# Patient Record
Sex: Male | Born: 1955 | Race: Black or African American | Hispanic: No | Marital: Married | Smoking: Current every day smoker
Health system: Southern US, Community
[De-identification: ages and names within clinical notes are randomized; demographics above are authoritative.]

## PROBLEM LIST (undated history)

## (undated) DIAGNOSIS — G8929 Other chronic pain: Secondary | ICD-10-CM

## (undated) DIAGNOSIS — M549 Dorsalgia, unspecified: Secondary | ICD-10-CM

## (undated) DIAGNOSIS — M4722 Other spondylosis with radiculopathy, cervical region: Secondary | ICD-10-CM

## (undated) DIAGNOSIS — I10 Essential (primary) hypertension: Secondary | ICD-10-CM

## (undated) DIAGNOSIS — K759 Inflammatory liver disease, unspecified: Secondary | ICD-10-CM

## (undated) DIAGNOSIS — M4712 Other spondylosis with myelopathy, cervical region: Secondary | ICD-10-CM

## (undated) DIAGNOSIS — R531 Weakness: Secondary | ICD-10-CM

## (undated) DIAGNOSIS — M2540 Effusion, unspecified joint: Secondary | ICD-10-CM

## (undated) HISTORY — PX: COLONOSCOPY: SHX174

---

## 2004-09-01 ENCOUNTER — Emergency Department (HOSPITAL_COMMUNITY): Admission: EM | Admit: 2004-09-01 | Discharge: 2004-09-01 | Payer: Self-pay | Admitting: Emergency Medicine

## 2007-01-07 ENCOUNTER — Ambulatory Visit: Payer: Self-pay | Admitting: Gastroenterology

## 2007-01-18 ENCOUNTER — Ambulatory Visit: Payer: Self-pay | Admitting: Gastroenterology

## 2009-09-25 HISTORY — PX: KNEE ARTHROSCOPY: SUR90

## 2009-09-25 HISTORY — PX: LUMBAR SPINE SURGERY: SHX701

## 2012-01-04 ENCOUNTER — Ambulatory Visit: Payer: BC Managed Care – PPO

## 2012-01-04 ENCOUNTER — Ambulatory Visit (INDEPENDENT_AMBULATORY_CARE_PROVIDER_SITE_OTHER): Payer: BC Managed Care – PPO | Admitting: Internal Medicine

## 2012-01-04 ENCOUNTER — Encounter: Payer: Self-pay | Admitting: Internal Medicine

## 2012-01-04 VITALS — BP 159/94 | HR 81 | Temp 98.3°F | Resp 18 | Ht 69.0 in | Wt 146.0 lb

## 2012-01-04 DIAGNOSIS — I10 Essential (primary) hypertension: Secondary | ICD-10-CM

## 2012-01-04 DIAGNOSIS — R030 Elevated blood-pressure reading, without diagnosis of hypertension: Secondary | ICD-10-CM

## 2012-01-04 DIAGNOSIS — F17200 Nicotine dependence, unspecified, uncomplicated: Secondary | ICD-10-CM | POA: Insufficient documentation

## 2012-01-04 DIAGNOSIS — Z0000 Encounter for general adult medical examination without abnormal findings: Secondary | ICD-10-CM

## 2012-01-04 DIAGNOSIS — J449 Chronic obstructive pulmonary disease, unspecified: Secondary | ICD-10-CM

## 2012-01-04 LAB — COMPREHENSIVE METABOLIC PANEL
AST: 34 U/L (ref 0–37)
Albumin: 4.3 g/dL (ref 3.5–5.2)
Alkaline Phosphatase: 57 U/L (ref 39–117)
BUN: 12 mg/dL (ref 6–23)
Potassium: 4.4 mEq/L (ref 3.5–5.3)
Sodium: 140 mEq/L (ref 135–145)

## 2012-01-04 LAB — POCT CBC
Lymph, poc: 1.8 (ref 0.6–3.4)
MCH, POC: 28.6 pg (ref 27–31.2)
MCHC: 32.8 g/dL (ref 31.8–35.4)
MID (cbc): 0.5 (ref 0–0.9)
MPV: 9.3 fL (ref 0–99.8)
POC MID %: 9.6 %M (ref 0–12)
Platelet Count, POC: 231 10*3/uL (ref 142–424)
RDW, POC: 14.1 %
WBC: 5.2 10*3/uL (ref 4.6–10.2)

## 2012-01-04 LAB — LIPID PANEL
Cholesterol: 160 mg/dL (ref 0–200)
Total CHOL/HDL Ratio: 1.9 Ratio
VLDL: 8 mg/dL (ref 0–40)

## 2012-01-04 LAB — POCT URINALYSIS DIPSTICK
Blood, UA: NEGATIVE
Glucose, UA: NEGATIVE
Leukocytes, UA: NEGATIVE
Nitrite, UA: NEGATIVE
Urobilinogen, UA: 0.2

## 2012-01-04 LAB — PULMONARY FUNCTION TEST

## 2012-01-04 LAB — POCT UA - MICROSCOPIC ONLY
Casts, Ur, LPF, POC: NEGATIVE
Yeast, UA: NEGATIVE

## 2012-01-04 MED ORDER — AMLODIPINE BESYLATE 10 MG PO TABS: 10.0000 mg | ORAL_TABLET | Freq: Every day | ORAL | Status: DC

## 2012-01-04 NOTE — Patient Instructions (Signed)
Chronic Obstructive Pulmonary Disease Chronic obstructive pulmonary disease (COPD) is a condition in which airflow from the lungs is restricted. The lungs can never return to normal, but there are measures you can take which will improve them and make you feel better. CAUSES   Smoking.   Exposure to secondhand smoke.   Breathing in irritants (pollution, cigarette smoke, strong smells, aerosol sprays, paint fumes).   History of lung infections.  TREATMENT  Treatment focuses on making you comfortable (supportive care). Your caregiver may prescribe medications (inhaled or pills) to help improve your breathing. HOME CARE INSTRUCTIONS   If you smoke, stop smoking.   Avoid exposure to smoke, chemicals, and fumes that aggravate your breathing.   Take antibiotic medicines as directed by your caregiver.   Avoid medicines that dry up your system and slow down the elimination of secretions (antihistamines and cough syrups). This decreases respiratory capacity and may lead to infections.   Drink enough water and fluids to keep your urine clear or pale yellow. This loosens secretions.   Use humidifiers at home and at your bedside if they do not make breathing difficult.   Receive all protective vaccines your caregiver suggests, especially pneumococcal and influenza.   Use home oxygen as suggested.   Stay active. Exercise and physical activity will help maintain your ability to do things you want to do.   Eat a healthy diet.  SEEK MEDICAL CARE IF:   You develop pus-like mucus (sputum).   Breathing is more labored or exercise becomes difficult to do.   You are running out of the medicine you take for your breathing.  SEEK IMMEDIATE MEDICAL CARE IF:   You have a rapid heart rate.   You have agitation, confusion, tremors, or are in a stupor (family members may need to observe this).   It becomes difficult to breathe.   You develop chest pain.   You have a fever.  MAKE SURE YOU:    Understand these instructions.   Will watch your condition.   Will get help right away if you are not doing well or get worse.  Document Released: 06/21/2005 Document Revised: 08/31/2011 Document Reviewed: 11/11/2010 [facility] Patient Information 2012 [place], [place].Chronic Obstructive Pulmonary Disease Chronic obstructive pulmonary disease (COPD) is a condition in which airflow from the lungs is restricted. The lungs can never return to normal, but there are measures you can take which will improve them and make you feel better. CAUSES   Smoking.   Exposure to secondhand smoke.   Breathing in irritants (pollution, cigarette smoke, strong smells, aerosol sprays, paint fumes).   History of lung infections.  TREATMENT  Treatment focuses on making you comfortable (supportive care). Your caregiver may prescribe medications (inhaled or pills) to help improve your breathing. HOME CARE INSTRUCTIONS   If you smoke, stop smoking.   Avoid exposure to smoke, chemicals, and fumes that aggravate your breathing.   Take antibiotic medicines as directed by your caregiver.   Avoid medicines that dry up your system and slow down the elimination of secretions (antihistamines and cough syrups). This decreases respiratory capacity and may lead to infections.   Drink enough water and fluids to keep your urine clear or pale yellow. This loosens secretions.   Use humidifiers at home and at your bedside if they do not make breathing difficult.   Receive all protective vaccines your caregiver suggests, especially pneumococcal and influenza.   Use home oxygen as suggested.   Stay active. Exercise and physical activity will  help maintain your ability to do things you want to do.   Eat a healthy diet.  SEEK MEDICAL CARE IF:   You develop pus-like mucus (sputum).   Breathing is more labored or exercise becomes difficult to do.   You are running out of the medicine you take for your breathing.   SEEK IMMEDIATE MEDICAL CARE IF:   You have a rapid heart rate.   You have agitation, confusion, tremors, or are in a stupor (family members may need to observe this).   It becomes difficult to breathe.   You develop chest pain.   You have a fever.  MAKE SURE YOU:   Understand these instructions.   Will watch your condition.   Will get help right away if you are not doing well or get worse.  Document Released: 06/21/2005 Document Revised: 08/31/2011 Document Reviewed: 11/11/2010 [facility] Patient Information 2012 [place], [place].DASH Diet The DASH diet stands for "Dietary Approaches to Stop Hypertension." It is a healthy eating plan that has been shown to reduce high blood pressure (hypertension) in as little as 14 days, while also possibly providing other significant health benefits. These other health benefits include reducing the risk of breast cancer after menopause and reducing the risk of type 2 diabetes, heart disease, colon cancer, and stroke. Health benefits also include weight loss and slowing kidney failure in patients with chronic kidney disease.  DIET GUIDELINES  Limit salt (sodium). Your diet should contain less than 1500 mg of sodium daily.   Limit refined or processed carbohydrates. Your diet should include mostly whole grains. Desserts and added sugars should be used sparingly.   Include small amounts of heart-healthy fats. These types of fats include nuts, oils, and tub margarine. Limit saturated and trans fats. These fats have been shown to be harmful in the body.  CHOOSING FOODS  The following food groups are based on a 2000 calorie diet. See your Registered Dietitian for individual calorie needs. Grains and Grain Products (6 to 8 servings daily)  Eat More Often: Whole-wheat bread, brown rice, whole-grain or wheat pasta, quinoa, popcorn without added fat or salt (air popped).   Eat Less Often: White bread, white pasta, white rice, cornbread.  Vegetables (4 to 5  servings daily)  Eat More Often: Fresh, frozen, and canned vegetables. Vegetables may be raw, steamed, roasted, or grilled with a minimal amount of fat.   Eat Less Often/Avoid: Creamed or fried vegetables. Vegetables in a cheese sauce.  Fruit (4 to 5 servings daily)  Eat More Often: All fresh, canned (in natural juice), or frozen fruits. Dried fruits without added sugar. One hundred percent fruit juice ( cup [237 mL] daily).   Eat Less Often: Dried fruits with added sugar. Canned fruit in light or heavy syrup.  [employer], Fish, and Poultry (2 servings or less daily. One serving is 3 to 4 oz [85-114 g]).  Eat More Often: Ninety percent or leaner ground beef, tenderloin, sirloin. Round cuts of beef, chicken breast, [place] breast. All fish. Grill, bake, or broil your meat. Nothing should be fried.   Eat Less Often/Avoid: Fatty cuts of meat, [place], or chicken leg, thigh, or wing. Fried cuts of meat or fish.  Dairy (2 to 3 servings)  Eat More Often: Low-fat or fat-free milk, low-fat plain or light yogurt, reduced-fat or part-skim cheese.   Eat Less Often/Avoid: Milk (whole, 2%, skim, or chocolate).Whole milk yogurt. Full-fat cheeses.  Nuts, Seeds, and Legumes (4 to 5 servings per week)  Eat  More Often: All without added salt.   Eat Less Often/Avoid: Salted nuts and seeds, canned beans with added salt.  Fats and Sweets (limited)  Eat More Often: Vegetable oils, tub margarines without trans fats, sugar-free gelatin. Mayonnaise and salad dressings.   Eat Less Often/Avoid: Coconut oils, palm oils, butter, stick margarine, cream, half and half, cookies, candy, pie.  FOR MORE INFORMATION The Dash Diet Eating Plan: www.dashdiet.org Document Released: 08/31/2011 Document Reviewed: 08/21/2011 [facility] Patient Information 2012 [place], [place].Hypertension As your heart beats, it forces blood through your arteries. This force is your blood pressure. If the pressure is too high, it is called  hypertension (HTN) or high blood pressure. HTN is dangerous because you may have it and not know it. High blood pressure may mean that your heart has to work harder to pump blood. Your arteries may be narrow or stiff. The extra work puts you at risk for heart disease, stroke, and other problems.  Blood pressure consists of two numbers, a higher number over a lower, 110/72, for example. It is stated as "110 over 72." The ideal is below 120 for the top number (systolic) and under 80 for the bottom (diastolic). Write down your blood pressure today. You should pay close attention to your blood pressure if you have certain conditions such as:  Heart failure.   Prior heart attack.   Diabetes   Chronic kidney disease.   Prior stroke.   Multiple risk factors for heart disease.  To see if you have HTN, your blood pressure should be measured while you are seated with your arm held at the level of the heart. It should be measured at least twice. A one-time elevated blood pressure reading (especially in the Emergency Department) does not mean that you need treatment. There may be conditions in which the blood pressure is different between your right and left arms. It is important to see your caregiver soon for a recheck. Most people have essential hypertension which means that there is not a specific cause. This type of high blood pressure may be lowered by changing lifestyle factors such as:  Stress.   Smoking.   Lack of exercise.   Excessive weight.   Drug/tobacco/alcohol use.   Eating less salt.  Most people do not have symptoms from high blood pressure until it has caused damage to the body. Effective treatment can often prevent, delay or reduce that damage. TREATMENT  When a cause has been identified, treatment for high blood pressure is directed at the cause. There are a large number of medications to treat HTN. These fall into several categories, and your caregiver will help you select the  medicines that are best for you. Medications may have side effects. You should review side effects with your caregiver. If your blood pressure stays high after you have made lifestyle changes or started on medicines,   Your medication(s) may need to be changed.   Other problems may need to be addressed.   Be certain you understand your prescriptions, and know how and when to take your medicine.   Be sure to follow up with your caregiver within the time frame advised (usually within two weeks) to have your blood pressure rechecked and to review your medications.   If you are taking more than one medicine to lower your blood pressure, make sure you know how and at what times they should be taken. Taking two medicines at the same time can result in blood pressure that is too low.  SEEK IMMEDIATE MEDICAL CARE IF:  You develop a severe headache, blurred or changing vision, or confusion.   You have unusual weakness or numbness, or a faint feeling.   You have severe chest or abdominal pain, vomiting, or breathing problems.  MAKE SURE YOU:   Understand these instructions.   Will watch your condition.   Will get help right away if you are not doing well or get worse.  Document Released: 09/11/2005 Document Revised: 08/31/2011 Document Reviewed: 05/01/2008 [facility] Patient Information 2012 [place], [place].

## 2012-01-04 NOTE — Progress Notes (Signed)
Subjective:    Patient ID: David Keith, male    DOB: [birth year], 56 y.o.   MRN: [id number]  HPI  56 y/o male with elevated BP and hx of smoking presents feeling great for CPE. Ready to quit smoking again. Jonts ache in many places but does not slow him down. See scanned hx  Review of Systems    see scanned ROS Objective:   Physical Exam  Constitutional: He is oriented to person, place, and time. He appears well-developed and well-nourished.  HENT:  Right Ear: External ear normal.  Left Ear: External ear normal.  Nose: Nose normal.  Eyes: EOM are normal. Pupils are equal, round, and reactive to light.  Neck: Normal range of motion. Neck supple. No thyromegaly present.  Cardiovascular: Normal rate, regular rhythm and normal heart sounds.   Pulmonary/Chest: Effort normal. Not tachypneic. No respiratory distress. He has decreased breath sounds. He has rhonchi.  Abdominal: Soft. Bowel sounds are normal. He exhibits no distension and no mass.  Genitourinary: Rectum normal, prostate normal and penis normal.  Musculoskeletal: He exhibits tenderness.  Lymphadenopathy:    He has no cervical adenopathy.  Neurological: He is alert and oriented to person, place, and time. He has normal reflexes. Coordination normal.  Skin: Skin is warm and dry.  Psychiatric: He has a normal mood and affect.   [facility] reading (PRIMARY) by  Dr.Travius Crochet hyperinflation Results for orders placed in visit on 01/04/12  POCT CBC      Component Value Range   WBC 5.2  4.6 - 10.2 (K/uL)   Lymph, poc 1.8  0.6 - 3.4    POC LYMPH PERCENT 34.0  10 - 50 (%L)   MID (cbc) 0.5  0 - 0.9    POC MID % 9.6  0 - 12 (%M)   POC Granulocyte 2.9  2 - 6.9    Granulocyte percent 56.4  37 - 80 (%G)   RBC 4.69  4.69 - 6.13 (M/uL)   Hemoglobin 13.4 (*) 14.1 - 18.1 (g/dL)   HCT, POC 32.4 (*) 40.1 - 53.7 (%)   MCV 86.9  80 - 97 (fL)   MCH, POC 28.6  27 - 31.2 (pg)   MCHC 32.8  31.8 - 35.4 (g/dL)   RDW, POC 02.7     Platelet  Count, POC 231  142 - 424 (K/uL)   MPV 9.3  0 - 99.8 (fL)  POCT UA - MICROSCOPIC ONLY      Component Value Range   WBC, Ur, HPF, POC 0-1     RBC, urine, microscopic 0-1     Bacteria, U Microscopic neg     Mucus, UA neg     Epithelial cells, urine per micros 0-1     Crystals, Ur, HPF, POC neg     Casts, Ur, LPF, POC neg     Yeast, UA neg    POCT URINALYSIS DIPSTICK      Component Value Range   Color, UA yellow     Clarity, UA clear     Glucose, UA neg     Bilirubin, UA neg     Ketones, UA neg     Spec Grav, UA 1.015     Blood, UA neg     pH, UA 5.5     Protein, UA neg     Urobilinogen, UA 0.2     Nitrite, UA neg     Leukocytes, UA Negative  Assessment & Plan:  HTN needs rx, will start Amlodipine 10mg  qd Quit smoking will use nicorrete COPD early

## 2012-01-21 ENCOUNTER — Ambulatory Visit (INDEPENDENT_AMBULATORY_CARE_PROVIDER_SITE_OTHER): Payer: BC Managed Care – PPO | Admitting: Family Medicine

## 2012-01-21 VITALS — BP 154/86 | HR 85 | Temp 98.6°F | Resp 16 | Ht 68.58 in | Wt 141.8 lb

## 2012-01-21 DIAGNOSIS — N529 Male erectile dysfunction, unspecified: Secondary | ICD-10-CM

## 2012-01-21 DIAGNOSIS — I10 Essential (primary) hypertension: Secondary | ICD-10-CM | POA: Insufficient documentation

## 2012-01-21 MED ORDER — TADALAFIL 20 MG PO TABS: 10.0000 mg | ORAL_TABLET | ORAL | Status: DC | PRN

## 2012-01-21 MED ORDER — LISINOPRIL 10 MG PO TABS: 10.0000 mg | ORAL_TABLET | Freq: Every day | ORAL | Status: DC

## 2012-01-21 NOTE — Progress Notes (Signed)
  Patient Name: David Keith Date of Birth: [birth year] Medical Record Number: [id number] Gender: male Date of Encounter: 01/21/2012  History of Present Illness:  David Keith is a 56 y.o. very pleasant male patient who presents with the following:  Here to recheck his BP today- see last OV 01/04/12.  He was started on norvasc 10mg  at that visit for newly diagnosedHTN.  He is feeling well so far.  He has not noted any swelling.  He does not check his BP at home, but is taking his medication daily He would like to try an ED medication as well.  He has no CP and does not use nitriglycerin.  Patient Active Problem List  Diagnoses  . Smoking  . Hypertension   No past medical history on file. No past surgical history on file. History  Substance Use Topics  . Smoking status: Current Everyday Smoker -- 1.0 packs/day for 25 years    Types: Cigarettes  . Smokeless tobacco: Not on file   Comment: 3 packs a week  . Alcohol Use: 0.0 oz/week     weekends only   No family history on file. No Known Allergies  Medication list has been reviewed and updated.  Review of Systems: As per HPI- otherwise negative.   Physical Examination: Filed Vitals:   01/21/12 0936  BP: 154/86  Pulse: 85  Temp: 98.6 F (37 C)  TempSrc: Oral  Resp: 16  Height: 5' 8.58" (1.742 m)  Weight: 141 lb 12.8 oz (64.32 kg)    Body mass index is 21.20 kg/(m^2).  GEN: WDWN, NAD, Non-toxic, A & O x 3 HEENT: Atraumatic, Normocephalic. Neck supple. No masses, No LAD. PEERL, oropharynx and TM wnl Ears and Nose: No external deformity. CV: RRR, No M/G/R. No JVD. No thrill. No extra heart sounds. PULM: CTA B, no wheezes, crackles, rhonchi. No retractions. No resp. distress. No accessory muscle use. ABD: S, NT, ND EXTR: No c/c/e NEURO Normal gait.  PSYCH: Normally interactive. Conversant. Not depressed or anxious appearing.  Calm demeanor.    Assessment and Plan: 1. Hypertension  lisinopril  (PRINIVIL,ZESTRIL) 10 MG tablet  2. ED (erectile dysfunction)  tadalafil (CIALIS) 20 MG tablet   BP better but still elevated.  Add lisinopril as above.  Also gave sample coupon for cialis 20mg - try 1/2 or 1 tablet as needed.  If he does well may continue or change to whatever medication is preferred by his insurance plan.  Please call and let us know how he is doing, plan to recheck BP in a couple of months

## 2012-02-17 ENCOUNTER — Telehealth: Payer: Self-pay

## 2012-02-17 NOTE — Telephone Encounter (Signed)
PATIENT STATES HE WAS TOLD TO CALL BACK FROM DR. COPLAND REGARDING HIS BLOOD PRESSURE READING. WHEN HE TOOK IT Friday, IT WAS 135/61 OR 135/81? HE WAS NOT SURE. BEST PHONE [phone]  PHARMACY CHOICE IS [facility] ([place])   MBC

## 2012-02-19 NOTE — Telephone Encounter (Signed)
Called and LMOM- it sounds like his BP control is fine.  Take care and please come see [place] in about one month for labs and BP recheck.

## 2012-05-11 ENCOUNTER — Telehealth: Payer: Self-pay

## 2012-05-11 NOTE — Telephone Encounter (Signed)
Patient states he was given 20 mg sample of Cialis and it is 'not doing the trick'. Patient would like a call back and perhaps a higher dose? [facility] [address] Veterinary surgeon.

## 2012-05-13 MED ORDER — SILDENAFIL CITRATE 100 MG PO TABS: 50.0000 mg | ORAL_TABLET | Freq: Every day | ORAL | Status: DC | PRN

## 2012-05-13 NOTE — Telephone Encounter (Signed)
That is the highest dose of Cialis. Has he had luck with any other medication? Viagra? Is the medication helping at all?

## 2012-05-13 NOTE — Telephone Encounter (Signed)
Rx for viagra sent to pharmacy. If this does not work for him, may need office visit to re-evaluate

## 2012-05-13 NOTE — Telephone Encounter (Signed)
I have called patient back to advise this is the only one of these medications he has tried, I have told him we may need to try a different one of these and send in please advise.

## 2012-05-14 NOTE — Telephone Encounter (Signed)
LMOM for pt to CB.  

## 2012-05-16 NOTE — Telephone Encounter (Signed)
Pt CB and notified him that Rx was sent in and if it is ineffective he should RTC for re-eval.

## 2012-05-16 NOTE — Telephone Encounter (Signed)
lmom that viagra called in and told him that if this doesn't work, he may need to rtc.

## 2012-12-24 ENCOUNTER — Encounter: Payer: Self-pay | Admitting: Internal Medicine

## 2013-03-22 ENCOUNTER — Other Ambulatory Visit: Payer: Self-pay | Admitting: Family Medicine

## 2013-03-22 ENCOUNTER — Other Ambulatory Visit: Payer: Self-pay | Admitting: Internal Medicine

## 2013-05-13 ENCOUNTER — Other Ambulatory Visit: Payer: Self-pay | Admitting: Physician Assistant

## 2013-08-22 ENCOUNTER — Emergency Department (HOSPITAL_COMMUNITY): Admission: EM | Admit: 2013-08-22 | Discharge: 2013-08-22 | Discharge status: Against Medical Advice | Payer: Self-pay

## 2013-08-22 ENCOUNTER — Other Ambulatory Visit: Payer: Self-pay

## 2013-09-23 ENCOUNTER — Ambulatory Visit (INDEPENDENT_AMBULATORY_CARE_PROVIDER_SITE_OTHER): Payer: BC Managed Care – PPO | Admitting: Emergency Medicine

## 2013-09-23 VITALS — BP 188/120 | HR 87 | Temp 98.1°F | Resp 16 | Ht 68.0 in | Wt 147.0 lb

## 2013-09-23 DIAGNOSIS — F17200 Nicotine dependence, unspecified, uncomplicated: Secondary | ICD-10-CM

## 2013-09-23 DIAGNOSIS — R490 Dysphonia: Secondary | ICD-10-CM

## 2013-09-23 DIAGNOSIS — I10 Essential (primary) hypertension: Secondary | ICD-10-CM

## 2013-09-23 DIAGNOSIS — Z9119 Patient's noncompliance with other medical treatment and regimen: Secondary | ICD-10-CM

## 2013-09-23 DIAGNOSIS — J029 Acute pharyngitis, unspecified: Secondary | ICD-10-CM

## 2013-09-23 MED ORDER — LISINOPRIL 20 MG PO TABS: 20.0000 mg | ORAL_TABLET | Freq: Every day | ORAL | Status: DC

## 2013-09-23 MED ORDER — AMLODIPINE BESYLATE 10 MG PO TABS: 10.0000 mg | ORAL_TABLET | Freq: Every day | ORAL | Status: DC

## 2013-09-23 NOTE — Patient Instructions (Signed)
STOP SMOKING!!!!!! Come back Saturday next week for blood pressure check and labs  Hoarseness Hoarseness is produced from a variety of causes. It is important to find the cause so it can be treated. In the absence of a cold or upper respiratory illness, any hoarseness lasting more than 2 weeks should be looked at by a specialist. This is especially important if you have a history of smoking or alcohol use. It is also important to keep in mind that as you grow older, your voice will naturally get weaker, making it easier for you to become hoarse from straining your vocal cords.  CAUSES  Any illness that affects your vocal cords can result in a hoarse voice. Examples of conditions that can affect the vocal cords are listed as follows:   Allergies.  Colds.  Sinusitis.  Gastroesophageal reflux disease.  Croup.  Injury.  Nodules.  Exposure to smoke or toxic fumes or gases.  Congenital and genetic defects.  Paralysis of the vocal cords.  Infections.  Advanced age. DIAGNOSIS  In order to diagnose the cause of your hoarseness, your caregiver will examine your throat using an instrument that uses a tube with a small lighted camera (laryngoscope). It allows your caregiver to look into the mouth and down the throat. TREATMENT  For most cases, treatment will focus on the specific cause of the hoarseness. Depending on the cause, hoarseness can be a temporary condition (acute) or it can be long lasting (chronic). Most cases of hoarseness clear up without complications. Your caregiver will explain to you if this is not likely to happen. SEEK IMMEDIATE MEDICAL CARE IF:   You have increasing hoarseness or loss of voice.  You have shortness of breath.  You are coughing up blood.  There is pain in your neck or throat. Document Released: 08/25/2005 Document Revised: 12/04/2011 Document Reviewed: 11/17/2010 [facility] Patient Information 2014 [place], [place]. Hypertension As your heart beats,  it forces blood through your arteries. This force is your blood pressure. If the pressure is too high, it is called hypertension (HTN) or high blood pressure. HTN is dangerous because you may have it and not know it. High blood pressure may mean that your heart has to work harder to pump blood. Your arteries may be narrow or stiff. The extra work puts you at risk for heart disease, stroke, and other problems.  Blood pressure consists of two numbers, a higher number over a lower, 110/72, for example. It is stated as "110 over 72." The ideal is below 120 for the top number (systolic) and under 80 for the bottom (diastolic). Write down your blood pressure today. You should pay close attention to your blood pressure if you have certain conditions such as:  Heart failure.  Prior heart attack.  Diabetes  Chronic kidney disease.  Prior stroke.  Multiple risk factors for heart disease. To see if you have HTN, your blood pressure should be measured while you are seated with your arm held at the level of the heart. It should be measured at least twice. A one-time elevated blood pressure reading (especially in the Emergency Department) does not mean that you need treatment. There may be conditions in which the blood pressure is different between your right and left arms. It is important to see your caregiver soon for a recheck. Most people have essential hypertension which means that there is not a specific cause. This type of high blood pressure may be lowered by changing lifestyle factors such as:  Stress.  Smoking.  Lack of exercise.  Excessive weight.  Drug/tobacco/alcohol use.  Eating less salt. Most people do not have symptoms from high blood pressure until it has caused damage to the body. Effective treatment can often prevent, delay or reduce that damage. TREATMENT  When a cause has been identified, treatment for high blood pressure is directed at the cause. There are a large number of  medications to treat HTN. These fall into several categories, and your caregiver will help you select the medicines that are best for you. Medications may have side effects. You should review side effects with your caregiver. If your blood pressure stays high after you have made lifestyle changes or started on medicines,   Your medication(s) may need to be changed.  Other problems may need to be addressed.  Be certain you understand your prescriptions, and know how and when to take your medicine.  Be sure to follow up with your caregiver within the time frame advised (usually within two weeks) to have your blood pressure rechecked and to review your medications.  If you are taking more than one medicine to lower your blood pressure, make sure you know how and at what times they should be taken. Taking two medicines at the same time can result in blood pressure that is too low. SEEK IMMEDIATE MEDICAL CARE IF:  You develop a severe headache, blurred or changing vision, or confusion.  You have unusual weakness or numbness, or a faint feeling.  You have severe chest or abdominal pain, vomiting, or breathing problems. MAKE SURE YOU:   Understand these instructions.  Will watch your condition.  Will get help right away if you are not doing well or get worse. Document Released: 09/11/2005 Document Revised: 12/04/2011 Document Reviewed: 05/01/2008 [facility] Patient Information 2014 [place], [place].

## 2013-09-23 NOTE — Progress Notes (Addendum)
[facility] [address], [place] [address] [phone]  Date:  09/23/2013   Name:  David Keith   DOB:  1956-08-03   MRN:  [id number]  PCP:  Tally Due, MD    Chief Complaint: Sore Throat and Medication Refill   History of Present Illness:  David Keith is a 57 y.o. very pleasant male patient who presents with the following:  Last took blood pressure medication in April.  Ran out.   Says he went to pharmacy and tried recently to get refill and told to come in.  Asymptomatic.  Still smoking but has "cut back".   Now here with sore throat. No coryza.  No wheezing or shortness of breath.  He claims it makes him cough from the dryness in his throat No fever or chills and a non productive cough.  Hoarse.  Started several week ago.  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  No improvement with over the counter medications or other home remedies. Denies other complaint or health concern today.   Patient Active Problem List   Diagnosis Date Noted  . Hypertension 01/21/2012  . Smoking 01/04/2012    History reviewed. No pertinent past medical history.  History reviewed. No pertinent past surgical history.  History  Substance Use Topics  . Smoking status: Current Every Day Smoker -- 1.00 packs/day for 25 years    Types: Cigarettes  . Smokeless tobacco: Not on file     Comment: 3 packs a week  . Alcohol Use: 0.0 oz/week     Comment: weekends only    Family History  Problem Relation Age of Onset  . Cancer Mother   . Heart disease Father   . Diabetes Brother     No Known Allergies  Medication list has been reviewed and updated.  Current Outpatient Prescriptions on File Prior to Visit  Medication Sig Dispense Refill  . amLODipine (NORVASC) 10 MG tablet TAKE 1 TABLET BY MOUTH DAILY  15 tablet  0  . lisinopril (PRINIVIL,ZESTRIL) 10 MG tablet TAKE 1 TABLET DAILY  15 tablet  0  . Multiple Vitamins-Minerals (MULTIVITAMIN PO)  Take 1 tablet by mouth daily.      . sildenafil (VIAGRA) 100 MG tablet Take 0.5-1 tablets (50-100 mg total) by mouth daily as needed for erectile dysfunction.  5 tablet  1  . tadalafil (CIALIS) 20 MG tablet Take 0.5-1 tablets (10-20 mg total) by mouth every other day as needed for erectile dysfunction.  3 tablet  11   No current facility-administered medications on file prior to visit.    Review of Systems:  As per HPI, otherwise negative.    Physical Examination: Filed Vitals:   09/23/13 1737  BP: 188/120  Pulse: 87  Temp: 98.1 F (36.7 C)  Resp: 16   Filed Vitals:   09/23/13 1737  Height: 5\' 8"  (1.727 m)  Weight: 147 lb (66.679 kg)   Body mass index is 22.36 kg/(m^2). Ideal Body Weight: Weight in (lb) to have BMI = 25: 164.1  GEN: WDWN, NAD, Non-toxic, A & O x 3 HEENT: Atraumatic, Normocephalic. Neck supple. No masses, No LAD. Ears and Nose: No external deformity. CV: RRR, No M/G/R. No JVD. No thrill. No extra heart sounds. PULM: CTA B, no wheezes, crackles, rhonchi. No retractions. No resp. distress. No accessory muscle use. ABD: S, NT, ND, +BS. No rebound. No HSM. EXTR: No c/c/e NEURO Normal gait.  PSYCH: Normally interactive.  Conversant. Not depressed or anxious appearing.  Calm demeanor.    Assessment and Plan: Hypertension Noncompliance Hoarseness Tobacco abuse Strep screen  Send to ENT  Signed,  Phillips Odor, MD   Results for orders placed in visit on 09/23/13  POCT RAPID STREP A (OFFICE)      Result Value Range   Rapid Strep A Screen Negative  Negative

## 2013-09-23 NOTE — Addendum Note (Signed)
Addended by: Carmelina Dane on: 09/23/2013 06:57 PM   Modules accepted: Orders

## 2013-10-04 ENCOUNTER — Ambulatory Visit (INDEPENDENT_AMBULATORY_CARE_PROVIDER_SITE_OTHER): Payer: BC Managed Care – PPO | Admitting: Emergency Medicine

## 2013-10-04 VITALS — BP 140/88 | HR 89 | Temp 98.5°F | Resp 12 | Ht 65.25 in | Wt 158.0 lb

## 2013-10-04 DIAGNOSIS — I10 Essential (primary) hypertension: Secondary | ICD-10-CM

## 2013-10-04 DIAGNOSIS — J449 Chronic obstructive pulmonary disease, unspecified: Secondary | ICD-10-CM

## 2013-10-04 DIAGNOSIS — R490 Dysphonia: Secondary | ICD-10-CM

## 2013-10-04 LAB — COMPREHENSIVE METABOLIC PANEL
ALK PHOS: 56 U/L (ref 39–117)
ALT: 24 U/L (ref 0–53)
AST: 31 U/L (ref 0–37)
Albumin: 4.2 g/dL (ref 3.5–5.2)
BILIRUBIN TOTAL: 0.5 mg/dL (ref 0.3–1.2)
BUN: 12 mg/dL (ref 6–23)
CO2: 30 mEq/L (ref 19–32)
CREATININE: 0.86 mg/dL (ref 0.50–1.35)
Calcium: 9.8 mg/dL (ref 8.4–10.5)
Chloride: 106 mEq/L (ref 96–112)
Glucose, Bld: 111 mg/dL — ABNORMAL HIGH (ref 70–99)
Potassium: 5 mEq/L (ref 3.5–5.3)
SODIUM: 140 meq/L (ref 135–145)
TOTAL PROTEIN: 6.8 g/dL (ref 6.0–8.3)

## 2013-10-04 LAB — LIPID PANEL
CHOL/HDL RATIO: 2.2 ratio
Cholesterol: 147 mg/dL (ref 0–200)
HDL: 67 mg/dL (ref >39)
LDL CALC: 71 mg/dL (ref 0–99)
TRIGLYCERIDES: 47 mg/dL (ref <150)
VLDL: 9 mg/dL (ref 0–40)

## 2013-10-04 LAB — POCT CBC
Granulocyte percent: 64.6 %G (ref 37–80)
HEMATOCRIT: 43.1 % — AB (ref 43.5–53.7)
Hemoglobin: 13.8 g/dL — AB (ref 14.1–18.1)
LYMPH, POC: 1.2 (ref 0.6–3.4)
MCH: 29.4 pg (ref 27–31.2)
MCHC: 32 g/dL (ref 31.8–35.4)
MCV: 91.8 fL (ref 80–97)
MID (cbc): 0.3 (ref 0–0.9)
MPV: 9.4 fL (ref 0–99.8)
POC Granulocyte: 2.8 (ref 2–6.9)
POC LYMPH %: 27.6 % (ref 10–50)
POC MID %: 7.8 %M (ref 0–12)
Platelet Count, POC: 220 10*3/uL (ref 142–424)
RBC: 4.7 M/uL (ref 4.69–6.13)
RDW, POC: 15.5 %
WBC: 4.4 10*3/uL — AB (ref 4.6–10.2)

## 2013-10-04 LAB — POCT UA - MICROSCOPIC ONLY
Bacteria, U Microscopic: NEGATIVE
CRYSTALS, UR, HPF, POC: NEGATIVE
Casts, Ur, LPF, POC: NEGATIVE
RBC, urine, microscopic: NEGATIVE
YEAST UA: NEGATIVE

## 2013-10-04 LAB — PSA: PSA: 0.14 ng/mL (ref <=4.00)

## 2013-10-04 NOTE — Patient Instructions (Signed)

## 2013-10-04 NOTE — Progress Notes (Signed)
[facility] [address], [place] [id number] [phone]  Date:  10/04/2013   Name:  David CamaraVincent G Keith   DOB:  08-Mar-1956   MRN:  [id number]  PCP:  Tally DueGUEST, CHRIS WARREN, MD    Chief Complaint: Follow-up   History of Present Illness:  David Keith is a 58 y.o. very pleasant male patient who presents with the following:  Comes in for BP check and labs.  Did not take medication this morning.  No end organ complaints.  Denies other complaint or health concern today.   Patient Active Problem List   Diagnosis Date Noted  . Hypertension 01/21/2012  . Smoking 01/04/2012    No past medical history on file.  No past surgical history on file.  History  Substance Use Topics  . Smoking status: Current Every Day Smoker -- 1.00 packs/day for 25 years    Types: Cigarettes  . Smokeless tobacco: Never Used     Comment: 3 packs a week  . Alcohol Use: 0.0 oz/week     Comment: weekends only    Family History  Problem Relation Age of Onset  . Cancer Mother   . Heart disease Father   . Diabetes Brother     No Known Allergies  Medication list has been reviewed and updated.  Current Outpatient Prescriptions on File Prior to Visit  Medication Sig Dispense Refill  . amLODipine (NORVASC) 10 MG tablet Take 1 tablet (10 mg total) by mouth daily.  30 tablet  0  . lisinopril (PRINIVIL,ZESTRIL) 20 MG tablet Take 1 tablet (20 mg total) by mouth daily.  30 tablet  0  . Multiple Vitamins-Minerals (MULTIVITAMIN PO) Take 1 tablet by mouth daily.      . sildenafil (VIAGRA) 100 MG tablet Take 0.5-1 tablets (50-100 mg total) by mouth daily as needed for erectile dysfunction.  5 tablet  1  . tadalafil (CIALIS) 20 MG tablet Take 0.5-1 tablets (10-20 mg total) by mouth every other day as needed for erectile dysfunction.  3 tablet  11   No current facility-administered medications on file prior to visit.    Review of Systems:  As per HPI, otherwise negative.     Physical Examination: Filed Vitals:   10/04/13 0814  BP: 140/88  Pulse: 89  Temp: 98.5 F (36.9 C)  Resp: 12   Filed Vitals:   10/04/13 0814  Height: 5' 5.25" (1.657 m)  Weight: 158 lb (71.668 kg)   Body mass index is 26.1 kg/(m^2). Ideal Body Weight: Weight in (lb) to have BMI = 25: 151.1  GEN: WDWN, NAD, Non-toxic, A & O x 3 HEENT: Atraumatic, Normocephalic. Neck supple. No masses, No LAD. Ears and Nose: No external deformity. CV: RRR, No M/G/R. No JVD. No thrill. No extra heart sounds. PULM: CTA B, no wheezes, crackles, rhonchi. No retractions. No resp. distress. No accessory muscle use. ABD: S, NT, ND, +BS. No rebound. No HSM. EXTR: No c/c/e NEURO Normal gait.  PSYCH: Normally interactive. Conversant. Not depressed or anxious appearing.  Calm demeanor.    Assessment and Plan: Hoarseness that he relates to dust exposure at work Hy[employer]   Signed,  Phillips OdorJeffery Oland Arquette, MD

## 2013-11-16 ENCOUNTER — Other Ambulatory Visit: Payer: Self-pay | Admitting: Emergency Medicine

## 2013-11-16 ENCOUNTER — Other Ambulatory Visit: Payer: Self-pay | Admitting: Physician Assistant

## 2013-11-17 NOTE — Telephone Encounter (Signed)
Dr Dareen PianoAnderson, do you want to RF this for pt?

## 2013-11-26 ENCOUNTER — Telehealth: Payer: Self-pay

## 2013-11-26 NOTE — Telephone Encounter (Signed)
Pt states he picked up his bp meds and his Viagra at the pharmacy and cannot locate them anywhere???  He states any messages must be left on his cell phone as he cannot receive calls on his job.   Best phone for pt is [phone]

## 2013-11-27 ENCOUNTER — Other Ambulatory Visit: Payer: Self-pay | Admitting: Emergency Medicine

## 2013-11-27 MED ORDER — SILDENAFIL CITRATE 100 MG PO TABS: ORAL_TABLET | ORAL | Status: DC

## 2013-11-27 MED ORDER — AMLODIPINE BESYLATE 10 MG PO TABS: ORAL_TABLET | ORAL | Status: DC

## 2013-11-27 NOTE — Telephone Encounter (Signed)
sent 

## 2013-11-27 NOTE — Telephone Encounter (Signed)
Spoke to patient states he would like a refill on his amlodipine and lisinopril, along with the viagra. Last BP med was taken on Thursday. Please advise.

## 2013-11-27 NOTE — Telephone Encounter (Signed)
Patient states he was speaking with someone today regarding his prescription. He states someone was supposed to be getting back with him. Please return his call.

## 2013-11-28 NOTE — Telephone Encounter (Signed)
Left message on machine that meds were sent to pharm.   [phone]

## 2014-05-25 ENCOUNTER — Other Ambulatory Visit: Payer: Self-pay | Admitting: Emergency Medicine

## 2014-06-24 ENCOUNTER — Other Ambulatory Visit: Payer: Self-pay | Admitting: Emergency Medicine

## 2014-07-29 ENCOUNTER — Ambulatory Visit (INDEPENDENT_AMBULATORY_CARE_PROVIDER_SITE_OTHER): Payer: BC Managed Care – PPO | Admitting: Sports Medicine

## 2014-07-29 VITALS — BP 132/84 | HR 95 | Temp 98.7°F | Resp 16 | Ht 69.0 in | Wt 154.0 lb

## 2014-07-29 DIAGNOSIS — S46092A Other injury of muscle(s) and tendon(s) of the rotator cuff of left shoulder, initial encounter: Secondary | ICD-10-CM

## 2014-07-29 MED ORDER — MELOXICAM 15 MG PO TABS: 15.0000 mg | ORAL_TABLET | Freq: Every day | ORAL | Status: DC

## 2014-07-29 NOTE — Progress Notes (Signed)
  David CamaraVincent G Keith - 58 y.o. male MRN [id number]  Date of birth: [birth year]  SUBJECTIVE:  Including CC & ROS.  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  Respiratory:  No shortness of breath, cough, or wheezing Cardiovascular:  No palpitations, chest pain or syncope Gastrointestinal:  No nausea, no abdominal pain Review of systems otherwise negative except for what is stated in HPI  HISTORY: Past Medical, Surgical, Social, and Family History Reviewed & Updated per EMR. Pertinent Historical Findings include: HTN Tobacco abuse  PHYSICAL EXAM:  VS: BP:132/84 mmHg  HR:95bpm  TEMP:98.7 F (37.1 C)(Oral)  RESP:97 %  HT:5\' 9"  (175.3 cm)   WT:154 lb (69.854 kg)  BMI:22.8 PHYSICAL EXAM: SHOULDER EXAM:  General: well nourished Skin of UE: warm; dry, no rashes, lesions, ecchymosis or erythema. Vascular: radial pulses 2+ bilaterally Neurologically: Normal sensation with no sensory or motor defects in C4-C8, bilateral Palpation: no tenderness over the [facility] joint, acromion, Significant bicipital grove tenderness, yes supraspinatus tenderness ROM active/passive: symmetric full 180 degree of abduction and forward flexion, symmetric internal (80-90) and external rotation (90) with shoulder at 90 abduction. Appley's scratch test equal bilaterally Strength testing: 5/5 symmetric strength in internal and external rotation, forward flexion, adduction and abduction  Special Test: positive Neer's, positive Hawkins, positive empty can, neg O'Brien, neg speeds, neg apprehension    ASSESSMENT & PLAN:  His is a 58 year old African American male that presented today with left shoulder pain. Based on patient's physical exam today with evidence of normal strength in the rotator cuff muscles do not suspect a rotator cuff muscle tear at this time although a degenerative partial tear is still possible. He has a positive Neer, positive Hawkins, positive empty can test indicating likely rotator cuff tendinopathy and impingement. 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 Eyes patient that since symptoms have only been present for a few days I recommended oral medication to calm down some inflammation and shoulder as well as physical therapy type exercises.   Plan: -Prescribed patient Mobic 15 mg daily for 2 weeks followed by when necessary -Provided patient with home exercise handout that include Jobbs exercises for the shoulder. - Advised patient that if symptoms do not improve or the next 2-3 weeks to present back to the office for reevaluation and consideration for subacromial space injection, and x-ray

## 2014-07-29 NOTE — Patient Instructions (Signed)
Rotator Cuff Injury Rotator cuff injury is any type of injury to the set of muscles and tendons that make up the stabilizing unit of your shoulder. This unit holds the ball of your upper arm bone (humerus) in the socket of your shoulder blade (scapula).  CAUSES Injuries to your rotator cuff most commonly come from sports or activities that cause your arm to be moved repeatedly over your head. Examples of this include throwing, weight lifting, swimming, or racquet sports. Long lasting (chronic) irritation of your rotator cuff can cause soreness and swelling (inflammation), bursitis, and eventual damage to your tendons, such as a tear (rupture). SIGNS AND SYMPTOMS Acute rotator cuff tear:  Sudden tearing sensation followed by severe pain shooting from your upper shoulder down your arm toward your elbow.  Decreased range of motion of your shoulder because of pain and muscle spasm.  Severe pain.  Inability to raise your arm out to the side because of pain and loss of muscle power (large tears). Chronic rotator cuff tear:  Pain that usually is worse at night and may interfere with sleep.  Gradual weakness and decreased shoulder motion as the pain worsens.  Decreased range of motion. Rotator cuff tendinitis:  Deep ache in your shoulder and the outside upper arm over your shoulder.  Pain that comes on gradually and becomes worse when lifting your arm to the side or turning it inward. DIAGNOSIS Rotator cuff injury is diagnosed through a medical history, physical exam, and imaging exam. The medical history helps determine the type of rotator cuff injury. Your health care provider will look at your injured shoulder, feel the injured area, and ask you to move your shoulder in different positions. X-ray exams typically are done to rule out other causes of shoulder pain, such as fractures. MRI is the exam of choice for the most severe shoulder injuries because the images show muscles and tendons.    TREATMENT  Chronic tear:  Medicine for pain, such as acetaminophen or ibuprofen.  Physical therapy and range-of-motion exercises may be helpful in maintaining shoulder function and strength.  Steroid injections into your shoulder joint.  Surgical repair of the rotator cuff if the injury does not heal with noninvasive treatment. Acute tear:  Anti-inflammatory medicines such as ibuprofen and naproxen to help reduce pain and swelling.  A sling to help support your arm and rest your rotator cuff muscles. Long-term use of a sling is not advised. It may cause significant stiffening of the shoulder joint.  Surgery may be considered within a few weeks, especially in younger, active people, to return the shoulder to full function.  Indications for surgical treatment include the following:  Age younger than 60 years.  Rotator cuff tears that are complete.  Physical therapy, rest, and anti-inflammatory medicines have been used for 6-8 weeks, with no improvement.  Employment or sporting activity that requires constant shoulder use. Tendinitis:  Anti-inflammatory medicines such as ibuprofen and naproxen to help reduce pain and swelling.  A sling to help support your arm and rest your rotator cuff muscles. Long-term use of a sling is not advised. It may cause significant stiffening of the shoulder joint.  Severe tendinitis may require:  Steroid injections into your shoulder joint.  Physical therapy.  Surgery. HOME CARE INSTRUCTIONS   Apply ice to your injury:  Put ice in a plastic bag.  Place a towel between your skin and the bag.  Leave the ice on for 20 minutes, 2-3 times a day.  If you   have a shoulder immobilizer (sling and straps), wear it until told otherwise by your health care provider.  You may want to sleep on several pillows or in a recliner at night to lessen swelling and pain.  Only take over-the-counter or prescription medicines for pain, discomfort, or fever as  directed by your health care provider.  Do simple hand squeezing exercises with a soft rubber ball to decrease hand swelling. SEEK MEDICAL CARE IF:   Your shoulder pain increases, or new pain or numbness develops in your arm, hand, or fingers.  Your hand or fingers are colder than your other hand. SEEK IMMEDIATE MEDICAL CARE IF:   Your arm, hand, or fingers are numb or tingling.  Your arm, hand, or fingers are increasingly swollen and painful, or they turn white or blue. MAKE SURE YOU:  Understand these instructions.  Will watch your condition.  Will get help right away if you are not doing well or get worse. Document Released: 09/08/2000 Document Revised: 09/16/2013 Document Reviewed: 04/23/2013 ExitCare Patient Information 2015 [employer]. This information is not intended to replace advice given to you by your health care provider. Make sure you discuss any questions you have with your health care provider.  

## 2014-08-16 ENCOUNTER — Other Ambulatory Visit: Payer: Self-pay | Admitting: Physician Assistant

## 2014-09-17 ENCOUNTER — Ambulatory Visit (INDEPENDENT_AMBULATORY_CARE_PROVIDER_SITE_OTHER): Payer: BC Managed Care – PPO | Admitting: Emergency Medicine

## 2014-09-17 ENCOUNTER — Ambulatory Visit (INDEPENDENT_AMBULATORY_CARE_PROVIDER_SITE_OTHER): Payer: BC Managed Care – PPO

## 2014-09-17 VITALS — BP 138/80 | HR 91 | Temp 98.3°F | Resp 18 | Ht 69.0 in | Wt 150.0 lb

## 2014-09-17 DIAGNOSIS — I10 Essential (primary) hypertension: Secondary | ICD-10-CM

## 2014-09-17 DIAGNOSIS — R200 Anesthesia of skin: Secondary | ICD-10-CM

## 2014-09-17 DIAGNOSIS — R208 Other disturbances of skin sensation: Secondary | ICD-10-CM

## 2014-09-17 DIAGNOSIS — M545 Low back pain: Secondary | ICD-10-CM

## 2014-09-17 MED ORDER — AMLODIPINE BESYLATE 10 MG PO TABS: ORAL_TABLET | ORAL | Status: DC

## 2014-09-17 MED ORDER — CYCLOBENZAPRINE HCL 10 MG PO TABS: 10.0000 mg | ORAL_TABLET | Freq: Three times a day (TID) | ORAL | Status: DC | PRN

## 2014-09-17 NOTE — Patient Instructions (Signed)
We will contact you regarding the appointment for the MRI.   You may use the flexeril at night.  It has sedating properties, so careful of use during the day. Use ibuprofen up to 2400mg  per day. Please return if your pain worsens.

## 2014-09-17 NOTE — Progress Notes (Signed)
MRN: [id number] DOB: 1956-07-30  Subjective:   Chief Complaint  Patient presents with  . Medication Refill    Amlopidine    David Keith is a 58 y.o. male presenting for medication refill and complaints of lower extremity numbness.    Lower extremity numbness has occurred for months.  It begins bilaterally just under his gluteus.  He then feels numbness down his legs through his toes.  Over time, this has progressively worsened where the sensation will occur within a shorter distance or time frame of sitting or standing.  He states that there is no change in skin temperature or pallor.  He is able to get some relief with forward bending or sitting.  He then will feel a cooling sensation radiate down his legs.  He denies any difficulty with bowel movements, incontinence, fever, or urinary symptoms.  He denies any trauma.  He has had lower lumbar pain in the past, but notes that the pain is very different and radiates from his back.    HTN: He reports being compliant with medication, though he has been without the amlodipine for 2 days.  He denies any chest pain, palpiations, or leg swellings.  He has good water hydration and eats more baked foods and vegetables.  BPs at home generally run around 130/80.  He does not exercise.  He works as a Museum/gallery exhibitions officerforklift driver. He plans to quit smoking in 3 months with the birth of his daughter's child.  The longest smoking cessation was for 10 years more than 27 years ago.  Today, he smokes about a pack every other day.      David Keith has a current medication list which includes the following prescription(s): amlodipine, amlodipine, meloxicam, multiple vitamins-minerals, sildenafil, lisinopril, and tadalafil.  He has No Known Allergies.  David Keith  has no past medical history on file. Also  has no past surgical history on file.  ROS As in subjective.  Objective:   Vitals: BP 138/80 mmHg  Pulse 91  Temp(Src) 98.3 F (36.8 C) (Oral)  Resp 18  Ht 5'  9" (1.753 m)  Wt 150 lb (68.04 kg)  BMI 22.14 kg/m2  SpO2 98%  Physical Exam  Cardiovascular: Normal rate, regular rhythm and normal pulses.  Exam reveals no gallop, no distant heart sounds and no friction rub.   No murmur heard. Pulses:      Dorsalis pedis pulses are 2+ on the right side, and 2+ on the left side.  Pulmonary/Chest: No accessory muscle usage. No apnea. No respiratory distress. He has no decreased breath sounds. He has no wheezes. He has no rhonchi.  Abdominal: Soft. Normal appearance. He exhibits no distension. There is no tenderness.  Neurological: He displays weakness. He displays normal speech. He exhibits normal muscle tone.  Reflex Scores:      Patellar reflexes are 0 on the right side and 2+ on the left side. Weakness with plantar flexion and dorsiflexion.  Skin:  Nomal monofilament exam.  1+ pitting edema.    Psychiatric: Mood, memory, affect and judgment normal.    Lumbar/Sacral/CoccyxXray [facility] reading (PRIMARY) by  Dr. Cleta Albertsaub. Spondylolisthesis at L4-L5 graded 1 to 2.      Assessment and Plan :  58 year old male is here today for medication refill and back pain.  This has been an ongoing problem that he addressed today.  This spondylolisthesis is likely causing the numbness.  We are referring him to have MRI, which will better indicate referral specifics.  Lower extremity numbness - Plan: DG Lumbar Spine Complete, DG Sacrum/Coccyx, cyclobenzaprine (FLEXERIL) 10 MG tablet, MR Lumbar Spine Wo Contrast, CANCELED: MR Lumbar Spine Wo Contrast  Bilateral low back pain, with sciatica presence unspecified - Plan: cyclobenzaprine (FLEXERIL) 10 MG tablet  Essential hypertension - Plan: amLODipine (NORVASC) 10 MG tablet Stable, he will return in January for physical exam, where annual labs will be obtained.    Plan discussed with Dr. Achilles Dunkaub  Sulo Janczak, PA-C [facility] 12/24/20151:41 PM

## 2014-10-03 ENCOUNTER — Ambulatory Visit
Admission: RE | Admit: 2014-10-03 | Discharge: 2014-10-03 | Disposition: A | Discharge status: Home / Self Care | Payer: BLUE CROSS/BLUE SHIELD | Source: Ambulatory Visit | Attending: Physician Assistant | Admitting: Physician Assistant

## 2014-10-05 ENCOUNTER — Other Ambulatory Visit: Payer: Self-pay | Admitting: Physician Assistant

## 2014-10-05 ENCOUNTER — Telehealth: Payer: Self-pay | Admitting: Physician Assistant

## 2014-10-05 DIAGNOSIS — R898 Other abnormal findings in specimens from other organs, systems and tissues: Secondary | ICD-10-CM

## 2014-10-05 DIAGNOSIS — N289 Disorder of kidney and ureter, unspecified: Secondary | ICD-10-CM

## 2014-10-05 DIAGNOSIS — M48061 Spinal stenosis, lumbar region without neurogenic claudication: Secondary | ICD-10-CM

## 2014-10-08 ENCOUNTER — Telehealth: Payer: Self-pay

## 2014-10-08 NOTE — Telephone Encounter (Signed)
Pt states he is returning our call about his mri results

## 2014-10-09 NOTE — Telephone Encounter (Signed)
Please review. Thanks!  

## 2014-10-10 NOTE — Telephone Encounter (Signed)
I have reviewed his results.  He has disc degeneration at multiple levels in his back.  The worst is at L4/L5 which is probably causing most of his problems.  He also has narrowing of the area where his nerves go into his legs which is also giving him a lot of his symptoms.  He needs to be evaluated by a neurosurgeon.  I will make the referral and also send this to [place] English and Dr Cleta Albertsaub so they knows the patient was contacted.

## 2014-10-10 NOTE — Telephone Encounter (Signed)
Pt notified of results

## 2014-10-10 NOTE — Telephone Encounter (Signed)
Pt called again wanting results.  Can we please review today?

## 2014-10-11 ENCOUNTER — Other Ambulatory Visit: Payer: Self-pay | Admitting: Physician Assistant

## 2014-10-11 NOTE — Telephone Encounter (Signed)
Can we please check on the referral for the patient.  His MRI is complete and the neurosurgeon may want to see that.  If he can not get into see Dr Lovell SheehanJenkins can we call the patient to see if he is willing to see someone else.

## 2014-10-11 NOTE — Telephone Encounter (Signed)
Thank you David Keith. I spoke with him also today.Do you know where these phone messages have been going. ??. Thanks for putting in the referral.

## 2014-10-12 NOTE — Telephone Encounter (Signed)
Spoke with Lupita LeashDonna, who is contacting Neurosurgery for update of referral.  I had spoke to wife, 10/05/2014, to have patient contact me directly during the week, as he has long fickle hours.  Should have better information by the end of the day regarding referral.

## 2014-11-09 ENCOUNTER — Other Ambulatory Visit: Payer: Self-pay | Admitting: Physician Assistant

## 2014-11-09 DIAGNOSIS — N289 Disorder of kidney and ureter, unspecified: Secondary | ICD-10-CM

## 2014-12-02 ENCOUNTER — Other Ambulatory Visit (HOSPITAL_COMMUNITY): Payer: Self-pay | Admitting: Neurosurgery

## 2015-01-04 ENCOUNTER — Encounter (HOSPITAL_COMMUNITY): Payer: Self-pay

## 2015-01-04 ENCOUNTER — Encounter (HOSPITAL_COMMUNITY)
Admission: RE | Admit: 2015-01-04 | Discharge: 2015-01-04 | Disposition: A | Discharge status: Home / Self Care | Payer: BLUE CROSS/BLUE SHIELD | Source: Ambulatory Visit | Attending: Neurosurgery | Admitting: Neurosurgery

## 2015-01-04 DIAGNOSIS — Z01812 Encounter for preprocedural laboratory examination: Secondary | ICD-10-CM | POA: Insufficient documentation

## 2015-01-04 DIAGNOSIS — Z01810 Encounter for preprocedural cardiovascular examination: Secondary | ICD-10-CM | POA: Diagnosis not present

## 2015-01-04 HISTORY — DX: Essential (primary) hypertension: I10

## 2015-01-04 LAB — BASIC METABOLIC PANEL
ANION GAP: 10 (ref 5–15)
BUN: 12 mg/dL (ref 6–23)
CALCIUM: 8.9 mg/dL (ref 8.4–10.5)
CO2: 24 mmol/L (ref 19–32)
CREATININE: 1.04 mg/dL (ref 0.50–1.35)
Chloride: 108 mmol/L (ref 96–112)
GFR calc Af Amer: 88 mL/min — ABNORMAL LOW (ref >90)
GFR, EST NON AFRICAN AMERICAN: 76 mL/min — AB (ref >90)
Glucose, Bld: 95 mg/dL (ref 70–99)
POTASSIUM: 3.9 mmol/L (ref 3.5–5.1)
Sodium: 142 mmol/L (ref 135–145)

## 2015-01-04 LAB — CBC
HCT: 41.6 % (ref 39.0–52.0)
Hemoglobin: 13.9 g/dL (ref 13.0–17.0)
MCH: 29 pg (ref 26.0–34.0)
MCHC: 33.4 g/dL (ref 30.0–36.0)
MCV: 86.8 fL (ref 78.0–100.0)
Platelets: 265 10*3/uL (ref 150–400)
RBC: 4.79 MIL/uL (ref 4.22–5.81)
RDW: 14.7 % (ref 11.5–15.5)
WBC: 4.6 10*3/uL (ref 4.0–10.5)

## 2015-01-04 LAB — SURGICAL PCR SCREEN
MRSA, PCR: NEGATIVE
Staphylococcus aureus: NEGATIVE

## 2015-01-04 LAB — NO BLOOD PRODUCTS

## 2015-01-04 NOTE — Progress Notes (Signed)
The orders for consent state for pt. To arrive [id number]. Surgery scheduled for 1404. Notified Vanessa at Dr. Lovell SheehanJenkins' office, she stated for pt. To arrive 2 hours prior to surgery.

## 2015-01-04 NOTE — Progress Notes (Signed)
Pt. Stating he doesn't want to receive blood products. He worhships at a [employer] church but has not been baptized there. States he is non denominational. Pt. Signed the refusal of blood products sheet but checked only albumin or albumin containing  Products.

## 2015-01-04 NOTE — Pre-Procedure Instructions (Signed)
David CamaraVincent G Keith  01/04/2015   Your procedure is scheduled on:  Wednesday, April 20  [facility] at 12:00 (noon)   Call this number if you have problems the morning of surgery: [phone]               If any questions prior to surgery call pre-admission [phone] (monday- Friday 8:00am-4:00pm)   Remember:   Do not eat food or drink liquids after midnight.   Take these medicines the morning of surgery with A SIP OF WATER: amlodipine (norvasc),             benadryl if needed              Stop aspirin, vitamins, or herbal medicines April 13             Stop taking any nonsteroidal anti-inflammatory drugs: advil, ibuprofen, motrin,naproxen   Do not wear jewelry, make-up or nail polish.  Do not wear lotions, powders, or perfumes. You may wear deodorant.  Do not shave 48 hours prior to surgery. Men may shave face and neck.  Do not bring valuables to the hospital.  [facility] is not responsible   for any belongings or valuables.               Contacts, dentures or bridgework may not be worn into surgery.  Leave suitcase in the car. After surgery it may be brought to your room.  For patients admitted to the hospital, discharge time is determined by your                treatment team.               Patients discharged the day of surgery will not be allowed to drive  home.  Name and phone number of your driver:   Special Instructions: review preparing for surgery handout   Please read over the following fact sheets that you were given: Pain Booklet, Coughing and Deep Breathing, Blood Transfusion Information, MRSA Information and Surgical Site Infection Prevention

## 2015-01-13 ENCOUNTER — Inpatient Hospital Stay (HOSPITAL_COMMUNITY): Admission: RE | Admit: 2015-01-13 | Payer: BLUE CROSS/BLUE SHIELD | Source: Ambulatory Visit | Admitting: Neurosurgery

## 2015-01-13 ENCOUNTER — Encounter (HOSPITAL_COMMUNITY): Admission: RE | Payer: Self-pay | Source: Ambulatory Visit

## 2015-01-13 SURGERY — POSTERIOR LUMBAR FUSION 1 LEVEL
Anesthesia: General

## 2015-02-25 ENCOUNTER — Other Ambulatory Visit: Payer: Self-pay | Admitting: Neurosurgery

## 2015-02-27 ENCOUNTER — Other Ambulatory Visit: Payer: Self-pay | Admitting: Physician Assistant

## 2015-03-09 ENCOUNTER — Encounter (HOSPITAL_COMMUNITY): Payer: Self-pay | Admitting: *Deleted

## 2015-03-09 NOTE — Progress Notes (Signed)
Denies ever having an echo/stress test/heart cath  No EKG or CXR in past yr

## 2015-03-10 ENCOUNTER — Encounter (HOSPITAL_COMMUNITY)
Admission: RE | Disposition: A | Discharge status: Home / Self Care | Payer: Self-pay | Source: Ambulatory Visit | Attending: Neurosurgery

## 2015-03-10 ENCOUNTER — Inpatient Hospital Stay (HOSPITAL_COMMUNITY)
Admission: RE | Admit: 2015-03-10 | Discharge: 2015-03-12 | DRG: 460 | Disposition: A | Discharge status: Home / Self Care | Payer: BLUE CROSS/BLUE SHIELD | Source: Ambulatory Visit | Attending: Neurosurgery | Admitting: Neurosurgery

## 2015-03-10 ENCOUNTER — Inpatient Hospital Stay (HOSPITAL_COMMUNITY): Payer: BLUE CROSS/BLUE SHIELD | Admitting: Anesthesiology

## 2015-03-10 ENCOUNTER — Inpatient Hospital Stay (HOSPITAL_COMMUNITY): Payer: BLUE CROSS/BLUE SHIELD

## 2015-03-10 ENCOUNTER — Encounter (HOSPITAL_COMMUNITY): Payer: Self-pay | Admitting: *Deleted

## 2015-03-10 DIAGNOSIS — Z833 Family history of diabetes mellitus: Secondary | ICD-10-CM | POA: Diagnosis not present

## 2015-03-10 DIAGNOSIS — M4316 Spondylolisthesis, lumbar region: Principal | ICD-10-CM | POA: Diagnosis present

## 2015-03-10 DIAGNOSIS — F17210 Nicotine dependence, cigarettes, uncomplicated: Secondary | ICD-10-CM | POA: Diagnosis present

## 2015-03-10 DIAGNOSIS — M4806 Spinal stenosis, lumbar region: Secondary | ICD-10-CM | POA: Diagnosis present

## 2015-03-10 DIAGNOSIS — Z79899 Other long term (current) drug therapy: Secondary | ICD-10-CM

## 2015-03-10 DIAGNOSIS — I10 Essential (primary) hypertension: Secondary | ICD-10-CM | POA: Diagnosis present

## 2015-03-10 DIAGNOSIS — Z8249 Family history of ischemic heart disease and other diseases of the circulatory system: Secondary | ICD-10-CM | POA: Diagnosis not present

## 2015-03-10 DIAGNOSIS — Z419 Encounter for procedure for purposes other than remedying health state, unspecified: Secondary | ICD-10-CM

## 2015-03-10 DIAGNOSIS — M545 Low back pain: Secondary | ICD-10-CM | POA: Diagnosis present

## 2015-03-10 DIAGNOSIS — M5116 Intervertebral disc disorders with radiculopathy, lumbar region: Secondary | ICD-10-CM | POA: Diagnosis present

## 2015-03-10 HISTORY — DX: Other chronic pain: G89.29

## 2015-03-10 HISTORY — DX: Weakness: R53.1

## 2015-03-10 HISTORY — DX: Effusion, unspecified joint: M25.40

## 2015-03-10 HISTORY — DX: Dorsalgia, unspecified: M54.9

## 2015-03-10 LAB — BASIC METABOLIC PANEL
Anion gap: 10 (ref 5–15)
BUN: 11 mg/dL (ref 6–20)
CHLORIDE: 104 mmol/L (ref 101–111)
CO2: 24 mmol/L (ref 22–32)
Calcium: 9 mg/dL (ref 8.9–10.3)
Creatinine, Ser: 0.97 mg/dL (ref 0.61–1.24)
GFR calc non Af Amer: >60 mL/min (ref >60)
Glucose, Bld: 105 mg/dL — ABNORMAL HIGH (ref 65–99)
Potassium: 3.9 mmol/L (ref 3.5–5.1)
Sodium: 138 mmol/L (ref 135–145)

## 2015-03-10 LAB — NO BLOOD PRODUCTS

## 2015-03-10 LAB — SURGICAL PCR SCREEN
MRSA, PCR: NEGATIVE
STAPHYLOCOCCUS AUREUS: NEGATIVE

## 2015-03-10 LAB — CBC
HCT: 40.7 % (ref 39.0–52.0)
Hemoglobin: 13.8 g/dL (ref 13.0–17.0)
MCH: 29.2 pg (ref 26.0–34.0)
MCHC: 33.9 g/dL (ref 30.0–36.0)
MCV: 86.2 fL (ref 78.0–100.0)
PLATELETS: 206 10*3/uL (ref 150–400)
RBC: 4.72 MIL/uL (ref 4.22–5.81)
RDW: 15.2 % (ref 11.5–15.5)
WBC: 4.1 10*3/uL (ref 4.0–10.5)

## 2015-03-10 SURGERY — POSTERIOR LUMBAR FUSION 1 LEVEL
Anesthesia: General | Site: Back

## 2015-03-10 MED ORDER — LIDOCAINE HCL (CARDIAC) 20 MG/ML IV SOLN
INTRAVENOUS | Status: AC
  Filled 2015-03-10: qty 5

## 2015-03-10 MED ORDER — ADULT MULTIVITAMIN W/MINERALS CH
1.0000 | ORAL_TABLET | Freq: Every day | ORAL | Status: DC
  Administered 2015-03-11 – 2015-03-12 (×2): 1 via ORAL
  Filled 2015-03-10 (×2): qty 1

## 2015-03-10 MED ORDER — HYDROMORPHONE HCL 1 MG/ML IJ SOLN
0.2500 mg | INTRAMUSCULAR | Status: DC | PRN
Start: 2015-03-10 — End: 2015-03-10
  Administered 2015-03-10 (×4): 0.5 mg via INTRAVENOUS

## 2015-03-10 MED ORDER — MENTHOL 3 MG MT LOZG: 1.0000 | LOZENGE | OROMUCOSAL | Status: DC | PRN

## 2015-03-10 MED ORDER — ACETAMINOPHEN 650 MG RE SUPP: 650.0000 mg | RECTAL | Status: DC | PRN

## 2015-03-10 MED ORDER — SURGIFOAM 100 EX MISC
CUTANEOUS | Status: DC | PRN
  Administered 2015-03-10: 15:00:00 via TOPICAL

## 2015-03-10 MED ORDER — ROCURONIUM BROMIDE 100 MG/10ML IV SOLN
INTRAVENOUS | Status: DC | PRN
  Administered 2015-03-10: 10 mg via INTRAVENOUS
  Administered 2015-03-10: 50 mg via INTRAVENOUS

## 2015-03-10 MED ORDER — BUPIVACAINE LIPOSOME 1.3 % IJ SUSP
INTRAMUSCULAR | Status: DC | PRN
  Administered 2015-03-10: 20 mL

## 2015-03-10 MED ORDER — VANCOMYCIN HCL 1000 MG IV SOLR
INTRAVENOUS | Status: DC | PRN
  Administered 2015-03-10: 1000 mg via TOPICAL

## 2015-03-10 MED ORDER — BUPIVACAINE LIPOSOME 1.3 % IJ SUSP
20.0000 mL | INTRAMUSCULAR | Status: AC
  Filled 2015-03-10: qty 20

## 2015-03-10 MED ORDER — LACTATED RINGERS IV SOLN
INTRAVENOUS | Status: DC
  Administered 2015-03-11: 01:00:00 via INTRAVENOUS

## 2015-03-10 MED ORDER — ONDANSETRON HCL 4 MG/2ML IJ SOLN: 4.0000 mg | Freq: Once | INTRAMUSCULAR | Status: DC | PRN

## 2015-03-10 MED ORDER — HYDROCODONE-ACETAMINOPHEN 5-325 MG PO TABS: 1.0000 | ORAL_TABLET | ORAL | Status: DC | PRN

## 2015-03-10 MED ORDER — MIDAZOLAM HCL 2 MG/2ML IJ SOLN
INTRAMUSCULAR | Status: AC
  Filled 2015-03-10: qty 2

## 2015-03-10 MED ORDER — LACTATED RINGERS IV SOLN
INTRAVENOUS | Status: DC | PRN
  Administered 2015-03-10 (×3): via INTRAVENOUS

## 2015-03-10 MED ORDER — DIAZEPAM 5 MG PO TABS
ORAL_TABLET | ORAL | Status: AC
  Filled 2015-03-10: qty 1

## 2015-03-10 MED ORDER — OXYCODONE HCL 5 MG/5ML PO SOLN: 5.0000 mg | Freq: Once | ORAL | Status: AC | PRN

## 2015-03-10 MED ORDER — HYDROMORPHONE HCL 1 MG/ML IJ SOLN
INTRAMUSCULAR | Status: AC
  Filled 2015-03-10: qty 1

## 2015-03-10 MED ORDER — SODIUM CHLORIDE 0.9 % IR SOLN
Status: DC | PRN
  Administered 2015-03-10: 15:00:00

## 2015-03-10 MED ORDER — CEFAZOLIN SODIUM-DEXTROSE 2-3 GM-% IV SOLR
INTRAVENOUS | Status: AC
  Filled 2015-03-10: qty 50

## 2015-03-10 MED ORDER — BACITRACIN ZINC 500 UNIT/GM EX OINT
TOPICAL_OINTMENT | CUTANEOUS | Status: DC | PRN
  Administered 2015-03-10: 1 via TOPICAL

## 2015-03-10 MED ORDER — GLYCOPYRROLATE 0.2 MG/ML IJ SOLN
INTRAMUSCULAR | Status: AC
Start: 2015-03-10 — End: 2015-03-10
  Filled 2015-03-10: qty 3

## 2015-03-10 MED ORDER — PROPOFOL 10 MG/ML IV BOLUS
INTRAVENOUS | Status: DC | PRN
  Administered 2015-03-10: 200 mg via INTRAVENOUS

## 2015-03-10 MED ORDER — VANCOMYCIN HCL 1000 MG IV SOLR
INTRAVENOUS | Status: AC
  Filled 2015-03-10: qty 1000

## 2015-03-10 MED ORDER — MUPIROCIN 2 % EX OINT
1.0000 "application " | TOPICAL_OINTMENT | Freq: Once | CUTANEOUS | Status: AC
  Administered 2015-03-10: 1 via TOPICAL

## 2015-03-10 MED ORDER — NEOSTIGMINE METHYLSULFATE 10 MG/10ML IV SOLN
INTRAVENOUS | Status: AC
  Filled 2015-03-10: qty 1

## 2015-03-10 MED ORDER — ONDANSETRON HCL 4 MG/2ML IJ SOLN
INTRAMUSCULAR | Status: DC | PRN
Start: 2015-03-10 — End: 2015-03-10
  Administered 2015-03-10: 4 mg via INTRAVENOUS

## 2015-03-10 MED ORDER — OXYCODONE HCL 5 MG PO TABS
5.0000 mg | ORAL_TABLET | Freq: Once | ORAL | Status: AC | PRN
  Administered 2015-03-10: 5 mg via ORAL

## 2015-03-10 MED ORDER — AMLODIPINE BESYLATE 10 MG PO TABS
10.0000 mg | ORAL_TABLET | Freq: Every day | ORAL | Status: DC
Start: 2015-03-11 — End: 2015-03-12
  Administered 2015-03-11 – 2015-03-12 (×2): 10 mg via ORAL
  Filled 2015-03-10 (×2): qty 1

## 2015-03-10 MED ORDER — ARTIFICIAL TEARS OP OINT
TOPICAL_OINTMENT | OPHTHALMIC | Status: AC
  Filled 2015-03-10: qty 3.5

## 2015-03-10 MED ORDER — DOCUSATE SODIUM 100 MG PO CAPS
100.0000 mg | ORAL_CAPSULE | Freq: Two times a day (BID) | ORAL | Status: DC
  Administered 2015-03-10 – 2015-03-12 (×4): 100 mg via ORAL
  Filled 2015-03-10 (×4): qty 1

## 2015-03-10 MED ORDER — ROCURONIUM BROMIDE 50 MG/5ML IV SOLN
INTRAVENOUS | Status: AC
  Filled 2015-03-10: qty 1

## 2015-03-10 MED ORDER — SUCCINYLCHOLINE CHLORIDE 20 MG/ML IJ SOLN
INTRAMUSCULAR | Status: AC
  Filled 2015-03-10: qty 1

## 2015-03-10 MED ORDER — CEFAZOLIN SODIUM-DEXTROSE 2-3 GM-% IV SOLR: 2.0000 g | INTRAVENOUS | Status: DC

## 2015-03-10 MED ORDER — 0.9 % SODIUM CHLORIDE (POUR BTL) OPTIME
TOPICAL | Status: DC | PRN
  Administered 2015-03-10: 1000 mL

## 2015-03-10 MED ORDER — EPHEDRINE SULFATE 50 MG/ML IJ SOLN
INTRAMUSCULAR | Status: AC
  Filled 2015-03-10: qty 1

## 2015-03-10 MED ORDER — BUPIVACAINE-EPINEPHRINE (PF) 0.5% -1:200000 IJ SOLN
INTRAMUSCULAR | Status: DC | PRN
  Administered 2015-03-10: 10 mL

## 2015-03-10 MED ORDER — CEFAZOLIN SODIUM-DEXTROSE 2-3 GM-% IV SOLR
2.0000 g | INTRAVENOUS | Status: AC
  Administered 2015-03-10: 2 g via INTRAVENOUS

## 2015-03-10 MED ORDER — OXYCODONE HCL 5 MG PO TABS
ORAL_TABLET | ORAL | Status: AC
  Filled 2015-03-10: qty 1

## 2015-03-10 MED ORDER — MORPHINE SULFATE 2 MG/ML IJ SOLN
1.0000 mg | INTRAMUSCULAR | Status: DC | PRN
  Administered 2015-03-10 – 2015-03-11 (×3): 2 mg via INTRAVENOUS
  Filled 2015-03-10 (×3): qty 1

## 2015-03-10 MED ORDER — NEOSTIGMINE METHYLSULFATE 10 MG/10ML IV SOLN
INTRAVENOUS | Status: DC | PRN
  Administered 2015-03-10: 4 mg via INTRAVENOUS

## 2015-03-10 MED ORDER — ONDANSETRON HCL 4 MG/2ML IJ SOLN
INTRAMUSCULAR | Status: AC
  Filled 2015-03-10: qty 2

## 2015-03-10 MED ORDER — ACETAMINOPHEN 325 MG PO TABS: 650.0000 mg | ORAL_TABLET | ORAL | Status: DC | PRN

## 2015-03-10 MED ORDER — PHENOL 1.4 % MT LIQD: 1.0000 | OROMUCOSAL | Status: DC | PRN

## 2015-03-10 MED ORDER — OXYCODONE-ACETAMINOPHEN 5-325 MG PO TABS
1.0000 | ORAL_TABLET | ORAL | Status: DC | PRN
  Administered 2015-03-10 – 2015-03-12 (×8): 2 via ORAL
  Filled 2015-03-10 (×9): qty 2

## 2015-03-10 MED ORDER — FENTANYL CITRATE (PF) 250 MCG/5ML IJ SOLN
INTRAMUSCULAR | Status: AC
  Filled 2015-03-10: qty 5

## 2015-03-10 MED ORDER — LACTATED RINGERS IV SOLN
INTRAVENOUS | Status: DC
  Administered 2015-03-10: 10 mL/h via INTRAVENOUS

## 2015-03-10 MED ORDER — ALUM & MAG HYDROXIDE-SIMETH 200-200-20 MG/5ML PO SUSP: 30.0000 mL | Freq: Four times a day (QID) | ORAL | Status: DC | PRN

## 2015-03-10 MED ORDER — BISACODYL 10 MG RE SUPP
10.0000 mg | Freq: Every day | RECTAL | Status: DC | PRN
Start: 2015-03-10 — End: 2015-03-12

## 2015-03-10 MED ORDER — CEFAZOLIN SODIUM-DEXTROSE 2-3 GM-% IV SOLR
2.0000 g | Freq: Three times a day (TID) | INTRAVENOUS | Status: AC
  Administered 2015-03-10 – 2015-03-11 (×2): 2 g via INTRAVENOUS
  Filled 2015-03-10 (×2): qty 50

## 2015-03-10 MED ORDER — FENTANYL CITRATE (PF) 100 MCG/2ML IJ SOLN
INTRAMUSCULAR | Status: DC | PRN
  Administered 2015-03-10: 100 ug via INTRAVENOUS
  Administered 2015-03-10: 50 ug via INTRAVENOUS
  Administered 2015-03-10: 150 ug via INTRAVENOUS

## 2015-03-10 MED ORDER — DIAZEPAM 5 MG PO TABS
5.0000 mg | ORAL_TABLET | Freq: Four times a day (QID) | ORAL | Status: DC | PRN
  Administered 2015-03-10 – 2015-03-12 (×4): 5 mg via ORAL
  Filled 2015-03-10 (×3): qty 1

## 2015-03-10 MED ORDER — ONDANSETRON HCL 4 MG/2ML IJ SOLN: 4.0000 mg | INTRAMUSCULAR | Status: DC | PRN

## 2015-03-10 MED ORDER — GLYCOPYRROLATE 0.2 MG/ML IJ SOLN
INTRAMUSCULAR | Status: DC | PRN
  Administered 2015-03-10: .7 mg via INTRAVENOUS

## 2015-03-10 MED ORDER — MIDAZOLAM HCL 5 MG/5ML IJ SOLN
INTRAMUSCULAR | Status: DC | PRN
  Administered 2015-03-10: 2 mg via INTRAVENOUS

## 2015-03-10 MED ORDER — MUPIROCIN 2 % EX OINT
TOPICAL_OINTMENT | CUTANEOUS | Status: AC
  Filled 2015-03-10: qty 22

## 2015-03-10 SURGICAL SUPPLY — 69 items
APL SKNCLS STERI-STRIP NONHPOA (GAUZE/BANDAGES/DRESSINGS) ×1
BAG DECANTER FOR FLEXI CONT (MISCELLANEOUS) ×3 IMPLANT
BENZOIN TINCTURE PRP APPL 2/3 (GAUZE/BANDAGES/DRESSINGS) ×3 IMPLANT
BLADE CLIPPER SURG (BLADE) IMPLANT
BRUSH SCRUB EZ PLAIN DRY (MISCELLANEOUS) ×3 IMPLANT
BUR MATCHSTICK NEURO 3.0 LAGG (BURR) ×3 IMPLANT
BUR PRECISION FLUTE 6.0 (BURR) ×3 IMPLANT
CANISTER SUCT 3000ML PPV (MISCELLANEOUS) ×3 IMPLANT
CAP REVERE LOCKING (Cap) ×8 IMPLANT
CLOSURE WOUND 1/2 X4 (GAUZE/BANDAGES/DRESSINGS) ×2
CONT SPEC 4OZ CLIKSEAL STRL BL (MISCELLANEOUS) ×3 IMPLANT
COVER BACK TABLE 60X90IN (DRAPES) ×3 IMPLANT
DRAPE C-ARM 42X72 X-RAY (DRAPES) ×6 IMPLANT
DRAPE LAPAROTOMY 100X72X124 (DRAPES) ×3 IMPLANT
DRAPE POUCH INSTRU U-SHP 10X18 (DRAPES) ×3 IMPLANT
DRAPE PROXIMA HALF (DRAPES) ×3 IMPLANT
DRAPE SURG 17X23 STRL (DRAPES) ×12 IMPLANT
ELECT BLADE 4.0 EZ CLEAN MEGAD (MISCELLANEOUS) ×6
ELECT REM PT RETURN 9FT ADLT (ELECTROSURGICAL) ×3
ELECTRODE BLDE 4.0 EZ CLN MEGD (MISCELLANEOUS) ×1 IMPLANT
ELECTRODE REM PT RTRN 9FT ADLT (ELECTROSURGICAL) ×1 IMPLANT
EVACUATOR 1/8 PVC DRAIN (DRAIN) ×2 IMPLANT
GAUZE SPONGE 4X4 12PLY STRL (GAUZE/BANDAGES/DRESSINGS) ×3 IMPLANT
GAUZE SPONGE 4X4 16PLY XRAY LF (GAUZE/BANDAGES/DRESSINGS) ×1 IMPLANT
GLOVE BIO SURGEON STRL SZ8 (GLOVE) ×8 IMPLANT
GLOVE BIO SURGEON STRL SZ8.5 (GLOVE) ×6 IMPLANT
GLOVE BIOGEL PI IND STRL 7.5 (GLOVE) IMPLANT
GLOVE BIOGEL PI INDICATOR 7.5 (GLOVE) ×4
GLOVE EXAM NITRILE LRG STRL (GLOVE) IMPLANT
GLOVE EXAM NITRILE MD LF STRL (GLOVE) IMPLANT
GLOVE EXAM NITRILE XL STR (GLOVE) IMPLANT
GLOVE EXAM NITRILE XS STR PU (GLOVE) IMPLANT
GLOVE SURG SS PI 7.0 STRL IVOR (GLOVE) ×8 IMPLANT
GOWN STRL REUS W/ TWL LRG LVL3 (GOWN DISPOSABLE) IMPLANT
GOWN STRL REUS W/ TWL XL LVL3 (GOWN DISPOSABLE) ×2 IMPLANT
GOWN STRL REUS W/TWL 2XL LVL3 (GOWN DISPOSABLE) IMPLANT
GOWN STRL REUS W/TWL LRG LVL3 (GOWN DISPOSABLE)
GOWN STRL REUS W/TWL XL LVL3 (GOWN DISPOSABLE) ×15
KIT BASIN OR (CUSTOM PROCEDURE TRAY) ×3 IMPLANT
KIT ROOM TURNOVER OR (KITS) ×3 IMPLANT
NDL HYPO 21X1.5 SAFETY (NEEDLE) IMPLANT
NEEDLE HYPO 21X1.5 SAFETY (NEEDLE) ×3 IMPLANT
NEEDLE HYPO 22GX1.5 SAFETY (NEEDLE) ×3 IMPLANT
NS IRRIG 1000ML POUR BTL (IV SOLUTION) ×3 IMPLANT
PACK LAMINECTOMY NEURO (CUSTOM PROCEDURE TRAY) ×3 IMPLANT
PAD ARMBOARD 7.5X6 YLW CONV (MISCELLANEOUS) ×9 IMPLANT
PATTIES SURGICAL .5 X.5 (GAUZE/BANDAGES/DRESSINGS) ×2 IMPLANT
PATTIES SURGICAL .5 X1 (DISPOSABLE) IMPLANT
PENCIL BUTTON HOLSTER BLD 10FT (ELECTRODE) ×2 IMPLANT
ROD REVERE 6.35 45MM (Rod) ×4 IMPLANT
SCREW 7.5X50MM (Screw) ×6 IMPLANT
SCREW REVERE 6.35 6.5X55MM (Screw) ×2 IMPLANT
SPACER ALTERA 10X31 10-14-8 (Spacer) ×2 IMPLANT
SPONGE LAP 4X18 X RAY DECT (DISPOSABLE) IMPLANT
SPONGE NEURO XRAY DETECT 1X3 (DISPOSABLE) IMPLANT
SPONGE SURGIFOAM ABS GEL 100 (HEMOSTASIS) ×3 IMPLANT
STRIP BIOACTIVE 20CC 25X100X8 (Miscellaneous) ×2 IMPLANT
STRIP CLOSURE SKIN 1/2X4 (GAUZE/BANDAGES/DRESSINGS) ×3 IMPLANT
SUT VIC AB 1 CT1 18XBRD ANBCTR (SUTURE) ×2 IMPLANT
SUT VIC AB 1 CT1 8-18 (SUTURE) ×6
SUT VIC AB 2-0 CP2 18 (SUTURE) ×6 IMPLANT
SYR 20CC LL (SYRINGE) ×2 IMPLANT
SYR 20ML ECCENTRIC (SYRINGE) ×3 IMPLANT
TAPE CLOTH SURG 4X10 WHT LF (GAUZE/BANDAGES/DRESSINGS) ×2 IMPLANT
TOWEL OR 17X24 6PK STRL BLUE (TOWEL DISPOSABLE) ×3 IMPLANT
TOWEL OR 17X26 10 PK STRL BLUE (TOWEL DISPOSABLE) ×3 IMPLANT
TRAY FOLEY CATH 16FRSI W/METER (SET/KITS/TRAYS/PACK) ×2 IMPLANT
TRAY FOLEY W/METER SILVER 14FR (SET/KITS/TRAYS/PACK) ×1 IMPLANT
WATER STERILE IRR 1000ML POUR (IV SOLUTION) ×3 IMPLANT

## 2015-03-10 NOTE — Progress Notes (Signed)
Pt received alert, verbal with no noted distress. Pt stable, neuro intact moving all extremities. Pt oriented to room. Safety measures in place. Call bell within reach. Gauze dressing to back, clean, dry and intact. Hemovac in place. Foley patent draining clear, yellow urine. Will continue to monitor.

## 2015-03-10 NOTE — Transfer of Care (Signed)
Immediate Anesthesia Transfer of Care Note  Patient: David Keith  Procedure(s) Performed: Procedure(s) with comments: Lumbar three-four and Lumbar four-five laminectomies with Lumbar four-five posterior lumbar interbody fusion with interbody prosthesis posterior lateral arthrodesis and posterior nonsegmental instrumentation (N/A) - Lumbar three-four and Lumbar four-five laminectomies with Lumbar four-five posterior lumbar interbody fusion with interbody prosthesis posterior lateral arthrodesis and posterior nonsegmental instrumentation  Patient Location: PACU  Anesthesia Type:General  Level of Consciousness: awake, alert  and oriented  Airway & Oxygen Therapy: Patient Spontanous Breathing and Patient connected to nasal cannula oxygen  Post-op Assessment: Report given to RN and Post -op Vital signs reviewed and stable  Post vital signs: Reviewed and stable  Last Vitals:  Filed Vitals:   03/10/15 1040  BP: 177/89  Pulse: 89  Temp: 36.8 C  Resp: 18    Complications: No apparent anesthesia complications

## 2015-03-10 NOTE — Progress Notes (Signed)
Notified Dr. Noreene Larsson that patient had 3 cups of black coffee this morning between 630-8am (last at 8am).  Will continue prepping patient for surgery.

## 2015-03-10 NOTE — Anesthesia Procedure Notes (Signed)
Procedure Name: Intubation Date/Time: 03/10/2015 2:04 PM Performed by: Eligha Bridegroom Pre-anesthesia Checklist: Patient identified, Timeout performed, Emergency Drugs available, Suction available and Patient being monitored Patient Re-evaluated:Patient Re-evaluated prior to inductionOxygen Delivery Method: Circle system utilized Preoxygenation: Pre-oxygenation with 100% oxygen Intubation Type: IV induction Ventilation: Mask ventilation without difficulty Laryngoscope Size: Mac and 4 Grade View: Grade II Tube type: Oral Tube size: 8.0 mm Number of attempts: 1 Airway Equipment and Method: Stylet and LTA kit utilized Placement Confirmation: ETT inserted through vocal cords under direct vision,  breath sounds checked- equal and bilateral and positive ETCO2 Secured at: 22 cm Tube secured with: Tape Dental Injury: Teeth and Oropharynx as per pre-operative assessment

## 2015-03-10 NOTE — H&P (Signed)
Subjective: The patient is a 59 year old black male who has complained of back, buttock, and leg pain consistent with neurogenic claudication. He has failed medical management and was worked up with a lumbar x-rays and a lumbar MRI. These demonstrated an L3-4 and L4-5 spinal stenosis with an L4-5 spondylolisthesis. I discussed the various treatment options with the patient including surgery. He has weighed the risks, benefits, and alternative surgery and decided to proceed with a L3-4 and L4-5 laminectomy with an L4-5 fusion.   Past Medical History  Diagnosis Date  . Hypertension     takes AMlodipine daily  . Weakness     numbness in both legs when walking  . Chronic back pain     radiculopathy and stenosis  . Joint swelling     right knee    Past Surgical History  Procedure Laterality Date  . Knee arthroscopy Right 2011  . Colonoscopy      Allergies  Allergen Reactions  . Flexeril [Cyclobenzaprine] Other (See Comments)    Gives him weird thoughts  . Other     NO BLOOD PRODUCTS. (pt will accept Albumin)    History  Substance Use Topics  . Smoking status: Current Every Day Smoker -- 1.00 packs/day for 20 years    Types: Cigarettes  . Smokeless tobacco: Never Used     Comment: 3 packs a week  . Alcohol Use: 0.0 oz/week     Comment: weekends only    Family History  Problem Relation Age of Onset  . Cancer Mother   . Heart disease Father   . Diabetes Brother    Prior to Admission medications   Medication Sig Start Date End Date Taking? Authorizing Provider  amLODipine (NORVASC) 10 MG tablet Take 1 tablet (10 mg total) by mouth daily. PATIENT NEEDS OFFICE VISIT FOR ADDITIONAL REFILLS 03/01/15  Yes Collene Gobble, MD  Multiple Vitamin (MULTIVITAMIN WITH MINERALS) TABS tablet Take 1 tablet by mouth daily.   Yes Historical Provider, MD  amLODipine (NORVASC) 10 MG tablet TAKE 1 TABLET BY MOUTH EVERY DAY.  OFFICE VISIT NEEDED FOR ADDITIONAL REFILLS Patient not taking: Reported on  03/08/2015 09/17/14   Collene Gobble, MD  cyclobenzaprine (FLEXERIL) 10 MG tablet Take 1 tablet (10 mg total) by mouth 3 (three) times daily as needed for muscle spasms. Patient not taking: Reported on 01/01/2015 09/17/14   Collie Siad English, PA  lisinopril (PRINIVIL,ZESTRIL) 20 MG tablet Take 1 tablet (20 mg total) by mouth daily. Patient not taking: Reported on 09/17/2014 09/23/13   Carmelina Dane, MD  meloxicam (MOBIC) 15 MG tablet Take 1 tablet (15 mg total) by mouth daily. Patient not taking: Reported on 01/01/2015 07/29/14   Deanna M Didiano, DO  sildenafil (VIAGRA) 100 MG tablet TAKE 1/2 TO 1 TABLET BY MOUTH DAILY AS NEEDED FOR ERECTILE DYSFUNCTION Patient taking differently: Take 50 mg by mouth daily as needed for erectile dysfunction. TAKE 1/2 TO 1 TABLET BY MOUTH DAILY AS NEEDED FOR ERECTILE DYSFUNCTION 11/27/13   Carmelina Dane, MD     Review of Systems  Positive ROS: As above  All other systems have been reviewed and were otherwise negative with the exception of those mentioned in the HPI and as above.  Objective: Vital signs in last 24 hours: Temp:  [98.2 F (36.8 C)] 98.2 F (36.8 C) (06/15 1040) Pulse Rate:  [89] 89 (06/15 1040) Resp:  [18] 18 (06/15 1040) BP: (177)/(89) 177/89 mmHg (06/15 1040) SpO2:  [100 %] 100 % (  06/15 1040) Weight:  [66.679 kg (147 lb)-69.4 kg (153 lb)] 69.4 kg (153 lb) (06/15 1040)  General Appearance: Alert, cooperative, no distress, Head: Normocephalic, without obvious abnormality, atraumatic Eyes: PERRL, conjunctiva/corneas clear, EOM's intact,    Ears: Normal  Throat: Normal  Neck: Supple, symmetrical, trachea midline, no adenopathy; thyroid: No enlargement/tenderness/nodules; no carotid bruit or JVD Back: Symmetric, no curvature, ROM normal, no CVA tenderness Lungs: Clear to auscultation bilaterally, respirations unlabored Heart: Regular rate and rhythm, no murmur, rub or gallop Abdomen: Soft, non-tender,, no masses, no  organomegaly Extremities: Extremities normal, atraumatic, no cyanosis or edema Pulses: 2+ and symmetric all extremities Skin: Skin color, texture, turgor normal, no rashes or lesions  NEUROLOGIC:   Mental status: alert and oriented, no aphasia, good attention span, Fund of knowledge/ memory ok Motor Exam - grossly normal Sensory Exam - grossly normal Reflexes:  Coordination - grossly normal Gait - grossly normal Balance - grossly normal Cranial Nerves: I: smell Not tested  II: visual acuity  OS: Normal  OD: Normal   II: visual fields Full to confrontation  II: pupils Equal, round, reactive to light  III,VII: ptosis None  III,IV,VI: extraocular muscles  Full ROM  V: mastication Normal  V: facial light touch sensation  Normal  V,VII: corneal reflex  Present  VII: facial muscle function - upper  Normal  VII: facial muscle function - lower Normal  VIII: hearing Not tested  IX: soft palate elevation  Normal  IX,X: gag reflex Present  XI: trapezius strength  5/5  XI: sternocleidomastoid strength 5/5  XI: neck flexion strength  5/5  XII: tongue strength  Normal    Data Review Lab Results  Component Value Date   WBC 4.1 03/10/2015   HGB 13.8 03/10/2015   HCT 40.7 03/10/2015   MCV 86.2 03/10/2015   PLT 206 03/10/2015   Lab Results  Component Value Date   NA 138 03/10/2015   K 3.9 03/10/2015   CL 104 03/10/2015   CO2 24 03/10/2015   BUN 11 03/10/2015   CREATININE 0.97 03/10/2015   GLUCOSE 105* 03/10/2015   No results found for: INR, PROTIME  Assessment/Plan: L3-4 and L4-5 spinal stenosis, L4-5 spondylolisthesis, lumbago, lumbar radiculopathy, neurogenic claudication: I have discussed the situation with the patient. I have reviewed his imaging studies with him and pointed out the abnormalities. We have discussed the various treatment options including surgery. I described the surgical treatment option and L3-4 and L4-5 laminectomy with L4-5 instrumentation and fusion. I  have shown him surgical models. We have discussed the risks, benefits, alternatives, and likelihood of achieving our goals with surgery. I have answered all the patient's questions. He has decided to proceed with surgery.   Drishti Pepperman D 03/10/2015 1:03 PM

## 2015-03-10 NOTE — Op Note (Signed)
Brief history: The patient is a 59 year old black male who has complained of back, buttock, and leg pain consistent with neurogenic claudication. He has failed medical management and was worked up with a lumbar MRI which demonstrated spinal stenosis he has spondylolisthesis at L4-5 as well. I discussed the various treatment options with the patient including surgery. He has weighed the risks, benefits, and alternative surgery and decided to proceed with a L3-4 and L4-5 decompression and fusion.  Preoperative diagnosis: L3-4 and L4-5 spinal stenosis, L4-5 spondylolisthesis, Degenerative disc disease, spinal stenosis compressing both the L4 and the L5 nerve roots; lumbago; lumbar radiculopathy  Postoperative diagnosis: As above  Procedure: L4 laminectomy with bilateral L3 Laminotomy/foraminotomies to decompress the bilateral L4 and L5 nerve roots(the work required to do this was in addition to the work required to do the posterior lumbar interbody fusion because of the patient's spinal stenosis, facet arthropathy. Etc. requiring a wide decompression of the nerve roots.); L4-5 posterior lumbar interbody fusion with local morselized autograft bone and Kinnex graft extender; insertion of interbody prosthesis at L4-5 (globus peek expandable interbody prosthesis); posterior nonsegmental instrumentation from L4 to L5 with globus titanium pedicle screws and rods; posterior lateral arthrodesis at L4-5 with local morselized autograft bone and Kinnex bone graft extender.  Surgeon: Dr. Delma Officer  Asst.: Dr. Marikay Alar  Anesthesia: Gen. endotracheal  Estimated blood loss: 200 mL  Drains: One medium Hemovac  Complications: None  Description of procedure: The patient was brought to the operating room by the anesthesia team. General endotracheal anesthesia was induced. The patient was turned to the prone position on the Wilson frame. The patient's lumbosacral region was then prepared with Betadine scrub and  Betadine solution. Sterile drapes were applied.  I then injected the area to be incised with Marcaine with epinephrine solution. I then used the scalpel to make a linear midline incision over the L3-4 and L4-5 interspace. I then used electrocautery to perform a bilateral subperiosteal dissection exposing the spinous process and lamina of L3, L4 and L5. We then obtained intraoperative radiograph to confirm our location. We then inserted the Verstrac retractor to provide exposure.  I began the decompression by using the high speed drill to perform laminotomies at L3 and L4 bilaterally. We then used the Kerrison punches to widen the laminotomy and removed the ligamentum flavum at L3-4 and L4-5. We used the Kerrison punches to remove the medial facets at L4-5. We performed wide foraminotomies about the bilateral L4 and L5 nerve roots completing the decompression.  We now turned our attention to the posterior lumbar interbody fusion. I used a scalpel to incise the intervertebral disc at L4-5 bilaterally. I then performed a partial intervertebral discectomy at L4-5 bilaterally using the pituitary forceps. We prepared the vertebral endplates at L4-5 bilaterally for the fusion by removing the soft tissues with the curettes. We then used the trial spacers to pick the appropriate sized interbody prosthesis. We prefilled his prosthesis with a combination of local morselized autograft bone that we obtained during the decompression as well as Kinnex bone graft extender. We inserted the prefilled prosthesis into the interspace at L4-5, and then we expanded the interbody prosthesis. There was a good snug fit of the prosthesis in the interspace. We then filled and the remainder of the intervertebral disc space with local morselized autograft bone and Kinnex. This completed the posterior lumbar interbody arthrodesis.  We now turned attention to the instrumentation. Under fluoroscopic guidance we cannulated the bilateral L4  and L5 pedicles  with the bone probe. We then removed the bone probe. We then tapped the pedicle with a 6.5 millimeter tap. We then removed the tap. We probed inside the tapped pedicle with a ball probe to rule out cortical breaches. We then inserted a 6.5 and 7.5 x 50 millimeter pedicle screw into the L4 and L5 pedicles bilaterally under fluoroscopic guidance. We then palpated along the medial aspect of the pedicles to rule out cortical breaches. There were none. The nerve roots were not injured. We then connected the unilateral pedicle screws with a lordotic rod. We compressed the construct and secured the rod in place with the caps. We then tightened the caps appropriately. This completed the instrumentation from L4-5.  We now turned our attention to the posterior lateral arthrodesis at L4-5. We used the high-speed drill to decorticate the remainder of the facets, pars, transverse process at L4-5. We then applied a combination of local morselized autograft bone and Kinnex bone graft extender over these decorticated posterior lateral structures. This completed the posterior lateral arthrodesis.  We then obtained hemostasis using bipolar electrocautery. We irrigated the wound out with bacitracin solution. We inspected the thecal sac and nerve roots and noted they were well decompressed. We then removed the retractor. I placed vancomycin powder in the wound. We placed a medium Hemovac drain in the epidural space and tunneled out through separate stab wound. We reapproximated patient's thoracolumbar fascia with interrupted #1 Vicryl suture. We reapproximated patient's subcutaneous tissue with interrupted 2-0 Vicryl suture. The reapproximated patient's skin with Steri-Strips and benzoin. The wound was then coated with bacitracin ointment. A sterile dressing was applied. The drapes were removed. The patient was subsequently returned to the supine position where they were extubated by the anesthesia team. He was then  transported to the post anesthesia care unit in stable condition. All sponge instrument and needle counts were reportedly correct at the end of this case.

## 2015-03-10 NOTE — Progress Notes (Signed)
Subjective:  The patient is in no apparent distress. He is alert and pleasant. He looks well.  Objective: Vital signs in last 24 hours: Temp:  [98.2 F (36.8 C)] 98.2 F (36.8 C) (06/15 1040) Pulse Rate:  [84-89] 84 (06/15 1729) Resp:  [17-18] 17 (06/15 1729) BP: (177)/(89) 177/89 mmHg (06/15 1040) SpO2:  [98 %-100 %] 98 % (06/15 1729) Weight:  [69.4 kg (153 lb)] 69.4 kg (153 lb) (06/15 1040)  Intake/Output from previous day:   Intake/Output this shift: Total I/O In: 2200 [I.V.:2200] Out: 515 [Urine:215; Blood:300]  Physical exam the patient is alert and moving all 4 extremities well.  Lab Results:  Recent Labs  03/10/15 1052  WBC 4.1  HGB 13.8  HCT 40.7  PLT 206   BMET  Recent Labs  03/10/15 1052  NA 138  K 3.9  CL 104  CO2 24  GLUCOSE 105*  BUN 11  CREATININE 0.97  CALCIUM 9.0    Studies/Results: Dg Lumbar Spine 2-3 Views  03/10/2015   CLINICAL DATA:  Lumbar disc protrusion.  Severe spinal stenosis.  EXAM: LUMBAR SPINE - 2-3 VIEW; DG C-ARM 61-120 MIN  FLUOROSCOPY TIME:  0 minutes 29 seconds  COMPARISON:  MRI dated 10/03/2014  FINDINGS: AP and lateral C-arm images demonstrate the patient undergoing interbody and posterior fusion at L4-5. Subluxation has been eliminated.  IMPRESSION: Fusion performed at L4-5.   Electronically Signed   By: Francene Boyers M.D.   On: 03/10/2015 17:27   Dg Lumbar Spine 1 View  03/10/2015   CLINICAL DATA:  Spinal stenosis and disc protrusion.  EXAM: LUMBAR SPINE - 1 VIEW  COMPARISON:  MRI dated 10/03/2014  FINDINGS: Image 1 demonstrates an instrument at the L4-5 level. Grade 1 spondylolisthesis at L4-5.  IMPRESSION: Instrument at L4-5.   Electronically Signed   By: Francene Boyers M.D.   On: 03/10/2015 17:29   Dg C-arm 1-60 Min  03/10/2015   CLINICAL DATA:  Lumbar disc protrusion.  Severe spinal stenosis.  EXAM: LUMBAR SPINE - 2-3 VIEW; DG C-ARM 61-120 MIN  FLUOROSCOPY TIME:  0 minutes 29 seconds  COMPARISON:  MRI dated 10/03/2014   FINDINGS: AP and lateral C-arm images demonstrate the patient undergoing interbody and posterior fusion at L4-5. Subluxation has been eliminated.  IMPRESSION: Fusion performed at L4-5.   Electronically Signed   By: Francene Boyers M.D.   On: 03/10/2015 17:27    Assessment/Plan: The patient is doing well.  LOS: 0 days     Cordarryl Monrreal D 03/10/2015, 5:37 PM

## 2015-03-11 LAB — CBC
HCT: 33.7 % — ABNORMAL LOW (ref 39.0–52.0)
Hemoglobin: 11.5 g/dL — ABNORMAL LOW (ref 13.0–17.0)
MCH: 29.4 pg (ref 26.0–34.0)
MCHC: 34.1 g/dL (ref 30.0–36.0)
MCV: 86.2 fL (ref 78.0–100.0)
Platelets: 193 10*3/uL (ref 150–400)
RBC: 3.91 MIL/uL — AB (ref 4.22–5.81)
RDW: 15.4 % (ref 11.5–15.5)
WBC: 5.8 10*3/uL (ref 4.0–10.5)

## 2015-03-11 LAB — BASIC METABOLIC PANEL
ANION GAP: 8 (ref 5–15)
BUN: 6 mg/dL (ref 6–20)
CALCIUM: 8.2 mg/dL — AB (ref 8.9–10.3)
CHLORIDE: 101 mmol/L (ref 101–111)
CO2: 31 mmol/L (ref 22–32)
Creatinine, Ser: 0.93 mg/dL (ref 0.61–1.24)
Glucose, Bld: 113 mg/dL — ABNORMAL HIGH (ref 65–99)
Potassium: 3.9 mmol/L (ref 3.5–5.1)
Sodium: 140 mmol/L (ref 135–145)

## 2015-03-11 NOTE — Anesthesia Postprocedure Evaluation (Signed)
  Anesthesia Post-op Note  Patient: David Keith  Procedure(s) Performed: Procedure(s) with comments: Lumbar three-four and Lumbar four-five laminectomies with Lumbar four-five posterior lumbar interbody fusion with interbody prosthesis posterior lateral arthrodesis and posterior nonsegmental instrumentation (N/A) - Lumbar three-four and Lumbar four-five laminectomies with Lumbar four-five posterior lumbar interbody fusion with interbody prosthesis posterior lateral arthrodesis and posterior nonsegmental instrumentation  Patient Location: PACU  Anesthesia Type:General  Level of Consciousness: awake, alert  and oriented  Airway and Oxygen Therapy: Patient Spontanous Breathing and Patient connected to nasal cannula oxygen  Post-op Pain: mild  Post-op Assessment: Post-op Vital signs reviewed, Patient's Cardiovascular Status Stable, Respiratory Function Stable, Patent Airway and Pain level controlled LLE Motor Response: Purposeful movement LLE Sensation: Numbness (pt states he had numbness before surgery ) RLE Motor Response: Purposeful movement RLE Sensation: Numbness (pt states he had numbness before surgery)      Post-op Vital Signs: stable  Last Vitals:  Filed Vitals:   03/11/15 0623  BP: 112/63  Pulse: 79  Temp: 37.1 C  Resp: 20    Complications: No apparent complications

## 2015-03-11 NOTE — Evaluation (Signed)
Physical Therapy Evaluation Patient Details Name: David Keith MRN: [id number] DOB: [birth year] Today's Date: 03/11/2015   History of Present Illness  59 y.o. male s/p Lumbar three-four and Lumbar four-five laminectomies with Lumbar four-five posterior lumbar interbody fusion   Clinical Impression  Patient is s/p above surgery presenting with functional limitations due to the deficits listed below (see PT Problem List). No loss of balance or buckling of LEs noted while ambulating with rolling walker for support. Reviewed proper use of lumbar corset, back precautions, and safety with mobility. Will need to be able to safely navigate stairs prior to d/c. Will follow up tomorrow for training of this task.Patient will benefit from skilled PT to increase their independence and safety with mobility to allow discharge to the venue listed below.         Follow Up Recommendations No PT follow up;Supervision/Assistance - 24 hour (initially 24 hr supervision)    Equipment Recommendations   (TBD based on progression)    Recommendations for Other Services OT consult     Precautions / Restrictions Precautions Precautions: Back Precaution Booklet Issued: Yes (comment) Precaution Comments: reviewed Required Braces or Orthoses: Spinal Brace Spinal Brace: Lumbar corset Restrictions Weight Bearing Restrictions: No      Mobility  Bed Mobility Overal bed mobility: Needs Assistance Bed Mobility: Rolling;Sidelying to Sit Rolling: Supervision Sidelying to sit: Supervision       General bed mobility comments: supervision for safety. VC for technique. No physical assist required for log roll technique  Transfers Overall transfer level: Needs assistance Equipment used: Rolling walker (2 wheeled) Transfers: Sit to/from Stand Sit to Stand: Min guard         General transfer comment: Min guard for safety. hips mildly flexed upon standing. VC for hand placement and upright  posture.  Ambulation/Gait Ambulation/Gait assistance: Min guard Ambulation Distance (Feet): 525 Feet Assistive device: Rolling walker (2 wheeled) Gait Pattern/deviations: Step-through pattern;Decreased stride length;Trunk flexed Gait velocity: decreased   General Gait Details: min guard for safety with intermittent cues for upright posture with forward gaze. Cues for glute activation. No buckling or loss of balance. Towards end of bout pt able to very lightly touch RW for support.  Stairs            Wheelchair Mobility    Modified Rankin (Stroke Patients Only)       Balance Overall balance assessment: Needs assistance Sitting-balance support: No upper extremity supported;Feet supported Sitting balance-Leahy Scale: Good     Standing balance support: No upper extremity supported Standing balance-Leahy Scale: Fair                               Pertinent Vitals/Pain Pain Assessment: 0-10 Pain Score: 7  Pain Location: back Pain Descriptors / Indicators: Aching Pain Intervention(s): Monitored during session;Repositioned    Home Living Family/patient expects to be discharged to:: Private residence Living Arrangements: Spouse/significant other Available Help at Discharge: Family;Available 24 hours/day Type of Home: Apartment Home Access: Stairs to enter Entrance Stairs-Rails: Right;Left Entrance Stairs-Number of Steps: 3 + 8 + 8 Home Layout: One level Home Equipment: Crutches      Prior Function Level of Independence: Independent               Hand Dominance   Dominant Hand: Right    Extremity/Trunk Assessment   Upper Extremity Assessment: Defer to OT evaluation           Lower Extremity Assessment:  RLE deficits/detail;LLE deficits/detail RLE Deficits / Details: BIL light touch deficit of feet. Reports improvement of dysesthesias of posterior LEs. Strength WFL LLE Deficits / Details: BIL light touch deficit of feet. Reports  improvement of dysesthesias of posterior LEs. Strength WFL     Communication   Communication: No difficulties  Cognition Arousal/Alertness: Awake/alert Behavior During Therapy: WFL for tasks assessed/performed Overall Cognitive Status: Within Functional Limits for tasks assessed                      General Comments General comments (skin integrity, edema, etc.): Reviewed back precautions, safety with mobility, and use of lumbar corset.    Exercises        Assessment/Plan    PT Assessment Patient needs continued PT services  PT Diagnosis Abnormality of gait;Acute pain;Difficulty walking   PT Problem List Decreased range of motion;Decreased activity tolerance;Decreased balance;Decreased mobility;Decreased knowledge of use of DME;Decreased knowledge of precautions;Impaired sensation;Pain  PT Treatment Interventions DME instruction;Gait training;Stair training;Functional mobility training;Therapeutic activities;Therapeutic exercise;Balance training;Neuromuscular re-education;Patient/family education;Modalities   PT Goals (Current goals can be found in the Care Plan section) Acute Rehab PT Goals Patient Stated Goal: go home PT Goal Formulation: With patient Time For Goal Achievement: 03/25/15 Potential to Achieve Goals: Good    Frequency Min 5X/week   Barriers to discharge        Co-evaluation               End of Session Equipment Utilized During Treatment: Back brace Activity Tolerance: Patient tolerated treatment well Patient left: in chair;with call bell/phone within reach Nurse Communication: Mobility status         Time: 1003-1027 PT Time Calculation (min) (ACUTE ONLY): 24 min   Charges:   PT Evaluation $Initial PT Evaluation Tier I: 1 Procedure PT Treatments $Gait Training: 8-22 mins   PT G CodesBerton Mount 03/11/2015, 11:05 AM Charlsie Merles, PT [phone]

## 2015-03-11 NOTE — Progress Notes (Signed)
Patient ID: David Keith, male   DOB: [birth year], 59 y.o.   MRN: [id number] Subjective:  The patient is alert and pleasant. He is in no apparent distress. He looks well.  Objective: Vital signs in last 24 hours: Temp:  [98 F (36.7 C)-98.8 F (37.1 C)] 98.8 F (37.1 C) (06/16 0623) Pulse Rate:  [71-89] 79 (06/16 0623) Resp:  [11-20] 20 (06/16 0623) BP: (107-177)/(63-103) 112/63 mmHg (06/16 0623) SpO2:  [96 %-100 %] 98 % (06/16 0623) Weight:  [69.4 kg (153 lb)] 69.4 kg (153 lb) (06/15 1040)  Intake/Output from previous day: 06/15 0701 - 06/16 0700 In: 2460 [P.O.:60; I.V.:2400] Out: 2685 [Urine:2140; Drains:245; Blood:300] Intake/Output this shift:    Physical exam the patient is alert and oriented. His strength is normal in his lower extremities.  Lab Results:  Recent Labs  03/10/15 1052  WBC 4.1  HGB 13.8  HCT 40.7  PLT 206   BMET  Recent Labs  03/10/15 1052  NA 138  K 3.9  CL 104  CO2 24  GLUCOSE 105*  BUN 11  CREATININE 0.97  CALCIUM 9.0    Studies/Results: Dg Lumbar Spine 2-3 Views  03/10/2015   CLINICAL DATA:  Lumbar disc protrusion.  Severe spinal stenosis.  EXAM: LUMBAR SPINE - 2-3 VIEW; DG C-ARM 61-120 MIN  FLUOROSCOPY TIME:  0 minutes 29 seconds  COMPARISON:  MRI dated 10/03/2014  FINDINGS: AP and lateral C-arm images demonstrate the patient undergoing interbody and posterior fusion at L4-5. Subluxation has been eliminated.  IMPRESSION: Fusion performed at L4-5.   Electronically Signed   By: Francene Boyers M.D.   On: 03/10/2015 17:27   Dg Lumbar Spine 1 View  03/10/2015   CLINICAL DATA:  Spinal stenosis and disc protrusion.  EXAM: LUMBAR SPINE - 1 VIEW  COMPARISON:  MRI dated 10/03/2014  FINDINGS: Image 1 demonstrates an instrument at the L4-5 level. Grade 1 spondylolisthesis at L4-5.  IMPRESSION: Instrument at L4-5.   Electronically Signed   By: Francene Boyers M.D.   On: 03/10/2015 17:29   Dg C-arm 1-60 Min  03/10/2015   CLINICAL DATA:  Lumbar  disc protrusion.  Severe spinal stenosis.  EXAM: LUMBAR SPINE - 2-3 VIEW; DG C-ARM 61-120 MIN  FLUOROSCOPY TIME:  0 minutes 29 seconds  COMPARISON:  MRI dated 10/03/2014  FINDINGS: AP and lateral C-arm images demonstrate the patient undergoing interbody and posterior fusion at L4-5. Subluxation has been eliminated.  IMPRESSION: Fusion performed at L4-5.   Electronically Signed   By: Francene Boyers M.D.   On: 03/10/2015 17:27    Assessment/Plan: Postop day #1: The patient is doing well. We will mobilize him with PT. He'll likely go home over the weekend.  LOS: 1 day     Nana Vastine D 03/11/2015, 7:39 AM

## 2015-03-11 NOTE — Progress Notes (Signed)
RN assessed pt's hemovac not charging completely. Will continue to monitor.  Estanislado Emms, RN

## 2015-03-11 NOTE — Anesthesia Preprocedure Evaluation (Signed)
Anesthesia Evaluation  Patient identified by MRN, date of birth, ID band Patient awake    Reviewed: Allergy & Precautions, NPO status , Patient's Chart, lab work & pertinent test results  Airway Mallampati: II  TM Distance: >3 FB Neck ROM: Full    Dental  (+) Teeth Intact, Dental Advisory Given   Pulmonary Current Smoker,    breath sounds clear to auscultation       Cardiovascular hypertension,  Rhythm:Regular Rate:Normal     Neuro/Psych    GI/Hepatic   Endo/Other    Renal/GU      Musculoskeletal   Abdominal   Peds  Hematology   Anesthesia Other Findings   Reproductive/Obstetrics                           Anesthesia Physical Anesthesia Plan  ASA: II  Anesthesia Plan: General   Post-op Pain Management:    Induction: Intravenous  Airway Management Planned: Oral ETT  Additional Equipment:   Intra-op Plan:   Post-operative Plan: Extubation in OR  Informed Consent: I have reviewed the patients History and Physical, chart, labs and discussed the procedure including the risks, benefits and alternatives for the proposed anesthesia with the patient or authorized representative who has indicated his/her understanding and acceptance.   Dental advisory given  Plan Discussed with: CRNA and Anesthesiologist  Anesthesia Plan Comments:         Anesthesia Quick Evaluation  

## 2015-03-12 MED ORDER — DIAZEPAM 5 MG PO TABS: 5.0000 mg | ORAL_TABLET | Freq: Four times a day (QID) | ORAL | Status: DC | PRN

## 2015-03-12 MED ORDER — OXYCODONE-ACETAMINOPHEN 10-325 MG PO TABS: 1.0000 | ORAL_TABLET | ORAL | Status: DC | PRN

## 2015-03-12 MED ORDER — DOCUSATE SODIUM 100 MG PO CAPS: 100.0000 mg | ORAL_CAPSULE | Freq: Two times a day (BID) | ORAL | Status: DC

## 2015-03-12 NOTE — Discharge Summary (Signed)
Physician Discharge Summary  Patient ID: David Keith MRN: [id number] DOB/AGE: [birth year] 59 y.o.  Admit date: 03/10/2015 Discharge date: 03/12/2015  Admission Diagnoses: L4-5 spondylolisthesis, L3-4 and L4-5 spinal stenosis, lumbago, lumbar radiculopathy, neurogenic claudication  Discharge Diagnoses: The same Active Problems:   Spondylolisthesis of lumbar region   Discharged Condition: good  Hospital Course: I performed a lumbar decompression, agitation, and fusion on the patient on 03/10/2015. The surgery went well.  The patient's postoperative course was unremarkable. On postoperative day #2 he requested discharge to home. He was given oral and written discharge instructions. All his questions were answered.  Consults: PT Significant Diagnostic Studies: None Treatments: L3-4 and L4-5 laminectomy, L4-5 instrumentation and fusion Discharge Exam: Blood pressure 125/56, pulse 92, temperature 98.2 F (36.8 C), temperature source Oral, resp. rate 17, height  (1.753 m), weight 69.4 kg (153 lb), SpO2 100 %. The patient is alert and pleasant. His dressing is clean and dry. His strength is normal in his lower extremities.  Disposition: Home  Discharge Instructions    Call MD for:  difficulty breathing, headache or visual disturbances    Complete by:  As directed      Call MD for:  extreme fatigue    Complete by:  As directed      Call MD for:  hives    Complete by:  As directed      Call MD for:  persistant dizziness or light-headedness    Complete by:  As directed      Call MD for:  persistant nausea and vomiting    Complete by:  As directed      Call MD for:  redness, tenderness, or signs of infection (pain, swelling, redness, odor or green/yellow discharge around incision site)    Complete by:  As directed      Call MD for:  severe uncontrolled pain    Complete by:  As directed      Call MD for:  temperature >100.4    Complete by:  As directed      Diet - low  sodium heart healthy    Complete by:  As directed      Discharge instructions    Complete by:  As directed   Call [phone] for a followup appointment. Take a stool softener while you are using pain medications.     Driving Restrictions    Complete by:  As directed   Do not drive for 2 weeks.     Increase activity slowly    Complete by:  As directed      Lifting restrictions    Complete by:  As directed   Do not lift more than 5 pounds. No excessive bending or twisting.     May shower / Bathe    Complete by:  As directed   He may shower after the pain she is removed 3 days after surgery. Leave the incision alone.     Remove dressing in 24 hours    Complete by:  As directed             Medication List    STOP taking these medications        cyclobenzaprine 10 MG tablet  Commonly known as:  FLEXERIL     meloxicam 15 MG tablet  Commonly known as:  MOBIC      TAKE these medications        amLODipine 10 MG tablet  Commonly known as:  NORVASC  TAKE 1  TABLET BY MOUTH EVERY DAY.  OFFICE VISIT NEEDED FOR ADDITIONAL REFILLS     amLODipine 10 MG tablet  Commonly known as:  NORVASC  Take 1 tablet (10 mg total) by mouth daily. PATIENT NEEDS OFFICE VISIT FOR ADDITIONAL REFILLS     diazepam 5 MG tablet  Commonly known as:  VALIUM  Take 1 tablet (5 mg total) by mouth every 6 (six) hours as needed for muscle spasms.     docusate sodium 100 MG capsule  Commonly known as:  COLACE  Take 1 capsule (100 mg total) by mouth 2 (two) times daily.     lisinopril 20 MG tablet  Commonly known as:  PRINIVIL,ZESTRIL  Take 1 tablet (20 mg total) by mouth daily.     multivitamin with minerals Tabs tablet  Take 1 tablet by mouth daily.     oxyCODONE-acetaminophen 10-325 MG per tablet  Commonly known as:  PERCOCET  Take 1 tablet by mouth every 4 (four) hours as needed for pain.     sildenafil 100 MG tablet  Commonly known as:  VIAGRA  TAKE 1/2 TO 1 TABLET BY MOUTH DAILY AS NEEDED FOR  ERECTILE DYSFUNCTION         Signed: Cristi Loron 03/12/2015, 2:21 PM

## 2015-03-12 NOTE — Progress Notes (Signed)
Discharge instructions given. Pt verbalized understanding and all questions were answered.  

## 2015-03-12 NOTE — Progress Notes (Signed)
Physical Therapy Treatment Patient Details Name: David Keith MRN: [id number] DOB: 02-19-1956 Today's Date: 03/12/2015    History of Present Illness 59 y.o. male s/p Lumbar three-four and Lumbar four-five laminectomies with Lumbar four-five posterior lumbar interbody fusion     PT Comments    Progressing well towards physical therapy goals. Focused on upright posture with ambulation, no loss of balance noted. Requires close guard assist to stand due to LE weakness which we addressed with multiple transfers today and therapeutic exercises to strengthen glutes and quads. Patient will continue to benefit from skilled physical therapy services to further improve independence with functional mobility.Will continue to follow and progress until d/c.  Follow Up Recommendations  No PT follow up;Supervision/Assistance - 24 hour (initially 24 hr supervision)     Equipment Recommendations  Rolling walker with 5" wheels    Recommendations for Other Services OT consult     Precautions / Restrictions Precautions Precautions: Back Precaution Comments: reviewed Required Braces or Orthoses: Spinal Brace Spinal Brace: Lumbar corset Restrictions Weight Bearing Restrictions: No    Mobility  Bed Mobility Overal bed mobility: Needs Assistance Bed Mobility: Rolling;Sidelying to Sit Rolling: Supervision Sidelying to sit: Supervision       General bed mobility comments: supervision for safety. VC for technique. No physical assist required for log roll technique  Transfers Overall transfer level: Needs assistance Equipment used: Rolling walker (2 wheeled) Transfers: Sit to/from Stand Sit to Stand: Min guard         General transfer comment: Min guard for safety. RLE with weakness upon rising. Performed x3 from bed and recliner each. Found that Rt hand on RW and Lt hand on bed was safest position to rise from. Cues for back precautions with each technique  practiced.  Ambulation/Gait Ambulation/Gait assistance: Supervision Ambulation Distance (Feet): 525 Feet Assistive device: Rolling walker (2 wheeled) Gait Pattern/deviations: Step-through pattern;Trunk flexed Gait velocity: decreased   General Gait Details: supervision for safety, VC for upright posture intermittently. No buckling noted. Couple instance of back spasms reported. Better job of self correcting posture today.   Stairs Stairs: Yes Stairs assistance: Supervision Stair Management: One rail Right;Alternating pattern;Step to pattern;Forwards Number of Stairs: 12 General stair comments: Stair training with cues for sequencing. No loss of balance or buckling noted. Instructions for use of rail on right to simulate home environment.  Wheelchair Mobility    Modified Rankin (Stroke Patients Only)       Balance                                    Cognition Arousal/Alertness: Awake/alert Behavior During Therapy: WFL for tasks assessed/performed Overall Cognitive Status: Within Functional Limits for tasks assessed                      Exercises General Exercises - Lower Extremity Ankle Circles/Pumps: AROM;Both;10 reps;Seated Gluteal Sets: Strengthening;Both;10 reps;Seated Long Arc Quad: Strengthening;Both;10 reps;Seated Hip ABduction/ADduction: Strengthening;Both;10 reps;Seated (adduction with iso hold) Hip Flexion/Marching: Strengthening;Both;10 reps;Seated;Standing Other Exercises Other Exercises: standing kick-backs for glute activation/strengthening x 10 bil    General Comments General comments (skin integrity, edema, etc.): Reviewed back precautions and positioning. Practiced donning brace which he can perform independently.      Pertinent Vitals/Pain Pain Assessment: 0-10 Pain Score:  (doing better) Pain Location: back Pain Descriptors / Indicators: Aching Pain Intervention(s): Monitored during session;Repositioned    Home Living  Prior Function            PT Goals (current goals can now be found in the care plan section) Acute Rehab PT Goals Patient Stated Goal: go home PT Goal Formulation: With patient Time For Goal Achievement: 03/25/15 Potential to Achieve Goals: Good Progress towards PT goals: Progressing toward goals    Frequency  Min 5X/week    PT Plan Equipment recommendations need to be updated    Co-evaluation             End of Session Equipment Utilized During Treatment: Back brace Activity Tolerance: Patient tolerated treatment well Patient left: in chair;with call bell/phone within reach;with chair alarm set     Time: 1610-9604 PT Time Calculation (min) (ACUTE ONLY): 27 min  Charges:  $Gait Training: 8-22 mins $Therapeutic Exercise: 8-22 mins                    G Codes:      Berton Mount 2015/03/20, 10:53 AM Charlsie Merles, PT [phone]

## 2015-04-18 ENCOUNTER — Other Ambulatory Visit: Payer: Self-pay | Admitting: Emergency Medicine

## 2015-07-29 DIAGNOSIS — Z0271 Encounter for disability determination: Secondary | ICD-10-CM

## 2016-02-08 ENCOUNTER — Ambulatory Visit (INDEPENDENT_AMBULATORY_CARE_PROVIDER_SITE_OTHER): Payer: No Typology Code available for payment source | Admitting: Family Medicine

## 2016-02-08 ENCOUNTER — Encounter: Payer: Self-pay | Admitting: Family Medicine

## 2016-02-08 VITALS — BP 142/82 | HR 96 | Temp 98.8°F | Resp 16 | Ht 69.0 in | Wt 148.0 lb

## 2016-02-08 DIAGNOSIS — M25461 Effusion, right knee: Secondary | ICD-10-CM

## 2016-02-08 DIAGNOSIS — I10 Essential (primary) hypertension: Secondary | ICD-10-CM

## 2016-02-08 DIAGNOSIS — Z23 Encounter for immunization: Secondary | ICD-10-CM

## 2016-02-08 DIAGNOSIS — F17200 Nicotine dependence, unspecified, uncomplicated: Secondary | ICD-10-CM

## 2016-02-08 DIAGNOSIS — M25561 Pain in right knee: Secondary | ICD-10-CM

## 2016-02-08 LAB — POCT URINALYSIS DIP (DEVICE)
BILIRUBIN URINE: NEGATIVE
Bilirubin Urine: NEGATIVE
GLUCOSE, UA: NEGATIVE mg/dL
Glucose, UA: NEGATIVE mg/dL
HGB URINE DIPSTICK: NEGATIVE
Hgb urine dipstick: NEGATIVE
KETONES UR: NEGATIVE mg/dL
KETONES UR: NEGATIVE mg/dL
LEUKOCYTES UA: NEGATIVE
Leukocytes, UA: NEGATIVE
Nitrite: NEGATIVE
Nitrite: NEGATIVE
Protein, ur: NEGATIVE mg/dL
Protein, ur: NEGATIVE mg/dL
SPECIFIC GRAVITY, URINE: 1.01 (ref 1.005–1.030)
SPECIFIC GRAVITY, URINE: 1.01 (ref 1.005–1.030)
Urobilinogen, UA: 0.2 mg/dL (ref 0.0–1.0)
Urobilinogen, UA: 0.2 mg/dL (ref 0.0–1.0)
pH: 5.5 (ref 5.0–8.0)
pH: 5.5 (ref 5.0–8.0)

## 2016-02-08 LAB — CBC WITH DIFFERENTIAL/PLATELET
BASOS PCT: 0 %
Basophils Absolute: 0 cells/uL (ref 0–200)
EOS ABS: 0 {cells}/uL — AB (ref 15–500)
EOS PCT: 0 %
HCT: 45.3 % (ref 38.5–50.0)
HEMOGLOBIN: 14.9 g/dL (ref 13.2–17.1)
LYMPHS ABS: 1000 {cells}/uL (ref 850–3900)
Lymphocytes Relative: 20 %
MCH: 29.7 pg (ref 27.0–33.0)
MCHC: 32.9 g/dL (ref 32.0–36.0)
MCV: 90.2 fL (ref 80.0–100.0)
MONOS PCT: 9 %
MPV: 10.7 fL (ref 7.5–12.5)
Monocytes Absolute: 450 cells/uL (ref 200–950)
Neutro Abs: 3550 cells/uL (ref 1500–7800)
Neutrophils Relative %: 71 %
Platelets: 222 10*3/uL (ref 140–400)
RBC: 5.02 MIL/uL (ref 4.20–5.80)
RDW: 15.4 % — ABNORMAL HIGH (ref 11.0–15.0)
WBC: 5 10*3/uL (ref 3.8–10.8)

## 2016-02-08 LAB — COMPLETE METABOLIC PANEL WITH GFR
ALT: 18 U/L (ref 9–46)
AST: 22 U/L (ref 10–35)
Albumin: 4.3 g/dL (ref 3.6–5.1)
Alkaline Phosphatase: 67 U/L (ref 40–115)
BILIRUBIN TOTAL: 0.4 mg/dL (ref 0.2–1.2)
BUN: 17 mg/dL (ref 7–25)
CO2: 25 mmol/L (ref 20–31)
CREATININE: 0.89 mg/dL (ref 0.70–1.25)
Calcium: 9.5 mg/dL (ref 8.6–10.3)
Chloride: 106 mmol/L (ref 98–110)
GFR, Est Non African American: >89 mL/min (ref >=60)
Glucose, Bld: 103 mg/dL — ABNORMAL HIGH (ref 65–99)
Potassium: 4.6 mmol/L (ref 3.5–5.3)
Sodium: 141 mmol/L (ref 135–146)
Total Protein: 7 g/dL (ref 6.1–8.1)

## 2016-02-08 LAB — LIPID PANEL
CHOLESTEROL: 158 mg/dL (ref 125–200)
HDL: 79 mg/dL (ref >=40)
LDL CALC: 71 mg/dL (ref <130)
TRIGLYCERIDES: 38 mg/dL (ref <150)
Total CHOL/HDL Ratio: 2 Ratio (ref <=5.0)
VLDL: 8 mg/dL (ref <30)

## 2016-02-08 LAB — SEDIMENTATION RATE: SED RATE: 1 mm/h (ref 0–20)

## 2016-02-08 LAB — URIC ACID: Uric Acid, Serum: 5.9 mg/dL (ref 4.0–7.8)

## 2016-02-08 MED ORDER — DICLOFENAC SODIUM 3 % TD GEL: 1.0000 "application " | Freq: Two times a day (BID) | TRANSDERMAL | Status: DC

## 2016-02-08 MED ORDER — MELOXICAM 7.5 MG PO TABS: 7.5000 mg | ORAL_TABLET | Freq: Every day | ORAL | Status: DC

## 2016-02-08 MED ORDER — AMLODIPINE BESYLATE 5 MG PO TABS: ORAL_TABLET | ORAL | Status: DC

## 2016-02-08 NOTE — Progress Notes (Signed)
Subjective:    Patient ID: GILMAN OLAZABAL, male    DOB: [birth year], 60 y.o.   MRN: [id number]  HPI Mr. Chaseton Yepiz, a 60 year old male with a history of hypertension presents to establish care. He says that he has not had a primary provider, he has primarily been using the emergency department and urgent care for medical needs.  Patient here for follow-up of elevated blood pressure. He is not exercising and is not adherent to low salt diet. He does not check blood pressure at home. Patient denies chest pain, dyspnea, fatigue, irregular heart beat, palpitations, syncope and tachypnea.  Cardiovascular risk factors: hypertension and male gender.  Mr. Salway is complaining of right knee pain. 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 Current symptoms include giving out, popping sensation, stiffness and swelling. Pain is aggravated by inactivity, kneeling, lateral movements and rising after sitting.  Patient has had prior knee problems. Evaluation to date: plain films and an MRI.  Treatment to date: avoidance of offending activity and rest.  Past Medical History  Diagnosis Date  . Hypertension     takes AMlodipine daily  . Weakness     numbness in both legs when walking  . Chronic back pain     radiculopathy and stenosis  . Joint swelling     right knee   Social History   Social History  . Marital Status: Married    Spouse Name: N/A  . Number of Children: N/A  . Years of Education: N/A   Occupational History  . Not on file.   Social History Main Topics  . Smoking status: Current Every Day Smoker -- 0.50 packs/day for 20 years    Types: Cigarettes  . Smokeless tobacco: Never Used     Comment: 3 packs a week  . Alcohol Use: 1.2 oz/week    2 Cans of beer per week     Comment: daily  . Drug Use: No  . Sexual Activity: Not on file   Other Topics Concern  . Not on file   Social History Narrative   Immunization History  Administered  Date(s) Administered  . Pneumococcal Polysaccharide-23 02/08/2016  . Tdap 02/08/2016   Review of Systems  Constitutional: Negative.  Negative for fatigue.  HENT: Negative.   Eyes: Negative.  Negative for visual disturbance.  Respiratory: Negative.   Cardiovascular: Negative.   Endocrine: Negative.  Negative for polydipsia, polyphagia and polyuria.  Genitourinary: Negative.   Musculoskeletal: Positive for myalgias (right knee pain) and joint swelling.  Skin: Negative.   Allergic/Immunologic: Negative.   Neurological: Negative.   Hematological: Negative.   Psychiatric/Behavioral: Negative.        Objective:   Physical Exam  Constitutional: He is oriented to person, place, and time. He appears well-developed and well-nourished.  HENT:  Head: Normocephalic and atraumatic.  Right Ear: External ear normal.  Left Ear: External ear normal.  Nose: Nose normal.  Mouth/Throat: Oropharynx is clear and moist.  Eyes: Conjunctivae and EOM are normal. Pupils are equal, round, and reactive to light.  Neck: Normal range of motion. Neck supple.  Cardiovascular: Normal rate, regular rhythm, normal heart sounds and intact distal pulses.   Abdominal: Soft. Bowel sounds are normal.  Musculoskeletal:       Right knee: He exhibits decreased range of motion and swelling.  Neurological: He is oriented to person, place, and time. He has normal reflexes.  Skin: Skin is warm and dry.  Psychiatric: He has a normal mood and affect. His behavior is normal. Judgment and thought content normal.      BP 142/82 mmHg  Pulse 96  Temp(Src) 98.8 F (37.1 C) (Oral)  Resp 16  Ht 5\' 9"  (1.753 m)  Wt 148 lb (67.132 kg)  BMI 21.85 kg/m2  SpO2 98% Assessment & Plan:  1. Essential hypertension Will re-start Amlodipine 5 mg. Will follow up in office in 1 month. Will check kidney functioning.  - Lipid Panel - COMPLETE METABOLIC PANEL WITH GFR - amLODipine (NORVASC) 5 MG tablet; TAKE 1 TABLET BY MOUTH EVERY DAY.   Dispense: 30 tablet; Refill: 1 - EKG 12-Lead - Urinalysis Dipstick - POCT urinalysis dip (device) - POCT urinalysis dip (device)  2. Knee swelling, right Recommend that patient apply warm, moist compresses to right knee and elevate right extremity to heart level while at rest.  - Diclofenac Sodium 3 % GEL; Place 1 application onto the skin 2 (two) times daily.  Dispense: 1 Tube; Refill: 1 - Sedimentation Rate - Uric Acid - CBC with Differential - meloxicam (MOBIC) 7.5 MG tablet; Take 1 tablet (7.5 mg total) by mouth daily.  Dispense: 30 tablet; Refill: 0  3. Right knee pain - ibuprofen (ADVIL,MOTRIN) 200 MG tablet; Take 200 mg by mouth every 6 (six) hours as needed. - Diclofenac Sodium 3 % GEL; Place 1 application onto the skin 2 (two) times daily.  Dispense: 1 Tube; Refill: 1 - meloxicam (MOBIC) 7.5 MG tablet; Take 1 tablet (7.5 mg total) by mouth daily.  Dispense: 30 tablet; Refill: 0  4. Tobacco dependence Smoking cessation instruction/counseling given:  counseled patient on the dangers of tobacco use, advised patient to stop smoking, and reviewed strategies to maximize success  5. Immunization due - Tdap vaccine greater than or equal to 7yo IM - Pneumococcal polysaccharide vaccine 23-valent greater than or equal to 2yo subcutaneous/IM    Routine Health Maintenance:  Colonoscopy 10 years ago, normal. Recommend a digital rectal exam with hemoccult card Recommend a routine eye examination Will follow up with surgeon next year.     RTC: 1 month for hypertension and right knee pain  Lynnie Koehler M, FNP

## 2016-02-08 NOTE — Patient Instructions (Signed)
DASH Eating Plan DASH stands for "Dietary Approaches to Stop Hypertension." The DASH eating plan is a healthy eating plan that has been shown to reduce high blood pressure (hypertension). Additional health benefits may include reducing the risk of type 2 diabetes mellitus, heart disease, and stroke. The DASH eating plan may also help with weight loss. WHAT DO I NEED TO KNOW ABOUT THE DASH EATING PLAN? For the DASH eating plan, you will follow these general guidelines:  Choose foods with a percent daily value for sodium of less than 5% (as listed on the food label).  Use salt-free seasonings or herbs instead of table salt or sea salt.  Check with your health care provider or pharmacist before using salt substitutes.  Eat lower-sodium products, often labeled as "lower sodium" or "no salt added."  Eat fresh foods.  Eat more vegetables, fruits, and low-fat dairy products.  Choose whole grains. Look for the word "whole" as the first word in the ingredient list.  Choose fish and skinless chicken or turkey more often than red meat. Limit fish, poultry, and meat to 6 oz (170 g) each day.  Limit sweets, desserts, sugars, and sugary drinks.  Choose heart-healthy fats.  Limit cheese to 1 oz (28 g) per day.  Eat more home-cooked food and less restaurant, buffet, and fast food.  Limit fried foods.  Cook foods using methods other than frying.  Limit canned vegetables. If you do use them, rinse them well to decrease the sodium.  When eating at a restaurant, ask that your food be prepared with less salt, or no salt if possible. WHAT FOODS CAN I EAT? Seek help from a dietitian for individual calorie needs. Grains Whole grain or whole wheat bread. Brown rice. Whole grain or whole wheat pasta. Quinoa, bulgur, and whole grain cereals. Low-sodium cereals. Corn or whole wheat flour tortillas. Whole grain cornbread. Whole grain crackers. Low-sodium crackers. Vegetables Fresh or frozen vegetables  (raw, steamed, roasted, or grilled). Low-sodium or reduced-sodium tomato and vegetable juices. Low-sodium or reduced-sodium tomato sauce and paste. Low-sodium or reduced-sodium canned vegetables.  Fruits All fresh, canned (in natural juice), or frozen fruits. Meat and Other Protein Products Ground beef (85% or leaner), grass-fed beef, or beef trimmed of fat. Skinless chicken or turkey. Ground chicken or turkey. Pork trimmed of fat. All fish and seafood. Eggs. Dried beans, peas, or lentils. Unsalted nuts and seeds. Unsalted canned beans. Dairy Low-fat dairy products, such as skim or 1% milk, 2% or reduced-fat cheeses, low-fat ricotta or cottage cheese, or plain low-fat yogurt. Low-sodium or reduced-sodium cheeses. Fats and Oils Tub margarines without trans fats. Light or reduced-fat mayonnaise and salad dressings (reduced sodium). Avocado. Safflower, olive, or canola oils. Natural peanut or almond butter. Other Unsalted popcorn and pretzels. The items listed above may not be a complete list of recommended foods or beverages. Contact your dietitian for more options. WHAT FOODS ARE NOT RECOMMENDED? Grains White bread. White pasta. White rice. Refined cornbread. Bagels and croissants. Crackers that contain trans fat. Vegetables Creamed or fried vegetables. Vegetables in a cheese sauce. Regular canned vegetables. Regular canned tomato sauce and paste. Regular tomato and vegetable juices. Fruits Dried fruits. Canned fruit in light or heavy syrup. Fruit juice. Meat and Other Protein Products Fatty cuts of meat. Ribs, chicken wings, bacon, sausage, bologna, salami, chitterlings, fatback, hot dogs, bratwurst, and packaged luncheon meats. Salted nuts and seeds. Canned beans with salt. Dairy Whole or 2% milk, cream, half-and-half, and cream cheese. Whole-fat or sweetened yogurt. Full-fat   cheeses or blue cheese. Nondairy creamers and whipped toppings. Processed cheese, cheese spreads, or cheese  curds. Condiments Onion and garlic salt, seasoned salt, table salt, and sea salt. Canned and packaged gravies. Worcestershire sauce. Tartar sauce. Barbecue sauce. Teriyaki sauce. Soy sauce, including reduced sodium. Steak sauce. Fish sauce. Oyster sauce. Cocktail sauce. Horseradish. Ketchup and mustard. Meat flavorings and tenderizers. Bouillon cubes. Hot sauce. Tabasco sauce. Marinades. Taco seasonings. Relishes. Fats and Oils Butter, stick margarine, lard, shortening, ghee, and bacon fat. Coconut, palm kernel, or palm oils. Regular salad dressings. Other Pickles and olives. Salted popcorn and pretzels. The items listed above may not be a complete list of foods and beverages to avoid. Contact your dietitian for more information. WHERE CAN I FIND MORE INFORMATION? National Heart, Lung, and [employer] Institute: CablePromo.itwww.nhlbi.nih.gov/health/health-topics/topics/dash/   This information is not intended to replace advice given to you by your health care provider. Make sure you discuss any questions you have with your health care provider.   Document Released: 08/31/2011 Document Revised: 10/02/2014 Document Reviewed: 07/16/2013 Elsevier Interactive Patient Education 2016 [employer] Knee Pain Knee pain is a very common symptom and can have many causes. Knee pain often goes away when you follow your health care provider's instructions for relieving pain and discomfort at home. However, knee pain can develop into a condition that needs treatment. Some conditions may include:  Arthritis caused by wear and tear (osteoarthritis).  Arthritis caused by swelling and irritation (rheumatoid arthritis or gout).  A cyst or growth in your knee.  An infection in your knee joint.  An injury that will not heal.  Damage, swelling, or irritation of the tissues that support your knee (torn ligaments or tendinitis). If your knee pain continues, additional tests may be ordered to diagnose your condition. Tests may  include X-rays or other imaging studies of your knee. You may also need to have fluid removed from your knee. Treatment for ongoing knee pain depends on the cause, but treatment may include:  Medicines to relieve pain or swelling.  Steroid injections in your knee.  Physical therapy.  Surgery. HOME CARE INSTRUCTIONS  Take medicines only as directed by your health care provider.  Rest your knee and keep it raised (elevated) while you are resting.  Do not do things that cause or worsen pain.  Avoid high-impact activities or exercises, such as running, jumping rope, or doing jumping jacks.  Apply ice to the knee area:  Put ice in a plastic bag.  Place a towel between your skin and the bag.  Leave the ice on for 20 minutes, 2-3 times a day.  Ask your health care provider if you should wear an elastic knee support.  Keep a pillow under your knee when you sleep.  Lose weight if you are overweight. Extra weight can put pressure on your knee.  Do not use any tobacco products, including cigarettes, chewing tobacco, or electronic cigarettes. If you need help quitting, ask your health care provider. Smoking may slow the healing of any bone and joint problems that you may have. SEEK MEDICAL CARE IF:  Your knee pain continues, changes, or gets worse.  You have a fever along with knee pain.  Your knee buckles or locks up.  Your knee becomes more swollen. SEEK IMMEDIATE MEDICAL CARE IF:   Your knee joint feels hot to the touch.  You have chest pain or trouble breathing.   This information is not intended to replace advice given to you by your health care  provider. Make sure you discuss any questions you have with your health care provider.   Document Released: 07/09/2007 Document Revised: 10/02/2014 Document Reviewed: 04/27/2014 Elsevier Interactive Patient Education 2016 [employer] Inc. Diclofenac skin gel What is this medicine? DICLOFENAC (dye KLOE fen ak) is a non-steroidal  anti-inflammatory drug (NSAID). The 1% skin gel is used to treat osteoarthritis of the hands or knees. The 3% skin gel is used to treat actinic keratosis. This medicine may be used for other purposes; ask your health care provider or pharmacist if you have questions. What should I tell my health care provider before I take this medicine? They need to know if you have any of these conditions: -asthma -bleeding problems -coronary artery bypass graft (CABG) surgery within the past 2 weeks -heart disease -high blood pressure -if you frequently drink alcohol containing drinks -kidney disease -liver disease -open or infected skin -stomach problems -an unusual or allergic reaction to diclofenac, aspirin, other NSAIDs, other medicines, benzyl alcohol (3% gel only), foods, dyes, or preservatives -pregnant or trying to get pregnant -breast-feeding How should I use this medicine? This medicine is for external use only. Follow the directions on the prescription label. Wash hands before and after use. Do not get this medicine in your eyes. If you do, rinse out with plenty of cool tap water. Use your doses at regular intervals. Do not use your medicine more often than directed. A special MedGuide will be given to you by the pharmacist with each prescription and refill of the 1% gel. Be sure to read this information carefully each time. Talk to your pediatrician regarding the use of this medicine in children. Special care may be needed. The 3% gel is not approved for use in children. Overdosage: If you think you have taken too much of this medicine contact a poison control center or emergency room at once. NOTE: This medicine is only for you. Do not share this medicine with others. What if I miss a dose? If you miss a dose, use it as soon as you can. If it is almost time for your next dose, use only that dose. Do not use double or extra doses. What may interact with this medicine? -aspirin -NSAIDs,  medicines for pain and inflammation, like ibuprofen or naproxen Do not use any other skin products without telling your doctor or health care professional. This list may not describe all possible interactions. Give your health care provider a list of all the medicines, herbs, non-prescription drugs, or dietary supplements you use. Also tell them if you smoke, drink alcohol, or use illegal drugs. Some items may interact with your medicine. What should I watch for while using this medicine? Tell your doctor or healthcare professional if your symptoms do not start to get better or if they get worse. You will need to follow up with your health care provider to monitor your progress. You may need to be treated for up to 3 months if you are using the 3% gel, but the full effect may not occur until 1 month after stopping treatment. If you develop a severe skin reaction, contact your doctor or health care professional immediately. This medicine can make you more sensitive to the sun. Keep out of the sun. If you cannot avoid being in the sun, wear protective clothing and use sunscreen. Do not use sun lamps or tanning beds/booths. Do not take medicines such as ibuprofen and naproxen with this medicine. Side effects such as stomach upset, nausea, or ulcers may  be more likely to occur. Many medicines available without a prescription should not be taken with this medicine. This medicine does not prevent heart attack or stroke. In fact, this medicine may increase the chance of a heart attack or stroke. The chance may increase with longer use of this medicine and in people who have heart disease. If you take aspirin to prevent heart attack or stroke, talk with your doctor or health care professional. This medicine can cause ulcers and bleeding in the stomach and intestines at any time during treatment. Do not smoke cigarettes or drink alcohol. These increase irritation to your stomach and can make it more susceptible to  damage from this medicine. Ulcers and bleeding can happen without warning symptoms and can cause death. You may get drowsy or dizzy. Do not drive, use machinery, or do anything that needs mental alertness until you know how this medicine affects you. Do not stand or sit up quickly, especially if you are an older patient. This reduces the risk of dizzy or fainting spells. This medicine can cause you to bleed more easily. Try to avoid damage to your teeth and gums when you brush or floss your teeth. What side effects may I notice from receiving this medicine? Side effects that you should report to your doctor or health care professional as soon as possible: -allergic reactions like skin rash, itching or hives, swelling of the face, lips, or tongue -black or bloody stools, blood in the urine or vomit -blurred vision -chest pain -difficulty breathing or wheezing -nausea or vomiting -redness, blistering, peeling or loosening of the skin, including inside the mouth -slurred speech or weakness on one side of the body -trouble passing urine or change in the amount of urine -unexplained weight gain or swelling -unusually weak or tired -yellowing of eyes or skin Side effects that usually do not require medical attention (report to your doctor or health care professional if they continue or are bothersome): -dizziness -dry skin -headache -heartburn -increased sensitivity to the sun -stomach pain -tingling at the application site This list may not describe all possible side effects. Call your doctor for medical advice about side effects. You may report side effects to FDA at 1-800-FDA-1088. Where should I keep my medicine? Keep out of the reach of children. Store the 1% gel at room temperature between 15 and 30 degrees C (59 and 86 degrees F). Store the 3% gel at room temperature between 20 and 25 degrees C (68 and 77 degrees F). Protect from light. Throw away any unused medicine after the expiration  date. NOTE: This sheet is a summary. It may not cover all possible information. If you have questions about this medicine, talk to your doctor, pharmacist, or health care provider.    2016, Elsevier/Gold Standard. (2008-01-13 16:35:07)

## 2016-02-09 ENCOUNTER — Encounter: Payer: Self-pay | Admitting: Family Medicine

## 2016-02-09 DIAGNOSIS — M25561 Pain in right knee: Secondary | ICD-10-CM | POA: Insufficient documentation

## 2016-02-09 DIAGNOSIS — M25469 Effusion, unspecified knee: Secondary | ICD-10-CM | POA: Insufficient documentation

## 2016-02-16 ENCOUNTER — Other Ambulatory Visit: Payer: Self-pay | Admitting: Family Medicine

## 2016-02-16 DIAGNOSIS — M25461 Effusion, right knee: Secondary | ICD-10-CM

## 2016-02-16 DIAGNOSIS — M25561 Pain in right knee: Secondary | ICD-10-CM

## 2016-02-16 MED ORDER — DICLOFENAC SODIUM 1 % TD GEL: 2.0000 g | Freq: Four times a day (QID) | TRANSDERMAL | Status: DC

## 2016-03-10 ENCOUNTER — Encounter: Payer: Self-pay | Admitting: Family Medicine

## 2016-03-10 ENCOUNTER — Ambulatory Visit (INDEPENDENT_AMBULATORY_CARE_PROVIDER_SITE_OTHER): Payer: No Typology Code available for payment source | Admitting: Family Medicine

## 2016-03-10 VITALS — BP 144/88 | HR 75 | Temp 98.3°F | Resp 16 | Ht 69.0 in | Wt 146.0 lb

## 2016-03-10 DIAGNOSIS — K029 Dental caries, unspecified: Secondary | ICD-10-CM

## 2016-03-10 DIAGNOSIS — I10 Essential (primary) hypertension: Secondary | ICD-10-CM

## 2016-03-10 DIAGNOSIS — M25461 Effusion, right knee: Secondary | ICD-10-CM

## 2016-03-10 DIAGNOSIS — M25561 Pain in right knee: Secondary | ICD-10-CM

## 2016-03-10 DIAGNOSIS — F17200 Nicotine dependence, unspecified, uncomplicated: Secondary | ICD-10-CM

## 2016-03-10 MED ORDER — AMLODIPINE BESYLATE 5 MG PO TABS: ORAL_TABLET | ORAL | Status: DC

## 2016-03-10 NOTE — Progress Notes (Signed)
Subjective:    Patient ID: David Keith, male    DOB: 17-Oct-1955, 60 y.o.   MRN: [id number]  Hypertension This is a chronic problem. The current episode started more than 1 year ago. The problem has been gradually worsening since onset. The problem is controlled. Pertinent negatives include no anxiety, blurred vision, chest pain, headaches, malaise/fatigue, neck pain, orthopnea, palpitations, peripheral edema, PND, shortness of breath or sweats. Risk factors for coronary artery disease include male gender and obesity. The current treatment provides moderate improvement. There are no compliance problems.   Knee Pain  The pain is present in the right knee. The quality of the pain is described as shooting. The pain is at a severity of 2/10. The pain is mild. The pain has been fluctuating since onset. Associated symptoms include a loss of motion. Pertinent negatives include no inability to bear weight, loss of sensation, muscle weakness, numbness or tingling. The symptoms are aggravated by movement and weight bearing. He has tried NSAIDs for the symptoms. The treatment provided mild relief.    Past Medical History  Diagnosis Date  . Hypertension     takes AMlodipine daily  . Weakness     numbness in both legs when walking  . Chronic back pain     radiculopathy and stenosis  . Joint swelling     right knee   Social History   Social History  . Marital Status: Married    Spouse Name: N/A  . Number of Children: N/A  . Years of Education: N/A   Occupational History  . Not on file.   Social History Main Topics  . Smoking status: Current Every Day Smoker -- 0.50 packs/day for 20 years    Types: Cigarettes  . Smokeless tobacco: Never Used     Comment: 3 packs a week  . Alcohol Use: 1.2 oz/week    2 Cans of beer per week     Comment: daily  . Drug Use: No  . Sexual Activity: Not on file   Other Topics Concern  . Not on file   Social History Narrative   Immunization History   Administered Date(s) Administered  . Pneumococcal Polysaccharide-23 02/08/2016  . Tdap 02/08/2016   Review of Systems  Constitutional: Negative.  Negative for malaise/fatigue and fatigue.  HENT: Negative.   Eyes: Negative.  Negative for blurred vision and visual disturbance.  Respiratory: Negative.  Negative for shortness of breath.   Cardiovascular: Negative.  Negative for chest pain, palpitations, orthopnea and PND.  Endocrine: Negative.  Negative for polydipsia, polyphagia and polyuria.  Genitourinary: Negative.   Musculoskeletal: Positive for myalgias (right knee pain) and joint swelling. Negative for neck pain.  Skin: Negative.   Allergic/Immunologic: Negative.   Neurological: Negative.  Negative for tingling, numbness and headaches.  Hematological: Negative.   Psychiatric/Behavioral: Negative.        Objective:   Physical Exam  Constitutional: He is oriented to person, place, and time. He appears well-developed and well-nourished.  HENT:  Head: Normocephalic and atraumatic.  Right Ear: External ear normal.  Left Ear: External ear normal.  Nose: Nose normal.  Mouth/Throat: Oropharynx is clear and moist.  Eyes: Conjunctivae and EOM are normal. Pupils are equal, round, and reactive to light.  Neck: Normal range of motion. Neck supple.  Cardiovascular: Normal rate, regular rhythm, normal heart sounds and intact distal pulses.   Abdominal: Soft. Bowel sounds are normal.  Musculoskeletal:       Right knee: He exhibits decreased range  of motion and swelling.  Neurological: He is oriented to person, place, and time. He has normal reflexes.  Skin: Skin is warm and dry.  Psychiatric: He has a normal mood and affect. His behavior is normal. Judgment and thought content normal.      BP 144/88 mmHg  Pulse 75  Temp(Src) 98.3 F (36.8 C) (Oral)  Resp 16  Ht 5\' 9"  (1.753 m)  Wt 146 lb (66.225 kg)  BMI 21.55 kg/m2  SpO2 100% Assessment & Plan:  1. Essential  hypertension Blood pressure is at goal on current medication regimen. The patient is asked to make an attempt to improve diet and exercise patterns to aid in medical management of this problem. - amLODipine (NORVASC) 5 MG tablet; TAKE 1 TABLET BY MOUTH EVERY DAY.  Dispense: 30 tablet; Refill: 2  2. Knee swelling, right Recommend that patient apply warm, moist compresses to right knee and elevate right extremity to heart level while at rest.  - Diclofenac Sodium 3 % GEL; Place 1 application onto the skin 2 (two) times daily.  Dispense: 1 Tube; Refill: 1 - Sedimentation Rate - Uric Acid - CBC with Differential   3. Right knee pain - meloxicam (MOBIC) 7.5 MG tablet; Take 1 tablet (7.5 mg total) by mouth daily.  Dispense: 30 tablet; Refill: 0  4. Dental caries  - Ambulatory referral to Dentistry  5. Tobacco dependence Smoking cessation instruction/counseling given:  counseled patient on the dangers of tobacco use, advised patient to stop smoking, and reviewed strategies to maximize success   Routine Health Maintenance:  Colonoscopy 10 years ago, normal. Recommend a digital rectal exam with hemoccult card Recommend a routine eye examination Will follow up with surgeon next year.     RTC: 3 months for hypertension and right knee pain  Shafin Pollio M, FNP

## 2016-11-02 ENCOUNTER — Encounter: Payer: Self-pay | Admitting: Gastroenterology

## 2017-01-05 MED FILL — CHLORHEXIDINE 0.12% RINSE: 0.12 | 16 days supply | Qty: 473 | Fill #0

## 2017-01-30 ENCOUNTER — Ambulatory Visit (INDEPENDENT_AMBULATORY_CARE_PROVIDER_SITE_OTHER): Payer: Self-pay | Admitting: Family Medicine

## 2017-01-30 ENCOUNTER — Encounter: Payer: Self-pay | Admitting: Family Medicine

## 2017-01-30 VITALS — BP 146/88 | HR 90 | Temp 98.3°F | Resp 14 | Ht 69.0 in | Wt 145.0 lb

## 2017-01-30 DIAGNOSIS — F17200 Nicotine dependence, unspecified, uncomplicated: Secondary | ICD-10-CM

## 2017-01-30 DIAGNOSIS — M25561 Pain in right knee: Secondary | ICD-10-CM

## 2017-01-30 DIAGNOSIS — I10 Essential (primary) hypertension: Secondary | ICD-10-CM

## 2017-01-30 DIAGNOSIS — Z1159 Encounter for screening for other viral diseases: Secondary | ICD-10-CM

## 2017-01-30 LAB — COMPLETE METABOLIC PANEL WITH GFR
ALBUMIN: 4.3 g/dL (ref 3.6–5.1)
ALT: 33 U/L (ref 9–46)
AST: 44 U/L — ABNORMAL HIGH (ref 10–35)
Alkaline Phosphatase: 69 U/L (ref 40–115)
BUN: 13 mg/dL (ref 7–25)
CO2: 22 mmol/L (ref 20–31)
Calcium: 9.6 mg/dL (ref 8.6–10.3)
Chloride: 106 mmol/L (ref 98–110)
Creat: 0.95 mg/dL (ref 0.70–1.25)
GFR, Est African American: >89 mL/min (ref >=60)
GFR, Est Non African American: 85 mL/min (ref >=60)
GLUCOSE: 192 mg/dL — AB (ref 65–99)
POTASSIUM: 5.2 mmol/L (ref 3.5–5.3)
SODIUM: 142 mmol/L (ref 135–146)
Total Bilirubin: 0.4 mg/dL (ref 0.2–1.2)
Total Protein: 6.9 g/dL (ref 6.1–8.1)

## 2017-01-30 LAB — POCT URINALYSIS DIP (DEVICE)
BILIRUBIN URINE: NEGATIVE
Glucose, UA: NEGATIVE mg/dL
HGB URINE DIPSTICK: NEGATIVE
KETONES UR: NEGATIVE mg/dL
Leukocytes, UA: NEGATIVE
NITRITE: NEGATIVE
PH: 5.5 (ref 5.0–8.0)
Protein, ur: NEGATIVE mg/dL
SPECIFIC GRAVITY, URINE: 1.02 (ref 1.005–1.030)
Urobilinogen, UA: 0.2 mg/dL (ref 0.0–1.0)

## 2017-01-30 MED ORDER — AMLODIPINE BESYLATE 5 MG PO TABS: ORAL_TABLET | ORAL | 5 refills | Status: DC

## 2017-01-30 MED ORDER — MELOXICAM 7.5 MG PO TABS: 7.5000 mg | ORAL_TABLET | Freq: Every day | ORAL | 2 refills | Status: DC

## 2017-01-30 MED ORDER — DICLOFENAC SODIUM 1 % TD GEL: 2.0000 g | Freq: Four times a day (QID) | TRANSDERMAL | 2 refills | Status: DC

## 2017-01-30 MED FILL — ?MELOXICAM 7.5 MG TABLET: 7.5 | 30 days supply | Qty: 30 | Fill #0

## 2017-01-30 MED FILL — VOLTAREN 1% GEL: 1 | 8 days supply | Qty: 100 | Fill #0

## 2017-01-30 MED FILL — AMLODIPINE BESYLATE 5 MG TA: 5 | 30 days supply | Qty: 30 | Fill #0

## 2017-01-30 NOTE — Progress Notes (Signed)
Subjective:    Patient ID: David Keith, male    DOB: 1956-07-04, 61 y.o.   MRN: [id number]  [name],a 61 year old male with a history of hypertension and right knee pain presents for a follow up. He has been lost to follow up over the past year. He says that he has been out of antihypertensive medications over the past several months.     Hypertension  This is a chronic problem. The current episode started more than 1 year ago. The problem has been gradually worsening since onset. The problem is controlled. Pertinent negatives include no anxiety, blurred vision, chest pain, headaches, malaise/fatigue, neck pain, orthopnea, palpitations, peripheral edema, PND, shortness of breath or sweats. Risk factors for coronary artery disease include male gender and obesity. The current treatment provides moderate improvement. There are no compliance problems.  There is no history of chronic renal disease, coarctation of the aorta, hyperaldosteronism, hypercortisolism, hyperparathyroidism, a hypertension causing med, pheochromocytoma, renovascular disease, sleep apnea or a thyroid problem.  Knee Pain   The pain is present in the right knee. The quality of the pain is described as shooting. The pain is at a severity of 4/10. The pain is mild. The pain has been fluctuating since onset. Associated symptoms include a loss of motion. Pertinent negatives include no inability to bear weight, loss of sensation, muscle weakness, numbness or tingling. The symptoms are aggravated by movement and weight bearing. He has tried NSAIDs for the symptoms. The treatment provided mild relief.    Past Medical History:  Diagnosis Date  . Chronic back pain    radiculopathy and stenosis  . Hypertension    takes AMlodipine daily  . Joint swelling    right knee  . Weakness    numbness in both legs when walking   Social History   Social History  . Marital status: Married    Spouse name: N/A  . Number of  children: N/A  . Years of education: N/A   Occupational History  . Not on file.   Social History Main Topics  . Smoking status: Current Every Day Smoker    Packs/day: 0.50    Years: 20.00    Types: Cigarettes  . Smokeless tobacco: Never Used     Comment: 3 packs a week  . Alcohol use 1.2 oz/week    2 Cans of beer per week     Comment: daily  . Drug use: No  . Sexual activity: Not on file   Other Topics Concern  . Not on file   Social History Narrative  . No narrative on file   Immunization History  Administered Date(s) Administered  . Pneumococcal Polysaccharide-23 02/08/2016  . Tdap 02/08/2016   Review of Systems  Constitutional: Negative.  Negative for fatigue and malaise/fatigue.  HENT: Negative.   Eyes: Negative.  Negative for blurred vision and visual disturbance.  Respiratory: Negative.  Negative for shortness of breath.   Cardiovascular: Negative.  Negative for chest pain, palpitations, orthopnea and PND.  Endocrine: Negative.  Negative for polydipsia, polyphagia and polyuria.  Genitourinary: Negative.   Musculoskeletal: Positive for joint swelling and myalgias (right knee pain). Negative for neck pain.  Skin: Negative.   Allergic/Immunologic: Negative.   Neurological: Negative.  Negative for tingling, numbness and headaches.  Hematological: Negative.   Psychiatric/Behavioral: Negative.        Objective:   Physical Exam  Constitutional: He is oriented to person, place, and time. He appears well-developed and well-nourished.  HENT:  Head: Normocephalic and atraumatic.  Right Ear: External ear normal.  Left Ear: External ear normal.  Nose: Nose normal.  Mouth/Throat: Oropharynx is clear and moist.  Eyes: Conjunctivae and EOM are normal. Pupils are equal, round, and reactive to light.  Neck: Normal range of motion. Neck supple.  Cardiovascular: Normal rate, regular rhythm, normal heart sounds and intact distal pulses.   Abdominal: Soft. Bowel sounds are  normal.  Musculoskeletal:       Right knee: He exhibits decreased range of motion and swelling.  Neurological: He is oriented to person, place, and time. He has normal reflexes.  Skin: Skin is warm and dry.  Psychiatric: He has a normal mood and affect. His behavior is normal. Judgment and thought content normal.      BP (!) 146/88 (BP Location: Left Arm, Patient Position: Sitting, Cuff Size: Normal) Comment: manual  Pulse 90   Temp 98.3 F (36.8 C) (Oral)   Resp 14   Ht 5\' 9"  (1.753 m)   Wt 145 lb (65.8 kg)   SpO2 97%   BMI 21.41 kg/m  Assessment & Plan:  1. Essential hypertension Blood pressure is above goal on current medication regimen. Will Amlodipine 5 mg daily.  - amLODipine (NORVASC) 5 MG tablet; TAKE 1 TABLET BY MOUTH EVERY DAY.  Dispense: 30 tablet; Refill: 5 - COMPLETE METABOLIC PANEL WITH GFR  2. Acute pain of right knee  - meloxicam (MOBIC) 7.5 MG tablet; Take 1 tablet (7.5 mg total) by mouth daily.  Dispense: 30 tablet; Refill: 2 - diclofenac sodium (VOLTAREN) 1 % GEL; Apply 2 g topically 4 (four) times daily.  Dispense: 1 Tube; Refill: 2  3. Need for hepatitis C screening test  - Hepatitis C Antibody  4. Smoking Smoking cessation instruction/counseling given:  counseled patient on the dangers of tobacco use, advised patient to stop smoking, and reviewed strategies to maximize success    RTC: 6 months for hypertension  Nolon Nations  MSN, FNP-C [facility] [address] [facility] [address] [place], [place] [address] [phone]

## 2017-01-30 NOTE — Patient Instructions (Addendum)
Will restart Amlodipine 5 mg daily.  Right knee pain: Meloxicam 7.5 mg daily.  Diclofenac gel 4 times daily as needed.  DASH Eating Plan DASH stands for "Dietary Approaches to Stop Hypertension." The DASH eating plan is a healthy eating plan that has been shown to reduce high blood pressure (hypertension). It may also reduce your risk for type 2 diabetes, heart disease, and stroke. The DASH eating plan may also help with weight loss. What are tips for following this plan? General guidelines   Avoid eating more than 2,300 mg (milligrams) of salt (sodium) a day. If you have hypertension, you may need to reduce your sodium intake to 1,500 mg a day.  Limit alcohol intake to no more than 1 drink a day for nonpregnant women and 2 drinks a day for men. One drink equals 12 oz of beer, 5 oz of wine, or 1 oz of hard liquor.  Work with your health care provider to maintain a healthy body weight or to lose weight. Ask what an ideal weight is for you.  Get at least 30 minutes of exercise that causes your heart to beat faster (aerobic exercise) most days of the week. Activities may include walking, swimming, or biking.  Work with your health care provider or diet and nutrition specialist (dietitian) to adjust your eating plan to your individual calorie needs. Reading food labels   Check food labels for the amount of sodium per serving. Choose foods with less than 5 percent of the Daily Value of sodium. Generally, foods with less than 300 mg of sodium per serving fit into this eating plan.  To find whole grains, look for the word "whole" as the first word in the ingredient list. Shopping   Buy products labeled as "low-sodium" or "no salt added."  Buy fresh foods. Avoid canned foods and premade or frozen meals. Cooking   Avoid adding salt when cooking. Use salt-free seasonings or herbs instead of table salt or sea salt. Check with your health care provider or pharmacist before using salt  substitutes.  Do not fry foods. Cook foods using healthy methods such as baking, boiling, grilling, and broiling instead.  Cook with heart-healthy oils, such as olive, canola, soybean, or sunflower oil. Meal planning    Eat a balanced diet that includes:  5 or more servings of fruits and vegetables each day. At each meal, try to fill half of your plate with fruits and vegetables.  Up to 6-8 servings of whole grains each day.  Less than 6 oz of lean meat, poultry, or fish each day. A 3-oz serving of meat is about the same size as a deck of cards. One egg equals 1 oz.  2 servings of low-fat dairy each day.  A serving of nuts, seeds, or beans 5 times each week.  Heart-healthy fats. Healthy fats called Omega-3 fatty acids are found in foods such as flaxseeds and coldwater fish, like sardines, salmon, and mackerel.  Limit how much you eat of the following:  Canned or prepackaged foods.  Food that is high in trans fat, such as fried foods.  Food that is high in saturated fat, such as fatty meat.  Sweets, desserts, sugary drinks, and other foods with added sugar.  Full-fat dairy products.  Do not salt foods before eating.  Try to eat at least 2 vegetarian meals each week.  Eat more home-cooked food and less restaurant, buffet, and fast food.  When eating at a restaurant, ask that your food be  prepared with less salt or no salt, if possible. What foods are recommended? The items listed may not be a complete list. Talk with your dietitian about what dietary choices are best for you. Grains  Whole-grain or whole-wheat bread. Whole-grain or whole-wheat pasta. Brown rice. Orpah Cobbatmeal. Quinoa. Bulgur. Whole-grain and low-sodium cereals. Pita bread. Low-fat, low-sodium crackers. Whole-wheat flour tortillas. Vegetables  Fresh or frozen vegetables (raw, steamed, roasted, or grilled). Low-sodium or reduced-sodium tomato and vegetable juice. Low-sodium or reduced-sodium tomato sauce and  tomato paste. Low-sodium or reduced-sodium canned vegetables. Fruits  All fresh, dried, or frozen fruit. Canned fruit in natural juice (without added sugar). Meat and other protein foods  Skinless chicken or Malawiturkey. Ground chicken or Mal[place]iturkey. Pork with fat trimmed off. Fish and seafood. Egg whites. Dried beans, peas, or lentils. Unsalted nuts, nut butters, and seeds. Unsalted canned beans. Lean cuts of beef with fat trimmed off. Low-sodium, lean deli meat. Dairy  Low-fat (1%) or fat-free (skim) milk. Fat-free, low-fat, or reduced-fat cheeses. Nonfat, low-sodium ricotta or cottage cheese. Low-fat or nonfat yogurt. Low-fat, low-sodium cheese. Fats and oils  Soft margarine without trans fats. Vegetable oil. Low-fat, reduced-fat, or light mayonnaise and salad dressings (reduced-sodium). Canola, safflower, olive, soybean, and sunflower oils. Avocado. Seasoning and other foods  Herbs. Spices. Seasoning mixes without salt. Unsalted popcorn and pretzels. Fat-free sweets. What foods are not recommended? The items listed may not be a complete list. Talk with your dietitian about what dietary choices are best for you. Grains  Baked goods made with fat, such as croissants, muffins, or some breads. Dry pasta or rice meal packs. Vegetables  Creamed or fried vegetables. Vegetables in a cheese sauce. Regular canned vegetables (not low-sodium or reduced-sodium). Regular canned tomato sauce and paste (not low-sodium or reduced-sodium). Regular tomato and vegetable juice (not low-sodium or reduced-sodium). Rosita FirePickles. Olives. Fruits  Canned fruit in a light or heavy syrup. Fried fruit. Fruit in cream or butter sauce. Meat and other protein foods  Fatty cuts of meat. Ribs. Fried meat. Tomasa BlaseBacon. Sausage. Bologna and other processed lunch meats. Salami. Fatback. Hotdogs. Bratwurst. Salted nuts and seeds. Canned beans with added salt. Canned or smoked fish. Whole eggs or egg yolks. Chicken or Malawiturkey with skin. Dairy  Whole  or 2% milk, cream, and half-and-half. Whole or full-fat cream cheese. Whole-fat or sweetened yogurt. Full-fat cheese. Nondairy creamers. Whipped toppings. Processed cheese and cheese spreads. Fats and oils  Butter. Stick margarine. Lard. Shortening. Ghee. Bacon fat. Tropical oils, such as coconut, palm kernel, or palm oil. Seasoning and other foods  Salted popcorn and pretzels. Onion salt, garlic salt, seasoned salt, table salt, and sea salt. Worcestershire sauce. Tartar sauce. Barbecue sauce. Teriyaki sauce. Soy sauce, including reduced-sodium. Steak sauce. Canned and packaged gravies. Fish sauce. Oyster sauce. Cocktail sauce. Horseradish that you find on the shelf. Ketchup. Mustard. Meat flavorings and tenderizers. Bouillon cubes. Hot sauce and Tabasco sauce. Premade or packaged marinades. Premade or packaged taco seasonings. Relishes. Regular salad dressings. Where to find more information:  National Heart, Lung, and Blood Institute: PopSteam.iswww.nhlbi.nih.gov  American Heart Association: www.heart.org Summary  The DASH eating plan is a healthy eating plan that has been shown to reduce high blood pressure (hypertension). It may also reduce your risk for type 2 diabetes, heart disease, and stroke.  With the DASH eating plan, you should limit salt (sodium) intake to 2,300 mg a day. If you have hypertension, you may need to reduce your sodium intake to 1,500 mg a day.  When on  the DASH eating plan, aim to eat more fresh fruits and vegetables, whole grains, lean proteins, low-fat dairy, and heart-healthy fats.  Work with your health care provider or diet and nutrition specialist (dietitian) to adjust your eating plan to your individual calorie needs. This information is not intended to replace advice given to you by your health care provider. Make sure you discuss any questions you have with your health care provider. Document Released: 08/31/2011 Document Revised: 09/04/2016 Document Reviewed:  09/04/2016 Elsevier Interactive Patient Education  2017 [employer].

## 2017-01-31 LAB — HEPATITIS C ANTIBODY: HCV Ab: REACTIVE — AB

## 2017-02-01 ENCOUNTER — Ambulatory Visit: Payer: Self-pay | Attending: Internal Medicine

## 2017-02-01 LAB — HEPATITIS C RNA QUANTITATIVE
HCV Quantitative Log: 7 Log IU/mL — ABNORMAL HIGH
HCV Quantitative: 9930000 IU/mL — ABNORMAL HIGH

## 2017-03-13 ENCOUNTER — Telehealth: Payer: Self-pay

## 2017-03-13 DIAGNOSIS — M25561 Pain in right knee: Secondary | ICD-10-CM

## 2017-03-13 DIAGNOSIS — I10 Essential (primary) hypertension: Secondary | ICD-10-CM

## 2017-03-13 MED ORDER — AMLODIPINE BESYLATE 5 MG PO TABS: ORAL_TABLET | ORAL | 5 refills | Status: DC

## 2017-03-13 MED ORDER — MELOXICAM 7.5 MG PO TABS: 7.5000 mg | ORAL_TABLET | Freq: Every day | ORAL | 2 refills | Status: DC

## 2017-03-13 NOTE — Telephone Encounter (Signed)
These medications have been refilled and sent into pharmacy. Thanks!  

## 2017-04-30 MED FILL — AMLODIPINE BESYLATE 5 MG TA: 5 | 30 days supply | Qty: 30 | Fill #1

## 2017-04-30 MED FILL — MELOXICAM 7.5 MG TABLET: 7.5 | 30 days supply | Qty: 30 | Fill #1

## 2017-04-30 MED FILL — VOLTAREN 1% GEL: 1 | 8 days supply | Qty: 100 | Fill #1

## 2017-05-07 ENCOUNTER — Ambulatory Visit: Payer: Self-pay | Attending: Internal Medicine

## 2017-06-11 ENCOUNTER — Telehealth: Payer: Self-pay

## 2017-06-11 DIAGNOSIS — I10 Essential (primary) hypertension: Secondary | ICD-10-CM

## 2017-06-11 MED ORDER — AMLODIPINE BESYLATE 5 MG PO TABS: ORAL_TABLET | ORAL | 5 refills | Status: DC

## 2017-06-11 NOTE — Telephone Encounter (Signed)
Refill for amlodipine sent into pharmacy. Thanks!  

## 2017-07-31 ENCOUNTER — Telehealth: Payer: Self-pay

## 2017-07-31 DIAGNOSIS — M25561 Pain in right knee: Secondary | ICD-10-CM

## 2017-07-31 DIAGNOSIS — I10 Essential (primary) hypertension: Secondary | ICD-10-CM

## 2017-07-31 MED ORDER — MELOXICAM 7.5 MG PO TABS: 7.5000 mg | ORAL_TABLET | Freq: Every day | ORAL | 2 refills | Status: DC

## 2017-07-31 MED ORDER — AMLODIPINE BESYLATE 5 MG PO TABS: ORAL_TABLET | ORAL | 5 refills | Status: DC

## 2017-07-31 NOTE — Telephone Encounter (Signed)
Refill for mobic and amlodipine sent into pharmacy. Thanks!

## 2017-08-02 ENCOUNTER — Ambulatory Visit: Payer: No Typology Code available for payment source | Admitting: Family Medicine

## 2017-08-03 ENCOUNTER — Encounter: Payer: Self-pay | Admitting: Family Medicine

## 2017-08-03 ENCOUNTER — Ambulatory Visit (INDEPENDENT_AMBULATORY_CARE_PROVIDER_SITE_OTHER): Payer: No Typology Code available for payment source | Admitting: Family Medicine

## 2017-08-03 VITALS — BP 152/90 | HR 88 | Temp 98.6°F | Resp 16 | Ht 69.0 in | Wt 141.0 lb

## 2017-08-03 DIAGNOSIS — G8929 Other chronic pain: Secondary | ICD-10-CM

## 2017-08-03 DIAGNOSIS — B1920 Unspecified viral hepatitis C without hepatic coma: Secondary | ICD-10-CM

## 2017-08-03 DIAGNOSIS — M25561 Pain in right knee: Secondary | ICD-10-CM

## 2017-08-03 DIAGNOSIS — I10 Essential (primary) hypertension: Secondary | ICD-10-CM

## 2017-08-03 DIAGNOSIS — R824 Acetonuria: Secondary | ICD-10-CM

## 2017-08-03 DIAGNOSIS — F17200 Nicotine dependence, unspecified, uncomplicated: Secondary | ICD-10-CM

## 2017-08-03 LAB — LIPID PANEL
CHOL/HDL RATIO: 1.6 (calc) (ref <5.0)
Cholesterol: 171 mg/dL (ref <200)
HDL: 110 mg/dL (ref >40)
LDL CHOLESTEROL (CALC): 48 mg/dL
Non-HDL Cholesterol (Calc): 61 mg/dL (calc) (ref <130)
TRIGLYCERIDES: 44 mg/dL (ref <150)

## 2017-08-03 LAB — COMPLETE METABOLIC PANEL WITH GFR
AG RATIO: 1.9 (calc) (ref 1.0–2.5)
ALBUMIN MSPROF: 4.8 g/dL (ref 3.6–5.1)
ALT: 35 U/L (ref 9–46)
AST: 46 U/L — AB (ref 10–35)
Alkaline phosphatase (APISO): 67 U/L (ref 40–115)
BILIRUBIN TOTAL: 0.5 mg/dL (ref 0.2–1.2)
BUN: 11 mg/dL (ref 7–25)
CHLORIDE: 105 mmol/L (ref 98–110)
CO2: 24 mmol/L (ref 20–32)
CREATININE: 0.83 mg/dL (ref 0.70–1.25)
Calcium: 9.5 mg/dL (ref 8.6–10.3)
GFR, Est African American: 109 mL/min/{1.73_m2} (ref >=60)
GFR, Est Non African American: 94 mL/min/{1.73_m2} (ref >=60)
Globulin: 2.5 g/dL (calc) (ref 1.9–3.7)
Glucose, Bld: 84 mg/dL (ref 65–99)
POTASSIUM: 4.5 mmol/L (ref 3.5–5.3)
Sodium: 140 mmol/L (ref 135–146)
Total Protein: 7.3 g/dL (ref 6.1–8.1)

## 2017-08-03 LAB — GLUCOSE, CAPILLARY: Glucose-Capillary: 85 mg/dL (ref 65–99)

## 2017-08-03 LAB — POCT URINALYSIS DIP (DEVICE)
Bilirubin Urine: NEGATIVE
Glucose, UA: NEGATIVE mg/dL
Hgb urine dipstick: NEGATIVE
Ketones, ur: 15 mg/dL — AB
Leukocytes, UA: NEGATIVE
NITRITE: NEGATIVE
PROTEIN: NEGATIVE mg/dL
Specific Gravity, Urine: 1.025 (ref 1.005–1.030)
UROBILINOGEN UA: 0.2 mg/dL (ref 0.0–1.0)
pH: 5.5 (ref 5.0–8.0)

## 2017-08-03 MED ORDER — AMLODIPINE BESYLATE 10 MG PO TABS: ORAL_TABLET | ORAL | 5 refills | Status: DC

## 2017-08-03 MED ORDER — MELOXICAM 7.5 MG PO TABS: 7.5000 mg | ORAL_TABLET | Freq: Every day | ORAL | 2 refills | Status: DC

## 2017-08-03 NOTE — Progress Notes (Signed)
Subjective:    Patient ID: David Keith, male    DOB: Apr 16, 1956, 61 y.o.   MRN: [id number]  [name],a 61 year old male with a history of hypertension, hepatitis C and right knee pain presents for a six-month follow-up.  Patient has been taking all prescribed medications consistently.  He does not check blood pressures at home.  He also does not follow a low-fat, low-sodium diet or exercise routinely.  During previous appointment patient was screened for hepatitis C. hepatitis C was confirmed positive and a referral was sent to infectious disease for further workup and evaluation.  Patient has not been scheduled for an appointment. Patient also has a history of right knee pain related to ongoing arthritis.  He has been taking meloxicam without sustained relief.   Hypertension  This is a chronic problem. The current episode started more than 1 year ago. The problem has been gradually worsening since onset. The problem is controlled. Pertinent negatives include no anxiety, blurred vision, chest pain, headaches, malaise/fatigue, neck pain, orthopnea, palpitations, peripheral edema, PND, shortness of breath or sweats. Risk factors for coronary artery disease include male gender and obesity. The current treatment provides moderate improvement. There are no compliance problems.  There is no history of chronic renal disease, coarctation of the aorta, hyperaldosteronism, hypercortisolism, hyperparathyroidism, a hypertension causing med, pheochromocytoma, renovascular disease, sleep apnea or a thyroid problem.  Knee Pain   The pain is present in the right knee. The quality of the pain is described as shooting. The pain is at a severity of 4/10. The pain is mild. The pain has been fluctuating since onset. Associated symptoms include a loss of motion. Pertinent negatives include no inability to bear weight, loss of sensation, muscle weakness, numbness or tingling. The symptoms are aggravated by  movement and weight bearing. He has tried NSAIDs for the symptoms. The treatment provided mild relief.    Past Medical History:  Diagnosis Date  . Chronic back pain    radiculopathy and stenosis  . Hypertension    takes AMlodipine daily  . Joint swelling    right knee  . Weakness    numbness in both legs when walking   Social History   Socioeconomic History  . Marital status: Married    Spouse name: Not on file  . Number of children: Not on file  . Years of education: Not on file  . Highest education level: Not on file  Social Needs  . Financial resource strain: Not on file  . Food insecurity - worry: Not on file  . Food insecurity - inability: Not on file  . Transportation needs - medical: Not on file  . Transportation needs - non-medical: Not on file  Occupational History  . Not on file  Tobacco Use  . Smoking status: Current Every Day Smoker    Packs/day: 0.50    Years: 20.00    Pack years: 10.00    Types: Cigarettes  . Smokeless tobacco: Never Used  . Tobacco comment: 3 packs a week  Substance and Sexual Activity  . Alcohol use: Yes    Alcohol/week: 1.2 oz    Types: 2 Cans of beer per week    Comment: daily  . Drug use: No  . Sexual activity: Not on file  Other Topics Concern  . Not on file  Social History Narrative  . Not on file   Immunization History  Administered Date(s) Administered  . Pneumococcal Polysaccharide-23 02/08/2016  . Tdap 02/08/2016  Review of Systems  Constitutional: Negative.  Negative for fatigue and malaise/fatigue.  HENT: Negative.   Eyes: Negative.  Negative for blurred vision and visual disturbance.  Respiratory: Negative.  Negative for shortness of breath.   Cardiovascular: Negative.  Negative for chest pain, palpitations, orthopnea and PND.  Endocrine: Negative.  Negative for polydipsia, polyphagia and polyuria.  Genitourinary: Negative.   Musculoskeletal: Positive for joint swelling and myalgias (right knee pain).  Negative for neck pain.  Skin: Negative.   Allergic/Immunologic: Negative.   Neurological: Negative.  Negative for tingling, numbness and headaches.  Hematological: Negative.   Psychiatric/Behavioral: Negative.        Objective:   Physical Exam  Constitutional: He is oriented to person, place, and time. He appears well-developed and well-nourished.  HENT:  Head: Normocephalic and atraumatic.  Right Ear: External ear normal.  Left Ear: External ear normal.  Nose: Nose normal.  Mouth/Throat: Oropharynx is clear and moist.  Eyes: Conjunctivae and EOM are normal. Pupils are equal, round, and reactive to light.  Neck: Normal range of motion. Neck supple.  Cardiovascular: Normal rate, regular rhythm, normal heart sounds and intact distal pulses.  Abdominal: Soft. Bowel sounds are normal.  Musculoskeletal:       Right knee: He exhibits decreased range of motion and swelling.  Neurological: He is oriented to person, place, and time. He has normal reflexes.  Skin: Skin is warm and dry.  Psychiatric: He has a normal mood and affect. His behavior is normal. Judgment and thought content normal.      BP (!) 152/90 Comment: manually  Pulse 88   Temp 98.6 F (37 C) (Oral)   Resp 16   Ht 5\' 9"  (1.753 m)   Wt 141 lb (64 kg)   SpO2 100%   BMI 20.82 kg/m  Assessment & Plan:  1. Essential hypertension Blood pressure is above goal on current medication regimen.  Will increase amlodipine to 10 mg daily. Will review renal functioning as results become available.  Review urinalysis, no proteinuria present. - amLODipine (NORVASC) 10 MG tablet; TAKE 1 TABLET BY MOUTH EVERY DAY.  Dispense: 30 tablet; Refill: 5 - Lipid Panel - COMPLETE METABOLIC PANEL WITH GFR  2. Hepatitis C virus infection without hepatic coma, unspecified chronicity Patient warrants referral to infectious disease for further workup and evaluation. - Ambulatory referral to Infectious Disease  3. Tobacco dependence Patient  continues to smoke up to 1 pack of cigarettes per day, discussed smoking cessation at length.  Smoking cessation instruction/counseling given:  counseled patient on the dangers of tobacco use, advised patient to stop smoking, and reviewed strategies to maximize success  4. Chronic pain of right knee Will continue meloxicam 7.5 mg daily.  I will send a referral to sports medicine for consult and will defer to specialist for further treatment and evaluation. - Ambulatory referral to Sports Medicine  - meloxicam (MOBIC) 7.5 MG tablet; Take 1 tablet (7.5 mg total) daily by mouth.  Dispense: 30 tablet; Refill: 2   5. Urine ketones - Glucose, capillary    Nolon NationsLaChina Moore Hanako Tipping  MSN, FNP-C Patient [facility] [address]  [place], [place] [id number] [phone]

## 2017-08-03 NOTE — Patient Instructions (Signed)
Your blood pressure is above goal today will increase amlodipine to 10 mg daily.  Continue to follow a low-sodium, low-fat diet divided over 5-6 small meals throughout the day.  Given written information on DASH diet.  Will follow-up by phone with any abnormal laboratory results.  Recent referral to infectious disease.  If you have not heard from infectious disease within 1 week, please give us a call back. DASH Eating Plan DASH stands for "Dietary Approaches to Stop Hypertension." The DASH eating plan is a healthy eating plan that has been shown to reduce high blood pressure (hypertension). It may also reduce your risk for type 2 diabetes, heart disease, and stroke. The DASH eating plan may also help with weight loss. What are tips for following this plan? General guidelines  Avoid eating more than 2,300 mg (milligrams) of salt (sodium) a day. If you have hypertension, you may need to reduce your sodium intake to 1,500 mg a day.  Limit alcohol intake to no more than 1 drink a day for nonpregnant women and 2 drinks a day for men. One drink equals 12 oz of beer, 5 oz of wine, or 1 oz of hard liquor.  Work with your health care provider to maintain a healthy body weight or to lose weight. Ask what an ideal weight is for you.  Get at least 30 minutes of exercise that causes your heart to beat faster (aerobic exercise) most days of the week. Activities may include walking, swimming, or biking.  Work with your health care provider or diet and nutrition specialist (dietitian) to adjust your eating plan to your individual calorie needs. Reading food labels  Check food labels for the amount of sodium per serving. Choose foods with less than 5 percent of the Daily Value of sodium. Generally, foods with less than 300 mg of sodium per serving fit into this eating plan.  To find whole grains, look for the word "whole" as the first word in the ingredient list. Shopping  Buy products labeled as  "low-sodium" or "no salt added."  Buy fresh foods. Avoid canned foods and premade or frozen meals. Cooking  Avoid adding salt when cooking. Use salt-free seasonings or herbs instead of table salt or sea salt. Check with your health care provider or pharmacist before using salt substitutes.  Do not fry foods. Cook foods using healthy methods such as baking, boiling, grilling, and broiling instead.  Cook with heart-healthy oils, such as olive, canola, soybean, or sunflower oil. Meal planning   Eat a balanced diet that includes: ? 5 or more servings of fruits and vegetables each day. At each meal, try to fill half of your plate with fruits and vegetables. ? Up to 6-8 servings of whole grains each day. ? Less than 6 oz of lean meat, poultry, or fish each day. A 3-oz serving of meat is about the same size as a deck of cards. One egg equals 1 oz. ? 2 servings of low-fat dairy each day. ? A serving of nuts, seeds, or beans 5 times each week. ? Heart-healthy fats. Healthy fats called Omega-3 fatty acids are found in foods such as flaxseeds and coldwater fish, like sardines, salmon, and mackerel.  Limit how much you eat of the following: ? Canned or prepackaged foods. ? Food that is high in trans fat, such as fried foods. ? Food that is high in saturated fat, such as fatty meat. ? Sweets, desserts, sugary drinks, and other foods with added sugar. ? Full-fat  dairy products.  Do not salt foods before eating.  Try to eat at least 2 vegetarian meals each week.  Eat more home-cooked food and less restaurant, buffet, and fast food.  When eating at a restaurant, ask that your food be prepared with less salt or no salt, if possible. What foods are recommended? The items listed may not be a complete list. Talk with your dietitian about what dietary choices are best for you. Grains Whole-grain or whole-wheat bread. Whole-grain or whole-wheat pasta. Brown rice. [place]. Bulgur. Whole-grain  and low-sodium cereals. Pita bread. Low-fat, low-sodium crackers. Whole-wheat flour tortillas. Vegetables Fresh or frozen vegetables (raw, steamed, roasted, or grilled). Low-sodium or reduced-sodium tomato and vegetable juice. Low-sodium or reduced-sodium tomato sauce and tomato paste. Low-sodium or reduced-sodium canned vegetables. Fruits All fresh, dried, or frozen fruit. Canned fruit in natural juice (without added sugar). Meat and other protein foods Skinless chicken or [place]. Ground chicken or [place]. Pork with fat trimmed off. Fish and seafood. Egg whites. Dried beans, peas, or lentils. Unsalted nuts, nut butters, and seeds. Unsalted canned beans. Lean cuts of beef with fat trimmed off. Low-sodium, lean deli meat. Dairy Low-fat (1%) or fat-free (skim) milk. Fat-free, low-fat, or reduced-fat cheeses. Nonfat, low-sodium ricotta or cottage cheese. Low-fat or nonfat yogurt. Low-fat, low-sodium cheese. Fats and oils Soft margarine without trans fats. Vegetable oil. Low-fat, reduced-fat, or light mayonnaise and salad dressings (reduced-sodium). Canola, safflower, olive, soybean, and sunflower oils. Avocado. Seasoning and other foods Herbs. Spices. Seasoning mixes without salt. Unsalted popcorn and pretzels. Fat-free sweets. What foods are not recommended? The items listed may not be a complete list. Talk with your dietitian about what dietary choices are best for you. Grains Baked goods made with fat, such as croissants, muffins, or some breads. Dry pasta or rice meal packs. Vegetables Creamed or fried vegetables. Vegetables in a cheese sauce. Regular canned vegetables (not low-sodium or reduced-sodium). Regular canned tomato sauce and paste (not low-sodium or reduced-sodium). Regular tomato and vegetable juice (not low-sodium or reduced-sodium). Angie Fava. Olives. Fruits Canned fruit in a light or heavy syrup. Fried fruit. Fruit in cream or butter sauce. Meat and other protein foods Fatty cuts  of meat. Ribs. Fried meat. Berniece Salines. Sausage. Bologna and other processed lunch meats. Salami. Fatback. Hotdogs. Bratwurst. Salted nuts and seeds. Canned beans with added salt. Canned or smoked fish. Whole eggs or egg yolks. Chicken or [place] with skin. Dairy Whole or 2% milk, cream, and half-and-half. Whole or full-fat cream cheese. Whole-fat or sweetened yogurt. Full-fat cheese. Nondairy creamers. Whipped toppings. Processed cheese and cheese spreads. Fats and oils Butter. Stick margarine. Lard. Shortening. Ghee. Bacon fat. Tropical oils, such as coconut, palm kernel, or palm oil. Seasoning and other foods Salted popcorn and pretzels. Onion salt, garlic salt, seasoned salt, table salt, and sea salt. Worcestershire sauce. Tartar sauce. Barbecue sauce. Teriyaki sauce. Soy sauce, including reduced-sodium. Steak sauce. Canned and packaged gravies. Fish sauce. Oyster sauce. Cocktail sauce. Horseradish that you find on the shelf. Ketchup. Mustard. Meat flavorings and tenderizers. Bouillon cubes. Hot sauce and Tabasco sauce. Premade or packaged marinades. Premade or packaged taco seasonings. Relishes. Regular salad dressings. Where to find more information:  National Heart, Lung, and [place]: https://wilson-eaton.com/  American Heart Association: www.heart.org Summary  The DASH eating plan is a healthy eating plan that has been shown to reduce high blood pressure (hypertension). It may also reduce your risk for type 2 diabetes, heart disease, and stroke.  With the DASH eating plan, you should  limit salt (sodium) intake to 2,300 mg a day. If you have hypertension, you may need to reduce your sodium intake to 1,500 mg a day.  When on the DASH eating plan, aim to eat more fresh fruits and vegetables, whole grains, lean proteins, low-fat dairy, and heart-healthy fats.  Work with your health care provider or diet and nutrition specialist (dietitian) to adjust your eating plan to your individual calorie  needs. This information is not intended to replace advice given to you by your health care provider. Make sure you discuss any questions you have with your health care provider. Document Released: 08/31/2011 Document Revised: 09/04/2016 Document Reviewed: 09/04/2016 Elsevier Interactive Patient Education  2017 [employer]

## 2017-08-06 ENCOUNTER — Ambulatory Visit: Payer: No Typology Code available for payment source | Attending: Internal Medicine

## 2017-08-20 ENCOUNTER — Ambulatory Visit: Payer: Self-pay | Admitting: Sports Medicine

## 2017-08-21 ENCOUNTER — Ambulatory Visit (INDEPENDENT_AMBULATORY_CARE_PROVIDER_SITE_OTHER): Payer: Self-pay | Admitting: Family Medicine

## 2017-08-21 DIAGNOSIS — M25561 Pain in right knee: Secondary | ICD-10-CM

## 2017-08-21 DIAGNOSIS — M25469 Effusion, unspecified knee: Secondary | ICD-10-CM

## 2017-08-21 DIAGNOSIS — G8929 Other chronic pain: Secondary | ICD-10-CM

## 2017-08-21 DIAGNOSIS — B1920 Unspecified viral hepatitis C without hepatic coma: Secondary | ICD-10-CM

## 2017-08-21 MED ORDER — METHYLPREDNISOLONE ACETATE 40 MG/ML IJ SUSP
40.0000 mg | Freq: Once | INTRAMUSCULAR | Status: AC
  Administered 2017-08-21: 40 mg via INTRA_ARTICULAR

## 2017-08-23 DIAGNOSIS — B1920 Unspecified viral hepatitis C without hepatic coma: Secondary | ICD-10-CM | POA: Insufficient documentation

## 2017-08-23 NOTE — Progress Notes (Signed)
  David Keith - 61 y.o. male MRN [id number]  Date of birth: 1956-01-29    SUBJECTIVE:      Chief Complaint:/ HPI:   Chronic right knee pain that has gotten worse in the last 6-8 weeks. Having difficulty walking without pain. Also having more swelling. Has had problems with this knee for many years. Last corticosteroid injection was about a year ago. He is not sure how long that helped but is pretty sure that it did improve his function for a while. He is not having any pain awakens him at night unless he has to get up and go to the bathroom and then he has difficulty falling back to sleep because the knee is bothering him so much. Pain located within the knee, 8-9 out of 10 with weightbearing. Pain lasts when he is not during weight. Feels unstable. Has had no falls. No locking. The knee swells sometimes is warm and red. Ice and heating pad do not seem to make it better   ROS:     No unusual weight change. No fever. No other unusual arthralgias. No rash.  PERTINENT  PMH / PSH FH / / SH:  Past Medical, Surgical, Social, and Family History Reviewed & Updated in the EMR.  Pertinent findings include:  #1. Hypertension #2. Current smoker #3. History of chronic low back pain. #4. History of knee arthroscopy in 2011 on the right knee. #5. History of hepatitis C.  OBJECTIVE: BP (!) 152/90   Ht 5\' 9"  (1.753 m)   Wt 146 lb (66.2 kg)   BMI 21.56 kg/m   Physical Exam:  Vital signs are reviewed. GEN.: Well-developed male no acute distress KNEES: Asymmetrical. The right knee has significant synovial hypertrophy and deformity compared with the left. He lacks full extension on the right knee by 5. He has full flexion. Mild crepitus noted on full extension. He is ligamentously intact to varus and valgus stress. Anterior drawer is normal. Popliteal space is benign. The calf is soft. He has medial greater than lateral joint line tenderness. VASCULAR: Dorsalis pedis pulses are 2+ bilaterally  symmetrical. SKIN: The right knee skin area is without any sign of rash. Slight increase in warmth of that knee compared with the left. NEURO: Intact sensation bilateral lower extremity to soft touch.  PROCEDURE: INJECTION: Patient was given informed consent, signed copy in the chart. Appropriate time out was taken. Area prepped and draped in usual sterile fashion. Ethyl chloride was  used for local anesthesia. A 21 gauge 1 1/2 inch needle was used.. 1 cc of methylprednisolone 40 mg/ml plus  4 cc of 1% lidocaine without epinephrine was injected into the right knee using a(n) anterior medial approach.   The patient tolerated the procedure well. There were no complications. Post procedure instructions were given.   ASSESSMENT & PLAN:  See problem based charting & AVS for pt instructions.

## 2017-08-23 NOTE — Assessment & Plan Note (Signed)
Corticosteroid injection today. I would like him to follow-up by phone in one to 2 weeks and let me know if he had improvement with this. We have no recent imaging. At next office visit we'll consider that. We discussed frequency of corticosteroid injections. Also discussed next steps if he has no relief from this. We'll be happy see him back at any point.

## 2017-08-28 ENCOUNTER — Encounter: Payer: No Typology Code available for payment source | Admitting: Internal Medicine

## 2017-09-13 ENCOUNTER — Telehealth: Payer: Self-pay

## 2017-09-13 DIAGNOSIS — G8929 Other chronic pain: Secondary | ICD-10-CM

## 2017-09-13 DIAGNOSIS — I10 Essential (primary) hypertension: Secondary | ICD-10-CM

## 2017-09-13 DIAGNOSIS — M25561 Pain in right knee: Principal | ICD-10-CM

## 2017-09-13 MED ORDER — AMLODIPINE BESYLATE 10 MG PO TABS: ORAL_TABLET | ORAL | 5 refills | Status: DC

## 2017-09-13 MED ORDER — MELOXICAM 7.5 MG PO TABS: 7.5000 mg | ORAL_TABLET | Freq: Every day | ORAL | 2 refills | Status: DC

## 2017-09-13 NOTE — Telephone Encounter (Signed)
Refill sent into pharmacy. Thanks!  

## 2017-10-02 MED FILL — AMLODIPINE BESYLATE 10 MG T: 10 | 30 days supply | Qty: 30 | Fill #0 | Status: TO

## 2017-10-02 MED FILL — MELOXICAM 7.5 MG TABLET: 7.5 | 30 days supply | Qty: 30 | Fill #0 | Status: TO

## 2017-12-03 ENCOUNTER — Ambulatory Visit: Payer: Self-pay

## 2017-12-03 NOTE — Telephone Encounter (Signed)
Pt calling with c/o upper rib pain after falling Friday. Pt rates pain 3-4 especially with laughing or coughing. States pain comes and goes. Appt made for tomorrow with Dr Clelia CroftShaw. Pt has not been seen in 3 years and had previously been seen at [facility].   Reason for Disposition . [1] Chest wall swelling, bruise or pain from minor injury AND [2] present < 7 days . [1] High-risk adult (e.g., age > 3160, osteoporosis, chronic steroid use) AND [2] still hurts  Answer Assessment - Initial Assessment Questions 1. LOCATION: "Where does it hurt?"       Right upper ribs 2. RADIATION: "Does the pain go anywhere else?" (e.g., into neck, jaw, arms, back)     no 3. ONSET: "When did the chest pain begin?" (Minutes, hours or days)      Sat am  4. PATTERN "Does the pain come and go, or has it been constant since it started?"  "Does it get worse with exertion?"  Comes and goes  5. DURATION: "How long does it last" (e.g., seconds, minutes, hours)     30 seconds 6. SEVERITY: "How bad is the pain?"  (e.g., Scale 1-10; mild, moderate, or severe)    - MILD (1-3): doesn't interfere with normal activities     - MODERATE (4-7): interferes with normal activities or awakens from sleep    - SEVERE (8-10): excruciating pain, unable to do any normal activities       3-4  7. CARDIAC RISK FACTORS: "Do you have any history of heart problems or risk factors for heart disease?" (e.g., prior heart attack, angina; high blood pressure, diabetes, being overweight, high cholesterol, smoking, or strong family history of heart disease) no 8. PULMONARY RISK FACTORS: "Do you have any history of lung disease?"  (e.g., blood clots in lung, asthma, emphysema, birth control pills)     no 9. CAUSE: "What do you think is causing the chest pain?"     fall 10. OTHER SYMPTOMS: "Do you have any other symptoms?" (e.g., dizziness, nausea, vomiting, sweating, fever, difficulty breathing, cough)       none 11. PREGNANCY: "Is there any chance you are  pregnant?" "When was your last menstrual period?"       n/a  Answer Assessment - Initial Assessment Questions 1. MECHANISM: "How did the injury happen?" (Suspect child abuse if the history is inconsistent with the child's age or the type of injury.)     S/p fall 2. WHEN: "When did the injury happen?" (Minutes or hours ago)     Friday 3. LOCATION: "Where on the chest is the injury located?"     Right upper ribs 4. APPEARANCE: "What does the injury look like?"     no 5. BLEEDING: "Is there any bleeding now?" If so, ask: "Is it difficult to stop?"     no 6. SEVERITY: "Any difficulty with breathing?"     no 7. SIZE: For bruises or swelling, ask: "How large is it?" (inches or centimeters)     n/a 8. PAIN: "Is there pain?" If so, ask: "How bad is the pain?"     4-5 sharp and burning sensation 9. TETANUS: For any breaks in the skin, ask: "When was the last tetanus booster?"     n/a  Answer Assessment - Initial Assessment Questions 1. MECHANISM: "How did the injury happen?"     Fall 2. ONSET: "When did the injury happen?" (Minutes or hours ago)     Friday 3. LOCATION: "Where on the  chest is the injury located?"     Right upper ribs 4. APPEARANCE: "What does the injury look like?"     No cuts bruises abrasions  5. BLEEDING: "Is there any bleeding now? If so, ask: How long has it been bleeding?"     no 6. SEVERITY: "Any difficulty with breathing?"     no 7. SIZE: For cuts, bruises, or swelling, ask: "How large is it?" (e.g., inches or centimeters)     n/a 8. PAIN: "Is there pain?" If so, ask: "How bad is the pain?"   (e.g., Scale 1-10; or mild, moderate, severe)     3-4  9. TETANUS: For any breaks in the skin, ask: "When was the last tetanus booster?"     n/a 10. PREGNANCY: "Is there any chance you are pregnant?" "When was your last menstrual period?"       n/a  Protocols used: CHEST INJURY-P-AH, CHEST INJURY-A-AH, CHEST PAIN-A-AH

## 2017-12-04 ENCOUNTER — Ambulatory Visit: Payer: BLUE CROSS/BLUE SHIELD | Admitting: Family Medicine

## 2017-12-04 ENCOUNTER — Ambulatory Visit (INDEPENDENT_AMBULATORY_CARE_PROVIDER_SITE_OTHER): Payer: BLUE CROSS/BLUE SHIELD

## 2017-12-04 ENCOUNTER — Encounter: Payer: Self-pay | Admitting: Family Medicine

## 2017-12-04 ENCOUNTER — Other Ambulatory Visit: Payer: Self-pay

## 2017-12-04 VITALS — BP 146/80 | HR 102 | Temp 98.3°F | Resp 18 | Ht 69.0 in | Wt 147.8 lb

## 2017-12-04 DIAGNOSIS — S299XXA Unspecified injury of thorax, initial encounter: Secondary | ICD-10-CM

## 2017-12-04 DIAGNOSIS — R0781 Pleurodynia: Secondary | ICD-10-CM | POA: Diagnosis not present

## 2017-12-04 DIAGNOSIS — S2232XA Fracture of one rib, left side, initial encounter for closed fracture: Secondary | ICD-10-CM | POA: Diagnosis not present

## 2017-12-04 MED ORDER — HYDROCODONE-ACETAMINOPHEN 5-325 MG PO TABS: 1.0000 | ORAL_TABLET | ORAL | 0 refills | Status: DC | PRN

## 2017-12-04 NOTE — Progress Notes (Signed)
Subjective:  By signing my name below, I, David Keith, attest that this documentation has been prepared under the direction and in the presence of David Sorenson, MD. Electronically Signed: Stann Keith, Scribe. 12/04/2017 , 4:23 PM .  Patient was seen in Room 3 .   Patient ID: David Keith, male    DOB: 1955/11/23, 62 y.o.   MRN: [id number] Chief Complaint  Patient presents with  . Fall    pt states he fell on 11/30/2017. Pt states he was walking outside and tripped over a drain hose and fell on his right side. Pt states his right side rib is hurting. Pt states there is no bruising.   HPI David Keith is a 62 y.o. male who presents to Primary Care at [facility] complaining of left sided flank pain (located left lateral anterior chest) after fall 4 that occurred 4 days ago. Patient states he tripped over a garden hose and landed on his left side. He reports having some pain when taking a deep breath initially but this has improved. He's been taking ibuprofen 1 tablet every 4-6 hours for the past 4 days. He takes Meloxicam for his right knee pain.   Past Medical History:  Diagnosis Date  . Chronic back pain    radiculopathy and stenosis  . Hypertension    takes AMlodipine daily  . Joint swelling    right knee  . Weakness    numbness in both legs when walking   Past Surgical History:  Procedure Laterality Date  . COLONOSCOPY    . KNEE ARTHROSCOPY Right 2011   Prior to Admission medications   Medication Sig Start Date End Date Taking? Authorizing Provider  amLODipine (NORVASC) 10 MG tablet TAKE 1 TABLET BY MOUTH EVERY DAY. 09/13/17   Massie Maroon, FNP  diclofenac sodium (VOLTAREN) 1 % GEL Apply 2 g topically 4 (four) times daily. 01/30/17   Massie Maroon, FNP  ibuprofen (ADVIL,MOTRIN) 200 MG tablet Take 200 mg by mouth every 6 (six) hours as needed.    [provider]  meloxicam (MOBIC) 7.5 MG tablet Take 1 tablet (7.5 mg total) by mouth daily. 09/13/17    Massie Maroon, FNP  Multiple Vitamin (MULTIVITAMIN WITH MINERALS) TABS tablet Take 1 tablet by mouth daily. Reported on 02/08/2016    [provider]   Allergies  Allergen Reactions  . Flexeril [Cyclobenzaprine] Other (See Comments)    Gives him weird thoughts  . Other     NO BLOOD PRODUCTS. (pt will accept Albumin)   Family History  Problem Relation Age of Onset  . Cancer Mother   . Heart disease Father   . Diabetes Brother    Social History   Socioeconomic History  . Marital status: Married    Spouse name: None  . Number of children: None  . Years of education: None  . Highest education level: None  Social Needs  . Financial resource strain: None  . Food insecurity - worry: None  . Food insecurity - inability: None  . Transportation needs - medical: None  . Transportation needs - non-medical: None  Occupational History  . None  Tobacco Use  . Smoking status: Current Every Day Smoker    Packs/day: 0.50    Years: 20.00    Pack years: 10.00    Types: Cigarettes  . Smokeless tobacco: Never Used  . Tobacco comment: 3 packs a week  Substance and Sexual Activity  . Alcohol use: Yes  Alcohol/week: 1.2 oz    Types: 2 Cans of beer per week    Comment: daily  . Drug use: No  . Sexual activity: None  Other Topics Concern  . None  Social History Narrative  . None   [facility] 12/04/2017 08/03/2017 01/30/2017 03/10/2016 02/08/2016  Decreased Interest 0 0 0 0 0  Down, Depressed, Hopeless 0 0 0 0 0  PHQ - 2 Score 0 0 0 0 0    Review of Systems  Constitutional: Negative for fatigue and unexpected weight change.  Eyes: Negative for visual disturbance.  Respiratory: Negative for cough, chest tightness and shortness of breath.   Cardiovascular: Negative for chest pain, palpitations and leg swelling.  Gastrointestinal: Negative for abdominal pain and blood in stool.  Musculoskeletal: Positive for myalgias (left sided rib pain).  Neurological:  Negative for dizziness, light-headedness and headaches.       Objective:   Physical Exam  Constitutional: He is oriented to person, place, and time. He appears well-developed and well-nourished. No distress.  HENT:  Head: Normocephalic and atraumatic.  Eyes: EOM are normal. Pupils are equal, round, and reactive to light.  Neck: Neck supple.  Cardiovascular: Normal rate, regular rhythm and normal heart sounds.  No murmur heard. Pulmonary/Chest: Effort normal and breath sounds normal. No respiratory distress. He has no wheezes.  Chest wall: point tenderness about 2 inches lateral to the left nipple  Musculoskeletal: Normal range of motion.  Neurological: He is alert and oriented to person, place, and time.  Skin: Skin is warm and dry.  Psychiatric: He has a normal mood and affect. His behavior is normal.  Nursing note and vitals reviewed.   BP (!) 146/80 (BP Location: Right Arm, Patient Position: Sitting, Cuff Size: Normal)   Pulse (!) 102   Temp 98.3 F (36.8 C) (Oral)   Resp 18   Ht 5\' 9"  (1.753 m)   Wt 147 lb 12.8 oz (67 kg)   SpO2 96%   BMI 21.83 kg/m  Dg Ribs Unilateral W/chest Left  Result Date: 12/04/2017 CLINICAL DATA:  Fall 4 days ago with left-sided chest pain, initial encounter EXAM: LEFT RIBS AND CHEST - 3+ VIEW COMPARISON:  None. FINDINGS: Cardiac shadow is mildly enlarged. Left basilar atelectasis and small effusion are seen. Minimally displaced left seventh rib fracture is noted. No pneumothorax is seen. IMPRESSION: Left seventh rib fracture with associated atelectasis and effusion. No pneumothorax is noted. Electronically Signed   By: Alcide CleverMark  Lukens M.D.   On: 12/04/2017 16:38       Assessment & Plan:   1. Chest wall injury, initial encounter   2. Rib pain on left side   3. Closed fracture of one rib of left side, initial encounter - pain control, pulmonary toilet to avoid atelectasis or secondary infection. No lifiting, gentle movement.     Orders Placed This  Encounter  Procedures  . DG Ribs Unilateral W/Chest Left    Standing Status:   Future    Number of Occurrences:   1    Standing Expiration Date:   12/04/2018    Order Specific Question:   Reason for Exam (SYMPTOM  OR DIAGNOSIS REQUIRED)    Answer:   fell 4d prior - severe point tenderness 2 in lateral to left nipple    Order Specific Question:   Preferred imaging location?    Answer:   External    Meds ordered this encounter  Medications  . HYDROcodone-acetaminophen (NORCO/VICODIN) 5-325 MG tablet  Sig: Take 1-2 tablets by mouth every 4 (four) hours as needed for moderate pain.    Dispense:  40 tablet    Refill:  0    I personally performed the services described in this documentation, which was scribed in my presence. The recorded information has been reviewed and considered, and addended by me as needed.   David Keith, M.D.  Primary Care at [facility] [address] [place], [place] [address] [phone] phone [phone] fax  12/07/17 2:55 PM

## 2017-12-04 NOTE — Patient Instructions (Addendum)
Do not use meloxicam with any other otc pain medication other than tylenol/acetaminophen - so no aleve, ibuprofen, motrin, advil, etc.     IF you received an x-ray today, you will receive an invoice from [facility]. Please contact [facility] at [phone] with questions or concerns regarding your invoice.   IF you received labwork today, you will receive an invoice from [facility]. Please contact [facility] at [phone] with questions or concerns regarding your invoice.   Our billing staff will not be able to assist you with questions regarding bills from these companies.  You will be contacted with the lab results as soon as they are available. The fastest way to get your results is to activate your My Chart account. Instructions are located on the last page of this paperwork. If you have not heard from us regarding the results in 2 weeks, please contact this office.     Rib Fracture A rib fracture is a break or crack in one of the bones of the ribs. The ribs are a group of long, curved bones that wrap around your chest and attach to your spine. They protect your lungs and other organs in the chest cavity. A broken or cracked rib is often painful, but most do not cause other problems. Most rib fractures heal on their own over time. However, rib fractures can be more serious if multiple ribs are broken or if broken ribs move out of place and push against other structures. What are the causes?  A direct blow to the chest. For example, this could happen during contact sports, a car accident, or a fall against a hard object.  Repetitive movements with high force, such as pitching a baseball or having severe coughing spells. What are the signs or symptoms?  Pain when you breathe in or cough.  Pain when someone presses on the injured area. How is this diagnosed? Your caregiver will perform a physical exam. Various imaging tests may be ordered to confirm the diagnosis and  to look for related injuries. These tests may include a chest X-ray, computed tomography (CT), magnetic resonance imaging (MRI), or a bone scan. How is this treated? Rib fractures usually heal on their own in 1-3 months. The longer healing period is often associated with a continued cough or other aggravating activities. During the healing period, pain control is very important. Medication is usually given to control pain. Hospitalization or surgery may be needed for more severe injuries, such as those in which multiple ribs are broken or the ribs have moved out of place. Follow these instructions at home:  Avoid strenuous activity and any activities or movements that cause pain. Be careful during activities and avoid bumping the injured rib.  Gradually increase activity as directed by your caregiver.  Only take over-the-counter or prescription medications as directed by your caregiver. Do not take other medications without asking your caregiver first.  Apply ice to the injured area for the first 1-2 days after you have been treated or as directed by your caregiver. Applying ice helps to reduce inflammation and pain. ? Put ice in a plastic bag. ? Place a towel between your skin and the bag. ? Leave the ice on for 15-20 minutes at a time, every 2 hours while you are awake.  Perform deep breathing as directed by your caregiver. This will help prevent pneumonia, which is a common complication of a broken rib. Your caregiver may instruct you to: ? Take deep breaths several times a  day. ? Try to cough several times a day, holding a pillow against the injured area. ? Use a device called an incentive spirometer to practice deep breathing several times a day.  Drink enough fluids to keep your urine clear or pale yellow. This will help you avoid constipation.  Do not wear a rib belt or binder. These restrict breathing, which can lead to pneumonia. Get help right away if:  You have a fever.  You  have difficulty breathing or shortness of breath.  You develop a continual cough, or you cough up thick or bloody sputum.  You feel sick to your stomach (nausea), throw up (vomit), or have abdominal pain.  You have worsening pain not controlled with medications. This information is not intended to replace advice given to you by your health care provider. Make sure you discuss any questions you have with your health care provider. Document Released: 09/11/2005 Document Revised: 02/17/2016 Document Reviewed: 11/13/2012 Elsevier Interactive Patient Education  [employer].

## 2017-12-25 ENCOUNTER — Telehealth: Payer: Self-pay

## 2017-12-25 DIAGNOSIS — M25561 Pain in right knee: Principal | ICD-10-CM

## 2017-12-25 DIAGNOSIS — G8929 Other chronic pain: Secondary | ICD-10-CM

## 2017-12-25 DIAGNOSIS — I10 Essential (primary) hypertension: Secondary | ICD-10-CM

## 2017-12-25 MED ORDER — MELOXICAM 7.5 MG PO TABS: 7.5000 mg | ORAL_TABLET | Freq: Every day | ORAL | 2 refills | Status: DC

## 2017-12-25 MED ORDER — AMLODIPINE BESYLATE 10 MG PO TABS: ORAL_TABLET | ORAL | 5 refills | Status: DC

## 2017-12-25 NOTE — Telephone Encounter (Signed)
Medications sent into pharmacy. Thanks.

## 2018-01-31 ENCOUNTER — Ambulatory Visit: Payer: No Typology Code available for payment source | Admitting: Family Medicine

## 2018-02-03 ENCOUNTER — Emergency Department (HOSPITAL_COMMUNITY): Payer: Medicare Other

## 2018-02-03 ENCOUNTER — Encounter (HOSPITAL_COMMUNITY): Payer: Self-pay | Admitting: *Deleted

## 2018-02-03 ENCOUNTER — Emergency Department (HOSPITAL_COMMUNITY)
Admission: EM | Admit: 2018-02-03 | Discharge: 2018-02-03 | Disposition: A | Discharge status: Home / Self Care | Payer: Medicare Other | Attending: Emergency Medicine | Admitting: Emergency Medicine

## 2018-02-03 ENCOUNTER — Other Ambulatory Visit: Payer: Self-pay

## 2018-02-03 DIAGNOSIS — I10 Essential (primary) hypertension: Secondary | ICD-10-CM | POA: Diagnosis not present

## 2018-02-03 DIAGNOSIS — G8929 Other chronic pain: Secondary | ICD-10-CM | POA: Insufficient documentation

## 2018-02-03 DIAGNOSIS — Z79899 Other long term (current) drug therapy: Secondary | ICD-10-CM | POA: Insufficient documentation

## 2018-02-03 DIAGNOSIS — F10920 Alcohol use, unspecified with intoxication, uncomplicated: Secondary | ICD-10-CM

## 2018-02-03 DIAGNOSIS — M25552 Pain in left hip: Secondary | ICD-10-CM | POA: Diagnosis not present

## 2018-02-03 DIAGNOSIS — M549 Dorsalgia, unspecified: Secondary | ICD-10-CM | POA: Diagnosis not present

## 2018-02-03 DIAGNOSIS — F17210 Nicotine dependence, cigarettes, uncomplicated: Secondary | ICD-10-CM | POA: Insufficient documentation

## 2018-02-03 DIAGNOSIS — R531 Weakness: Secondary | ICD-10-CM | POA: Diagnosis not present

## 2018-02-03 DIAGNOSIS — S79912A Unspecified injury of left hip, initial encounter: Secondary | ICD-10-CM | POA: Diagnosis not present

## 2018-02-03 DIAGNOSIS — S2232XD Fracture of one rib, left side, subsequent encounter for fracture with routine healing: Secondary | ICD-10-CM | POA: Diagnosis not present

## 2018-02-03 DIAGNOSIS — X58XXXA Exposure to other specified factors, initial encounter: Secondary | ICD-10-CM | POA: Insufficient documentation

## 2018-02-03 DIAGNOSIS — M791 Myalgia, unspecified site: Secondary | ICD-10-CM

## 2018-02-03 LAB — CBC WITH DIFFERENTIAL/PLATELET
BASOS ABS: 0 10*3/uL (ref 0.0–0.1)
Basophils Relative: 0 %
Eosinophils Absolute: 0.1 10*3/uL (ref 0.0–0.7)
Eosinophils Relative: 2 %
HEMATOCRIT: 37.5 % — AB (ref 39.0–52.0)
HEMOGLOBIN: 12.9 g/dL — AB (ref 13.0–17.0)
LYMPHS PCT: 29 %
Lymphs Abs: 1.4 10*3/uL (ref 0.7–4.0)
MCH: 30.4 pg (ref 26.0–34.0)
MCHC: 34.4 g/dL (ref 30.0–36.0)
MCV: 88.4 fL (ref 78.0–100.0)
MONO ABS: 0.5 10*3/uL (ref 0.1–1.0)
MONOS PCT: 10 %
NEUTROS ABS: 2.9 10*3/uL (ref 1.7–7.7)
NEUTROS PCT: 59 %
Platelets: 208 10*3/uL (ref 150–400)
RBC: 4.24 MIL/uL (ref 4.22–5.81)
RDW: 15.1 % (ref 11.5–15.5)
WBC: 5 10*3/uL (ref 4.0–10.5)

## 2018-02-03 LAB — URINALYSIS, ROUTINE W REFLEX MICROSCOPIC
Bilirubin Urine: NEGATIVE
Glucose, UA: NEGATIVE mg/dL
Hgb urine dipstick: NEGATIVE
Ketones, ur: NEGATIVE mg/dL
LEUKOCYTES UA: NEGATIVE
Nitrite: NEGATIVE
Protein, ur: NEGATIVE mg/dL
SPECIFIC GRAVITY, URINE: 1.012 (ref 1.005–1.030)
pH: 5 (ref 5.0–8.0)

## 2018-02-03 LAB — COMPREHENSIVE METABOLIC PANEL
ALBUMIN: 3.9 g/dL (ref 3.5–5.0)
ALT: 30 U/L (ref 17–63)
ANION GAP: 11 (ref 5–15)
AST: 38 U/L (ref 15–41)
Alkaline Phosphatase: 68 U/L (ref 38–126)
BUN: 14 mg/dL (ref 6–20)
CHLORIDE: 112 mmol/L — AB (ref 101–111)
CO2: 20 mmol/L — ABNORMAL LOW (ref 22–32)
Calcium: 8.8 mg/dL — ABNORMAL LOW (ref 8.9–10.3)
Creatinine, Ser: 0.8 mg/dL (ref 0.61–1.24)
GFR calc Af Amer: >60 mL/min (ref >60)
GFR calc non Af Amer: >60 mL/min (ref >60)
GLUCOSE: 93 mg/dL (ref 65–99)
POTASSIUM: 3.9 mmol/L (ref 3.5–5.1)
Sodium: 143 mmol/L (ref 135–145)
Total Bilirubin: 0.5 mg/dL (ref 0.3–1.2)
Total Protein: 6.6 g/dL (ref 6.5–8.1)

## 2018-02-03 LAB — I-STAT TROPONIN, ED
Troponin i, poc: 0 ng/mL (ref 0.00–0.08)
Troponin i, poc: 0.01 ng/mL (ref 0.00–0.08)

## 2018-02-03 LAB — ETHANOL: Alcohol, Ethyl (B): 108 mg/dL — ABNORMAL HIGH (ref <10)

## 2018-02-03 MED ORDER — KETOROLAC TROMETHAMINE 30 MG/ML IJ SOLN
30.0000 mg | Freq: Once | INTRAMUSCULAR | Status: AC
  Administered 2018-02-03: 30 mg via INTRAVENOUS
  Filled 2018-02-03: qty 1

## 2018-02-03 MED ORDER — SODIUM CHLORIDE 0.9 % IV BOLUS
1000.0000 mL | Freq: Once | INTRAVENOUS | Status: AC
  Administered 2018-02-03: 1000 mL via INTRAVENOUS

## 2018-02-03 NOTE — ED Notes (Signed)
Patient transported to X-ray 

## 2018-02-03 NOTE — ED Notes (Signed)
Gave pt a urinal 

## 2018-02-03 NOTE — ED Triage Notes (Signed)
The pt is c/o  Weakness since he fell 4 weeks ago  [facility]  And was told he has a single fractured rib  Lt hip pain. He has been drinking alcohol and smoking  Pot  He reports that he also has rt leg pain

## 2018-02-03 NOTE — ED Notes (Signed)
Attempted to ambulate pt. Unsteady gait. PA made aware.

## 2018-02-03 NOTE — ED Provider Notes (Signed)
[facility] EMERGENCY DEPARTMENT Provider Note   CSN: [id number] Arrival date & time: 02/03/18  0157     History   Chief Complaint Chief Complaint  Patient presents with  . Generalized Body Aches    HPI David Keith is a 62 y.o. male with a past medical history of hypertension, chronic back pain, osteoarthritis of the right knee, who presents to ED for evaluation of generalized weakness, left hip pain for the past 4 days.  Patient states that he had a fall about 4 weeks ago.  He was seen at a primary care clinic and was diagnosed with 1 fractured rib on the left side.  Patient is a poor historian and is unable to tell me if his hip pain began after the fall or began recently.  He has been taking his daily meloxicam and amlodipine as well as oxycodone as needed.  He states "I just do not feel good, something is off."  However he denies any chest pain, abdominal pain, nausea, vomiting, changes in appetite, headache, vision changes, additional injuries or falls, shortness of breath, URI symptoms, fever.  Patient reports daily alcohol, marijuana and tobacco use.  Regarding alcohol use, he states "one beer and some booze."  HPI  Past Medical History:  Diagnosis Date  . Chronic back pain    radiculopathy and stenosis  . Hypertension    takes AMlodipine daily  . Joint swelling    right knee  . Weakness    numbness in both legs when walking    Patient Active Problem List   Diagnosis Date Noted  . Hepatitis C 08/23/2017  . Tobacco dependence 03/10/2016  . Knee swelling 02/09/2016  . Right knee pain 02/09/2016  . Spondylolisthesis of lumbar region 03/10/2015  . Inj musc/tend the rotator cuff of left shoulder, init 07/29/2014  . Hypertension 01/21/2012  . Smoking 01/04/2012    Past Surgical History:  Procedure Laterality Date  . COLONOSCOPY    . KNEE ARTHROSCOPY Right 2011        Home Medications    Prior to Admission medications   Medication Sig  Start Date End Date Taking? Authorizing Provider  amLODipine (NORVASC) 10 MG tablet TAKE 1 TABLET BY MOUTH EVERY DAY. Patient taking differently: Take 10 mg by mouth daily.  12/25/17  Yes Massie Maroon, FNP  diclofenac sodium (VOLTAREN) 1 % GEL Apply 2 g topically 4 (four) times daily. 01/30/17  Yes Massie Maroon, FNP  HYDROcodone-acetaminophen (NORCO/VICODIN) 5-325 MG tablet Take 1-2 tablets by mouth every 4 (four) hours as needed for moderate pain. 12/04/17  Yes Sherren Mocha, MD  ibuprofen (ADVIL,MOTRIN) 200 MG tablet Take 400 mg by mouth every 6 (six) hours as needed for moderate pain.   Yes [provider]  meloxicam (MOBIC) 7.5 MG tablet Take 1 tablet (7.5 mg total) by mouth daily. 12/25/17  Yes Massie Maroon, FNP    Family History Family History  Problem Relation Age of Onset  . Cancer Mother   . Heart disease Father   . Diabetes Brother     Social History Social History   Tobacco Use  . Smoking status: Current Every Day Smoker    Packs/day: 0.50    Years: 20.00    Pack years: 10.00    Types: Cigarettes  . Smokeless tobacco: Never Used  . Tobacco comment: 3 packs a week  Substance Use Topics  . Alcohol use: Yes    Alcohol/week: 1.2 oz  Types: 2 Cans of beer per week    Comment: daily  . Drug use: Yes    Types: Marijuana     Allergies   Flexeril [cyclobenzaprine] and Other   Review of Systems Review of Systems  Constitutional: Positive for fatigue. Negative for appetite change, chills and fever.  HENT: Negative for ear pain, rhinorrhea, sneezing and sore throat.   Eyes: Negative for photophobia and visual disturbance.  Respiratory: Negative for cough, chest tightness, shortness of breath and wheezing.   Cardiovascular: Negative for chest pain and palpitations.  Gastrointestinal: Negative for abdominal pain, blood in stool, constipation, diarrhea, nausea and vomiting.  Genitourinary: Negative for dysuria, hematuria and urgency.  Musculoskeletal:  Positive for arthralgias and myalgias.  Skin: Negative for rash.  Neurological: Negative for dizziness, weakness and light-headedness.     Physical Exam Updated Vital Signs BP 130/87   Pulse 73   Temp 97.7 F (36.5 C) (Oral)   Resp 15   Ht  (1.753 m)   Wt 66.2 kg (146 lb)   SpO2 93%   BMI 21.56 kg/m   Physical Exam  Constitutional: He is oriented to person, place, and time. He appears well-developed and well-nourished. No distress.  NAD. Slurring words.  HENT:  Head: Normocephalic and atraumatic.  Nose: Nose normal.  Eyes: Pupils are equal, round, and reactive to light. Conjunctivae and EOM are normal. Right eye exhibits no discharge. Left eye exhibits no discharge. No scleral icterus.  Neck: Normal range of motion. Neck supple.  Cardiovascular: Normal rate, regular rhythm, normal heart sounds and intact distal pulses. Exam reveals no gallop and no friction rub.  No murmur heard. Pulmonary/Chest: Effort normal and breath sounds normal. No respiratory distress.  Abdominal: Soft. Bowel sounds are normal. He exhibits no distension. There is no tenderness. There is no guarding.  Musculoskeletal: Normal range of motion. He exhibits edema and tenderness.       Back:  Mild bilateral pedal edema. No midline spinal tenderness present in lumbar, thoracic or cervical spine. No step-off palpated. No visible bruising, edema or temperature change noted. No objective signs of numbness present. No saddle anesthesia.   Neurological: He is alert and oriented to person, place, and time. No cranial nerve deficit or sensory deficit. He exhibits normal muscle tone. Coordination normal.  Pupils reactive. No facial asymmetry noted. Cranial nerves appear grossly intact. Sensation intact to light touch on face, BUE and BLE. Strength 5/5 in BUE and BLE.  Skin: Skin is warm and dry. No rash noted.  Psychiatric: He has a normal mood and affect.  Nursing note and vitals reviewed.    ED Treatments /  Results  Labs (all labs ordered are listed, but only abnormal results are displayed) Labs Reviewed  COMPREHENSIVE METABOLIC PANEL - Abnormal; Notable for the following components:      Result Value   Chloride 112 (*)    CO2 20 (*)    Calcium 8.8 (*)    All other components within normal limits  ETHANOL - Abnormal; Notable for the following components:   Alcohol, Ethyl (B) 108 (*)    All other components within normal limits  CBC WITH DIFFERENTIAL/PLATELET - Abnormal; Notable for the following components:   Hemoglobin 12.9 (*)    HCT 37.5 (*)    All other components within normal limits  URINALYSIS, ROUTINE W REFLEX MICROSCOPIC  I-STAT TROPONIN, ED  I-STAT TROPONIN, ED    EKG EKG Interpretation  Date/Time:  Sunday Feb 03 2018 02:10:22 EDT Ventricular  Rate:  89 PR Interval:  158 QRS Duration: 90 QT Interval:  378 QTC Calculation: 459 R Axis:   55 Text Interpretation:  Normal sinus rhythm Nonspecific ST and T wave abnormality Abnormal ECG No significant change since last tracing Confirmed by Jacalyn Lefevre [phone]) on 02/03/2018 8:32:04 AM   Radiology Dg Ribs Unilateral W/chest Left  Result Date: 02/03/2018 CLINICAL DATA:  Weakness status post fall 4 weeks ago. Patient had a prior diagnosis a rib fracture. EXAM: LEFT RIBS AND CHEST - 3+ VIEW COMPARISON:  December 04 2017 FINDINGS: There is healing fracture of the left lateral seventh rib. No new rib fracture is noted. There is no evidence of pneumothorax or pleural effusion. Both lungs are clear. Heart size and mediastinal contours are within normal limits. IMPRESSION: Healing fracture of the lateral left seventh rib. Electronically Signed   By: Sherian Rein M.D.   On: 02/03/2018 07:55   Dg Hip Unilat W Or Wo Pelvis 2-3 Views Left  Result Date: 02/03/2018 CLINICAL DATA:  Status post fall 4 weeks ago with left hip pain. EXAM: DG HIP (WITH OR WITHOUT PELVIS) 2-3V LEFT COMPARISON:  None. FINDINGS: There is no evidence of hip fracture  or dislocation. Patient status post prior surgery of the lumbar spine. IMPRESSION: No acute fracture or dislocation. Electronically Signed   By: Sherian Rein M.D.   On: 02/03/2018 07:56    Procedures Procedures (including critical care time)  Medications Ordered in ED Medications  sodium chloride 0.9 % bolus 1,000 mL (0 mLs Intravenous Stopped 02/03/18 0848)  ketorolac (TORADOL) 30 MG/ML injection 30 mg (30 mg Intravenous Given 02/03/18 6578)     Initial Impression / Assessment and Plan / ED Course  I have reviewed the triage vital signs and the nursing notes.  Pertinent labs & imaging results that were available during my care of the patient were reviewed by me and considered in my medical decision making (see chart for details).     Patient with a past medical history of hypertension, chronic back pain, presents to ED for generalized weakness, left hip pain.  Patient is a poor historian so unsure if the symptoms began after his fall that occurred 4 weeks ago or recently in the past week.  He denies any additional injuries or falls after the incident.  Patient was diagnosed with a single left-sided rib fracture when the fall happened and has been taking his pain medication as needed.  He was seen and evaluated at the primary care office after this occurred.  He denies any abdominal pain, chest pain, vomiting, changes in appetite, headache, vision changes or fever.  He reports daily alcohol, marijuana and tobacco use.  On physical exam patient is overall well-appearing.  He has no deficits on his neurological exam noted.  He is alert and oriented x4.  Lab work significant for alcohol level of 108.  EKG reviewed with no significant changes since previous.  Repeat chest x-ray shows a well-healing left rib fracture.  Left hip fracture is unremarkable.  Patient's vital signs are within normal limits. Initial and delta troponin both negative. Patient apparently slightly unsteady while walking, but  could be due to alcohol intoxication. Doubt emergent cause of symptoms, could be due to his daily alcohol use. Encouraged to f/u with PCP and to continue home medications as previously prescribed.  Advised to return to ED for any severe worsening symptoms.  Portions of this note were generated with Scientist, clinical (histocompatibility and immunogenetics). Dictation errors may occur despite best  attempts at proofreading.   Final Clinical Impressions(s) / ED Diagnoses   Final diagnoses:  Alcoholic intoxication without complication (HCC)  Myalgia  Closed fracture of one rib of left side with routine healing, subsequent encounter    ED Discharge Orders    None       Dietrich Pates, PA-C 02/03/18 1104    Jacalyn Lefevre, MD 02/03/18 1429

## 2018-02-03 NOTE — Discharge Instructions (Signed)
Your lab work, x-ray of your ribs and chest and x-ray of your left hip were all normal.  Your rib fracture is healing well. Continue taking the medications prescribed by your primary care provider.

## 2018-02-04 ENCOUNTER — Other Ambulatory Visit: Payer: Self-pay

## 2018-02-04 ENCOUNTER — Emergency Department (HOSPITAL_COMMUNITY): Payer: Medicare Other

## 2018-02-04 ENCOUNTER — Observation Stay (HOSPITAL_COMMUNITY)
Admission: EM | Admit: 2018-02-04 | Discharge: 2018-02-07 | Disposition: A | Discharge status: Home / Self Care | Payer: Medicare Other | Attending: Family Medicine | Admitting: Family Medicine

## 2018-02-04 DIAGNOSIS — B1920 Unspecified viral hepatitis C without hepatic coma: Secondary | ICD-10-CM | POA: Diagnosis present

## 2018-02-04 DIAGNOSIS — D62 Acute posthemorrhagic anemia: Secondary | ICD-10-CM

## 2018-02-04 DIAGNOSIS — Z79899 Other long term (current) drug therapy: Secondary | ICD-10-CM | POA: Insufficient documentation

## 2018-02-04 DIAGNOSIS — E876 Hypokalemia: Secondary | ICD-10-CM

## 2018-02-04 DIAGNOSIS — R531 Weakness: Principal | ICD-10-CM

## 2018-02-04 DIAGNOSIS — G8929 Other chronic pain: Secondary | ICD-10-CM

## 2018-02-04 DIAGNOSIS — R52 Pain, unspecified: Secondary | ICD-10-CM

## 2018-02-04 DIAGNOSIS — Z720 Tobacco use: Secondary | ICD-10-CM

## 2018-02-04 DIAGNOSIS — F17210 Nicotine dependence, cigarettes, uncomplicated: Secondary | ICD-10-CM | POA: Insufficient documentation

## 2018-02-04 DIAGNOSIS — I10 Essential (primary) hypertension: Secondary | ICD-10-CM | POA: Diagnosis present

## 2018-02-04 DIAGNOSIS — R270 Ataxia, unspecified: Secondary | ICD-10-CM

## 2018-02-04 DIAGNOSIS — M549 Dorsalgia, unspecified: Secondary | ICD-10-CM

## 2018-02-04 DIAGNOSIS — R260 Ataxic gait: Secondary | ICD-10-CM | POA: Diagnosis present

## 2018-02-04 DIAGNOSIS — R29898 Other symptoms and signs involving the musculoskeletal system: Secondary | ICD-10-CM

## 2018-02-04 DIAGNOSIS — F1010 Alcohol abuse, uncomplicated: Secondary | ICD-10-CM

## 2018-02-04 LAB — DIFFERENTIAL
BASOS ABS: 0 10*3/uL (ref 0.0–0.1)
Basophils Relative: 0 %
EOS ABS: 0 10*3/uL (ref 0.0–0.7)
Eosinophils Relative: 1 %
Lymphocytes Relative: 21 %
Lymphs Abs: 1.1 10*3/uL (ref 0.7–4.0)
Monocytes Absolute: 0.6 10*3/uL (ref 0.1–1.0)
Monocytes Relative: 12 %
NEUTROS ABS: 3.7 10*3/uL (ref 1.7–7.7)
Neutrophils Relative %: 66 %

## 2018-02-04 LAB — I-STAT CHEM 8, ED
BUN: 15 mg/dL (ref 6–20)
CHLORIDE: 105 mmol/L (ref 101–111)
Calcium, Ion: 1.16 mmol/L (ref 1.15–1.40)
Creatinine, Ser: 0.8 mg/dL (ref 0.61–1.24)
GLUCOSE: 104 mg/dL — AB (ref 65–99)
HEMATOCRIT: 44 % (ref 39.0–52.0)
HEMOGLOBIN: 15 g/dL (ref 13.0–17.0)
POTASSIUM: 3.9 mmol/L (ref 3.5–5.1)
Sodium: 142 mmol/L (ref 135–145)
TCO2: 24 mmol/L (ref 22–32)

## 2018-02-04 LAB — PROTIME-INR
INR: 0.94
PROTHROMBIN TIME: 12.5 s (ref 11.4–15.2)

## 2018-02-04 LAB — I-STAT TROPONIN, ED: TROPONIN I, POC: 0.01 ng/mL (ref 0.00–0.08)

## 2018-02-04 LAB — CBC
HCT: 37.8 % — ABNORMAL LOW (ref 39.0–52.0)
Hemoglobin: 13 g/dL (ref 13.0–17.0)
MCH: 30.6 pg (ref 26.0–34.0)
MCHC: 34.4 g/dL (ref 30.0–36.0)
MCV: 88.9 fL (ref 78.0–100.0)
PLATELETS: 204 10*3/uL (ref 150–400)
RBC: 4.25 MIL/uL (ref 4.22–5.81)
RDW: 14.9 % (ref 11.5–15.5)
WBC: 5.4 10*3/uL (ref 4.0–10.5)

## 2018-02-04 LAB — COMPREHENSIVE METABOLIC PANEL
ALT: 31 U/L (ref 17–63)
AST: 41 U/L (ref 15–41)
Albumin: 4.1 g/dL (ref 3.5–5.0)
Alkaline Phosphatase: 68 U/L (ref 38–126)
Anion gap: 9 (ref 5–15)
BUN: 14 mg/dL (ref 6–20)
CO2: 27 mmol/L (ref 22–32)
CREATININE: 0.87 mg/dL (ref 0.61–1.24)
Calcium: 9.3 mg/dL (ref 8.9–10.3)
Chloride: 105 mmol/L (ref 101–111)
GFR calc non Af Amer: >60 mL/min (ref >60)
Glucose, Bld: 105 mg/dL — ABNORMAL HIGH (ref 65–99)
POTASSIUM: 3.9 mmol/L (ref 3.5–5.1)
SODIUM: 141 mmol/L (ref 135–145)
Total Bilirubin: 0.5 mg/dL (ref 0.3–1.2)
Total Protein: 6.8 g/dL (ref 6.5–8.1)

## 2018-02-04 LAB — APTT: APTT: 27 s (ref 24–36)

## 2018-02-04 NOTE — ED Triage Notes (Signed)
Patient was seen here Sunday (yesterday) for left arm and leg "just quit". When he got home it was worse, using crutches. States still unable to use left arm and leg and this has been ongoing x2 weeks.

## 2018-02-05 ENCOUNTER — Observation Stay (HOSPITAL_COMMUNITY): Payer: Medicare Other

## 2018-02-05 ENCOUNTER — Encounter (HOSPITAL_COMMUNITY): Payer: Self-pay | Admitting: Internal Medicine

## 2018-02-05 DIAGNOSIS — I10 Essential (primary) hypertension: Secondary | ICD-10-CM | POA: Diagnosis not present

## 2018-02-05 DIAGNOSIS — R531 Weakness: Secondary | ICD-10-CM | POA: Diagnosis not present

## 2018-02-05 DIAGNOSIS — R270 Ataxia, unspecified: Secondary | ICD-10-CM | POA: Diagnosis not present

## 2018-02-05 DIAGNOSIS — M4802 Spinal stenosis, cervical region: Secondary | ICD-10-CM | POA: Diagnosis not present

## 2018-02-05 DIAGNOSIS — R29898 Other symptoms and signs involving the musculoskeletal system: Secondary | ICD-10-CM | POA: Insufficient documentation

## 2018-02-05 DIAGNOSIS — M1711 Unilateral primary osteoarthritis, right knee: Secondary | ICD-10-CM | POA: Diagnosis not present

## 2018-02-05 DIAGNOSIS — I639 Cerebral infarction, unspecified: Secondary | ICD-10-CM | POA: Diagnosis not present

## 2018-02-05 DIAGNOSIS — M50223 Other cervical disc displacement at C6-C7 level: Secondary | ICD-10-CM | POA: Diagnosis not present

## 2018-02-05 LAB — CBC
HEMATOCRIT: 39.1 % (ref 39.0–52.0)
HEMOGLOBIN: 13.2 g/dL (ref 13.0–17.0)
MCH: 29.9 pg (ref 26.0–34.0)
MCHC: 33.8 g/dL (ref 30.0–36.0)
MCV: 88.5 fL (ref 78.0–100.0)
Platelets: 171 10*3/uL (ref 150–400)
RBC: 4.42 MIL/uL (ref 4.22–5.81)
RDW: 15.3 % (ref 11.5–15.5)
WBC: 3.8 10*3/uL — ABNORMAL LOW (ref 4.0–10.5)

## 2018-02-05 LAB — CREATININE, SERUM
Creatinine, Ser: 0.79 mg/dL (ref 0.61–1.24)
GFR calc Af Amer: >60 mL/min (ref >60)
GFR calc non Af Amer: >60 mL/min (ref >60)

## 2018-02-05 LAB — LIPID PANEL
CHOL/HDL RATIO: 2.4 ratio
CHOLESTEROL: 163 mg/dL (ref 0–200)
HDL: 68 mg/dL (ref >40)
LDL CALC: 79 mg/dL (ref 0–99)
TRIGLYCERIDES: 81 mg/dL (ref <150)
VLDL: 16 mg/dL (ref 0–40)

## 2018-02-05 LAB — VITAMIN B12: Vitamin B-12: 406 pg/mL (ref 180–914)

## 2018-02-05 LAB — ETHANOL: Alcohol, Ethyl (B): <10 mg/dL (ref <10)

## 2018-02-05 LAB — TSH: TSH: 1.714 u[IU]/mL (ref 0.350–4.500)

## 2018-02-05 LAB — HEMOGLOBIN A1C
Hgb A1c MFr Bld: 5.1 % (ref 4.8–5.6)
Mean Plasma Glucose: 99.67 mg/dL

## 2018-02-05 LAB — URIC ACID: URIC ACID, SERUM: 5 mg/dL (ref 4.4–7.6)

## 2018-02-05 MED ORDER — IOPAMIDOL (ISOVUE-370) INJECTION 76%
INTRAVENOUS | Status: AC
  Filled 2018-02-05: qty 50

## 2018-02-05 MED ORDER — ACETAMINOPHEN 160 MG/5ML PO SOLN: 650.0000 mg | ORAL | Status: DC | PRN

## 2018-02-05 MED ORDER — CLOPIDOGREL BISULFATE 75 MG PO TABS
75.0000 mg | ORAL_TABLET | Freq: Every day | ORAL | Status: DC
  Filled 2018-02-05 (×2): qty 1

## 2018-02-05 MED ORDER — VITAMIN B-1 100 MG PO TABS
100.0000 mg | ORAL_TABLET | Freq: Every day | ORAL | Status: DC
  Administered 2018-02-05 – 2018-02-07 (×3): 100 mg via ORAL
  Filled 2018-02-05 (×3): qty 1

## 2018-02-05 MED ORDER — CLOPIDOGREL BISULFATE 75 MG PO TABS
300.0000 mg | ORAL_TABLET | Freq: Once | ORAL | Status: AC
  Administered 2018-02-05: 300 mg via ORAL

## 2018-02-05 MED ORDER — ADULT MULTIVITAMIN W/MINERALS CH
1.0000 | ORAL_TABLET | Freq: Every day | ORAL | Status: DC
  Administered 2018-02-05 – 2018-02-07 (×3): 1 via ORAL
  Filled 2018-02-05 (×3): qty 1

## 2018-02-05 MED ORDER — THIAMINE HCL 100 MG/ML IJ SOLN
Freq: Once | INTRAVENOUS | Status: AC
  Administered 2018-02-05: 06:00:00 via INTRAVENOUS
  Filled 2018-02-05: qty 1000

## 2018-02-05 MED ORDER — LORAZEPAM 1 MG PO TABS
0.0000 mg | ORAL_TABLET | Freq: Four times a day (QID) | ORAL | Status: AC
  Administered 2018-02-05: 1 mg via ORAL
  Administered 2018-02-06: 2 mg via ORAL
  Filled 2018-02-05 (×2): qty 1
  Filled 2018-02-05: qty 2

## 2018-02-05 MED ORDER — STROKE: EARLY STAGES OF RECOVERY BOOK
Freq: Once | Status: DC
  Filled 2018-02-05 (×2): qty 1

## 2018-02-05 MED ORDER — ASPIRIN 325 MG PO TABS
325.0000 mg | ORAL_TABLET | Freq: Every day | ORAL | Status: DC
  Administered 2018-02-05: 325 mg via ORAL
  Filled 2018-02-05: qty 1

## 2018-02-05 MED ORDER — ENOXAPARIN SODIUM 40 MG/0.4ML ~~LOC~~ SOLN
40.0000 mg | Freq: Every day | SUBCUTANEOUS | Status: DC
  Administered 2018-02-05 – 2018-02-07 (×3): 40 mg via SUBCUTANEOUS
  Filled 2018-02-05 (×3): qty 0.4

## 2018-02-05 MED ORDER — LORAZEPAM 1 MG PO TABS: 0.0000 mg | ORAL_TABLET | Freq: Two times a day (BID) | ORAL | Status: DC

## 2018-02-05 MED ORDER — IOPAMIDOL (ISOVUE-370) INJECTION 76%
50.0000 mL | Freq: Once | INTRAVENOUS | Status: AC | PRN
Start: 2018-02-05 — End: 2018-02-05
  Administered 2018-02-05: 50 mL via INTRAVENOUS

## 2018-02-05 MED ORDER — ASPIRIN 300 MG RE SUPP: 300.0000 mg | Freq: Every day | RECTAL | Status: DC

## 2018-02-05 MED ORDER — LORAZEPAM 2 MG/ML IJ SOLN: 1.0000 mg | Freq: Four times a day (QID) | INTRAMUSCULAR | Status: DC | PRN

## 2018-02-05 MED ORDER — THIAMINE HCL 100 MG/ML IJ SOLN: 100.0000 mg | Freq: Every day | INTRAMUSCULAR | Status: DC

## 2018-02-05 MED ORDER — FOLIC ACID 1 MG PO TABS
1.0000 mg | ORAL_TABLET | Freq: Every day | ORAL | Status: DC
  Administered 2018-02-05 – 2018-02-07 (×3): 1 mg via ORAL
  Filled 2018-02-05 (×3): qty 1

## 2018-02-05 MED ORDER — AMLODIPINE BESYLATE 10 MG PO TABS
10.0000 mg | ORAL_TABLET | Freq: Every day | ORAL | Status: DC
  Administered 2018-02-06 – 2018-02-07 (×2): 10 mg via ORAL
  Filled 2018-02-05 (×2): qty 1

## 2018-02-05 MED ORDER — HYDRALAZINE HCL 20 MG/ML IJ SOLN: 10.0000 mg | INTRAMUSCULAR | Status: DC | PRN

## 2018-02-05 MED ORDER — GADOBENATE DIMEGLUMINE 529 MG/ML IV SOLN
15.0000 mL | Freq: Once | INTRAVENOUS | Status: AC
  Administered 2018-02-05: 14 mL via INTRAVENOUS

## 2018-02-05 MED ORDER — ACETAMINOPHEN 650 MG RE SUPP: 650.0000 mg | RECTAL | Status: DC | PRN

## 2018-02-05 MED ORDER — ACETAMINOPHEN 325 MG PO TABS: 650.0000 mg | ORAL_TABLET | ORAL | Status: DC | PRN

## 2018-02-05 MED ORDER — SODIUM CHLORIDE 0.9 % IV SOLN
INTRAVENOUS | Status: DC
  Administered 2018-02-05 – 2018-02-06 (×2): via INTRAVENOUS

## 2018-02-05 MED ORDER — LORAZEPAM 1 MG PO TABS
1.0000 mg | ORAL_TABLET | Freq: Four times a day (QID) | ORAL | Status: DC | PRN
  Administered 2018-02-06: 1 mg via ORAL

## 2018-02-05 NOTE — Evaluation (Signed)
Physical Therapy Evaluation Patient Details Name: David Keith MRN: [id number] DOB: 25-Jul-1956 Today's Date: 02/05/2018   History of Present Illness  62 year old male who presented with difficulty walking and left upper extremity weakness.  He does have the significant past medical history for hypertension, tobacco abuse and alcohol abuse.  Patient reported a 3-day history of left sided weakness with worsening ambulatory dysfunction to the point where he is not able to ambulate.  The weakness has been associated with paresthesias on the left side.  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  Clinical Impression  Patient presents with decreased independence with mobility due to L>R weakness, decreased sensation, decreased balance and incoordination.  He will benefit from skilled PT in the acute setting to allow return home with family assist following CIR level rehab stay.  Patient was independent with mobility with crutches including entering third floor apartment previously.  Currently needs mod to max A for in room ambulation.  PT to follow.     Follow Up Recommendations CIR    Equipment Recommendations  Rolling walker with 5" wheels    Recommendations for Other Services Rehab consult     Precautions / Restrictions Precautions Precautions: Fall      Mobility  Bed Mobility Overal bed mobility: Needs Assistance Bed Mobility: Supine to Sit;Sit to Supine     Supine to sit: HOB elevated;Min guard Sit to supine: Min assist;HOB elevated   General bed mobility comments: cues for scooting up to [facility]  Transfers Overall transfer level: Needs assistance Equipment used: Rolling walker (2 wheeled) Transfers: Sit to/from Stand Sit to Stand: Max assist         General transfer comment: lifting and lowering help  from EOB, initially falling back on bed lifting with both hands on walker, cues for hand placement, and more assist to prevent knee buckling to achieve standing  Ambulation/Gait Ambulation/Gait assistance: Mod assist Ambulation Distance (Feet): 14 Feet Assistive device: Rolling walker (2 wheeled) Gait Pattern/deviations: Step-to pattern;Step-through pattern;Decreased stride length;Wide base of support;Narrow base of support;Shuffle;Staggering left     General Gait Details: staggering, L LE buckling, assist to turn walker, assist for safety and balance  Stairs            Wheelchair Mobility    Modified Rankin (Stroke Patients Only)       Balance Overall balance assessment: Needs assistance   Sitting balance-Leahy Scale: Good     Standing balance support: Bilateral upper extremity supported Standing balance-Leahy Scale: Poor Standing balance comment: assist with UE support                             Pertinent Vitals/Pain Pain Assessment: No/denies pain    Home Living Family/patient expects to be discharged to:: Private residence Living Arrangements: Spouse/significant other Available Help at Discharge: Family;Available 24 hours/day Type of Home: Apartment Home Access: Stairs to enter Entrance Stairs-Rails: Doctor, general practice of Steps: two and half flights Home Layout: One level Home Equipment: Crutches      Prior Function Level of Independence: Independent with assistive device(s)         Comments: using crutches     Hand Dominance   Dominant Hand: Right    Extremity/Trunk Assessment   Upper Extremity Assessment Upper Extremity Assessment: LUE deficits/detail;RUE deficits/detail RUE Deficits / Details: decreased grip,  but not as bad as L, able to demonstrate fingers to thumb, strength elbow flexion 4-/5, extension 3+/5, shoulder flex 4/5 RUE Sensation: decreased light touch RUE Coordination: decreased fine motor LUE  Deficits / Details: decreased grip, L worse than RL, unable to demonstrate fingers to thumb, strength elbow flexion 3/5, extension 3-/5, shoulder flex 4-/5 LUE Sensation: decreased light touch(worse than R) LUE Coordination: decreased gross motor;decreased fine motor    Lower Extremity Assessment Lower Extremity Assessment: RLE deficits/detail;LLE deficits/detail RLE Deficits / Details: AROM WFL, strength hip flex 4-/5, knee exten 4/5, ankle DF 4-/5 RLE Sensation: decreased light touch;history of peripheral neuropathy LLE Deficits / Details: AROM WFL, strength hip flex 3+.5, knee ext 4/5, ankle DF 4/5 LLE Sensation: decreased light touch;history of peripheral neuropathy       Communication   Communication: No difficulties  Cognition Arousal/Alertness: Lethargic Behavior During Therapy: WFL for tasks assessed/performed Overall Cognitive Status: Within Functional Limits for tasks assessed(but not formally assessed)                                        General Comments General comments (skin integrity, edema, etc.): patient initially asleep and difficult to rouse, assist to awaken and maintain alertness for eval.  RN reports got Ativan recently.  Wife in the room throughout    Exercises     Assessment/Plan    PT Assessment Patient needs continued PT services  PT Problem List Decreased strength;Decreased mobility;Decreased balance;Decreased knowledge of use of DME;Impaired sensation;Decreased coordination;Decreased knowledge of precautions;Decreased safety awareness       PT Treatment Interventions DME instruction;Therapeutic activities;Gait training;Therapeutic exercise;Patient/family education;Stair training;Balance training;Functional mobility training    PT Goals (Current goals can be found in the Care Plan section)  Acute Rehab PT Goals Patient Stated Goal: to get better PT Goal Formulation: With patient Time For Goal Achievement: 02/19/18 Potential to  Achieve Goals: Good    Frequency Min 3X/week   Barriers to discharge Inaccessible home environment      Co-evaluation               AM-PAC PT "6 Clicks" Daily Activity  Outcome Measure Difficulty turning over in bed (including adjusting bedclothes, sheets and blankets)?: A Little Difficulty moving from lying on back to sitting on the side of the bed? : A Lot Difficulty sitting down on and standing up from a chair with arms (e.g., wheelchair, bedside commode, etc,.)?: Unable Help needed moving to and from a bed to chair (including a wheelchair)?: A Lot Help needed walking in hospital room?: A Lot Help needed climbing 3-5 steps with a railing? : Total 6 Click Score: 11    End of Session Equipment Utilized During Treatment: Gait belt Activity Tolerance: Patient tolerated treatment well Patient left: in bed;with call bell/phone within reach;with family/visitor present   PT Visit Diagnosis: Unsteadiness on feet (R26.81);Other abnormalities of gait and mobility (R26.89);Other symptoms and signs involving the nervous system (R29.898);History of falling (Z91.81)    Time: 1610-9604 PT Time Calculation (min) (ACUTE ONLY): 22 min   Charges:   PT Evaluation $PT Eval Moderate Complexity: 1 Mod     PT G CodesSheran Lawless, Singer [phone] 02/05/2018   Elray Mcgregor 02/05/2018, 3:51 PM

## 2018-02-05 NOTE — ED Provider Notes (Signed)
[facility] EMERGENCY DEPARTMENT Provider Note   CSN: [id number] Arrival date & time: 02/04/18  1939     History   Chief Complaint Chief Complaint  Patient presents with  . Extremity Weakness    HPI KALIX MEINECKE is a 62 y.o. male.  HPI  This is a 62 year old male with a history of hypertension, hepatitis B, smoking who presents with left-sided weakness and a disturbance.  Patient reports onset of symptoms on Sunday.  He states that he went to a niece's birthday party.  He had 2 beers and 2 shots.  He states that after that "everything just went wrong."  He states he had difficulty walking and left-sided weakness.  He was seen and evaluated on Sunday here.  Patient states that he was evaluated for this and his left hip pain.  He states "I could not walk then and I do not know why they let me go."  He states since being discharged he has been trying to get around with crutches.  He reports that he does have hip pain but this has been going on for some time.  He denies any speech disturbances or difficulty swallowing.  He denies chest pain, shortness of breath, abdominal pain, nausea, vomiting, headaches.  He reports that he has not had anything to drink since Sunday.  Chart reviewed.  Patient was triaged on Sunday.  Triage note reports pain related to fall.  No mention of weakness.  I have reviewed provider notes.  Normal neuro exam as documented.  There is a nursing note that it discharge patient was unsteady.  No follow-up note.    Past Medical History:  Diagnosis Date  . Chronic back pain    radiculopathy and stenosis  . Hypertension    takes AMlodipine daily  . Joint swelling    right knee  . Weakness    numbness in both legs when walking    Patient Active Problem List   Diagnosis Date Noted  . Hepatitis C 08/23/2017  . Tobacco dependence 03/10/2016  . Knee swelling 02/09/2016  . Right knee pain 02/09/2016  . Spondylolisthesis of lumbar region  03/10/2015  . Inj musc/tend the rotator cuff of left shoulder, init 07/29/2014  . Hypertension 01/21/2012  . Smoking 01/04/2012    Past Surgical History:  Procedure Laterality Date  . COLONOSCOPY    . KNEE ARTHROSCOPY Right 2011        Home Medications    Prior to Admission medications   Medication Sig Start Date End Date Taking? Authorizing Provider  amLODipine (NORVASC) 10 MG tablet TAKE 1 TABLET BY MOUTH EVERY DAY. Patient taking differently: Take 10 mg by mouth daily.  12/25/17   Massie Maroon, FNP  diclofenac sodium (VOLTAREN) 1 % GEL Apply 2 g topically 4 (four) times daily. 01/30/17   Massie Maroon, FNP  HYDROcodone-acetaminophen (NORCO/VICODIN) 5-325 MG tablet Take 1-2 tablets by mouth every 4 (four) hours as needed for moderate pain. 12/04/17   Sherren Mocha, MD  ibuprofen (ADVIL,MOTRIN) 200 MG tablet Take 400 mg by mouth every 6 (six) hours as needed for moderate pain.    [provider]  meloxicam (MOBIC) 7.5 MG tablet Take 1 tablet (7.5 mg total) by mouth daily. 12/25/17   Massie Maroon, FNP    Family History Family History  Problem Relation Age of Onset  . Cancer Mother   . Heart disease Father   . Diabetes Brother     Social History  Social History   Tobacco Use  . Smoking status: Current Every Day Smoker    Packs/day: 0.50    Years: 20.00    Pack years: 10.00    Types: Cigarettes  . Smokeless tobacco: Never Used  . Tobacco comment: 3 packs a week  Substance Use Topics  . Alcohol use: Yes    Alcohol/week: 1.2 oz    Types: 2 Cans of beer per week    Comment: daily  . Drug use: Yes    Types: Marijuana     Allergies   Flexeril [cyclobenzaprine] and Other   Review of Systems Review of Systems  Constitutional: Negative for fever.  Respiratory: Negative for shortness of breath.   Cardiovascular: Negative for chest pain.  Gastrointestinal: Negative for abdominal pain.  Genitourinary: Negative for dysuria.  Musculoskeletal: Positive  for gait problem. Negative for back pain and neck pain.  Neurological: Positive for weakness. Negative for facial asymmetry, numbness and headaches.  All other systems reviewed and are negative.    Physical Exam Updated Vital Signs BP (!) 168/96   Pulse 65   Temp 97.7 F (36.5 C)   Resp 16   Ht  (1.753 m)   Wt 66.2 kg (146 lb)   SpO2 100%   BMI 21.56 kg/m   Physical Exam  Constitutional: He is oriented to person, place, and time. No distress.  HENT:  Head: Normocephalic and atraumatic.  Eyes: Pupils are equal, round, and reactive to light. EOM are normal.  Neck: Neck supple.  Cardiovascular: Normal rate, regular rhythm and normal heart sounds.  No murmur heard. Pulmonary/Chest: Effort normal and breath sounds normal. No respiratory distress. He has no wheezes.  Abdominal: Soft. Bowel sounds are normal. There is no tenderness. There is no rebound.  Musculoskeletal: He exhibits edema.  Left greater than right lower extremity edema  Lymphadenopathy:    He has no cervical adenopathy.  Neurological: He is alert and oriented to person, place, and time.  Cranial nerves II through XII grossly intact, 4+/5 grip, biceps, triceps, deltoid strength on the left, 5 out of 5 strength on the right upper extremity, patient postures left wrist in a flexed position, dysmetria with finger-nose-finger on the left, grossly symmetric motor strength bilateral lower extremities, patient is very ataxic on gait  Skin: Skin is warm and dry.  Psychiatric: He has a normal mood and affect.  Nursing note and vitals reviewed.    ED Treatments / Results  Labs (all labs ordered are listed, but only abnormal results are displayed) Labs Reviewed  CBC - Abnormal; Notable for the following components:      Result Value   HCT 37.8 (*)    All other components within normal limits  COMPREHENSIVE METABOLIC PANEL - Abnormal; Notable for the following components:   Glucose, Bld 105 (*)    All other  components within normal limits  I-STAT CHEM 8, ED - Abnormal; Notable for the following components:   Glucose, Bld 104 (*)    All other components within normal limits  PROTIME-INR  APTT  DIFFERENTIAL  ETHANOL  I-STAT TROPONIN, ED  CBG MONITORING, ED    EKG EKG Interpretation  Date/Time:  Monday Feb 04 2018 19:47:55 EDT Ventricular Rate:  79 PR Interval:  146 QRS Duration: 86 QT Interval:  390 QTC Calculation: 447 R Axis:   56 Text Interpretation:  Normal sinus rhythm Minimal voltage criteria for LVH, may be normal variant Borderline ECG Confirmed by Ross Marcus ([id number]) on 02/05/2018 4:41:14  AM   Radiology Dg Ribs Unilateral W/chest Left  Result Date: 02/03/2018 CLINICAL DATA:  Weakness status post fall 4 weeks ago. Patient had a prior diagnosis a rib fracture. EXAM: LEFT RIBS AND CHEST - 3+ VIEW COMPARISON:  December 04 2017 FINDINGS: There is healing fracture of the left lateral seventh rib. No new rib fracture is noted. There is no evidence of pneumothorax or pleural effusion. Both lungs are clear. Heart size and mediastinal contours are within normal limits. IMPRESSION: Healing fracture of the lateral left seventh rib. Electronically Signed   By: Sherian Rein M.D.   On: 02/03/2018 07:55   Ct Head Wo Contrast  Result Date: 02/04/2018 CLINICAL DATA:  Left-sided weakness EXAM: CT HEAD WITHOUT CONTRAST TECHNIQUE: Contiguous axial images were obtained from the base of the skull through the vertex without intravenous contrast. COMPARISON:  None. FINDINGS: Brain: No mass lesion, intraparenchymal hemorrhage or extra-axial collection. No evidence of acute cortical infarct. Normal appearance of the brain parenchyma and extra axial spaces for age. Vascular: No hyperdense vessel or unexpected vascular calcification. Skull: Normal visualized skull base, calvarium and extracranial soft tissues. Sinuses/Orbits: Mineralized area within the left maxillary sinus, without thickening of the  maxillary sinus walls. No sinus mucosal abnormality. Normal orbits. IMPRESSION: 1. Normal brain. 2. 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 Electronically Signed   By: Deatra Robinson M.D.   On: 02/04/2018 21:01   Dg Hip Unilat W Or Wo Pelvis 2-3 Views Left  Result Date: 02/03/2018 CLINICAL DATA:  Status post fall 4 weeks ago with left hip pain. EXAM: DG HIP (WITH OR WITHOUT PELVIS) 2-3V LEFT COMPARISON:  None. FINDINGS: There is no evidence of hip fracture or dislocation. Patient status post prior surgery of the lumbar spine. IMPRESSION: No acute fracture or dislocation. Electronically Signed   By: Sherian Rein M.D.   On: 02/03/2018 07:56    Procedures Procedures (including critical care time)  Medications Ordered in ED Medications  sodium chloride 0.9 % 1,000 mL with thiamine 100 mg, folic acid 1 mg, multivitamins adult 10 mL infusion (has no administration in time range)     Initial Impression / Assessment and Plan / ED Course  I have reviewed the triage vital signs and the nursing notes.  Pertinent labs & imaging results that were available during my care of the patient were reviewed by me and considered in my medical decision making (see chart for details).     Patient presents with concerns for gait disturbance and left arm weakness.  Neuro exam as above.  Reports symptoms since Sunday.  He is out of the window for TPA.  He does have some neurologic deficits concerning for stroke.  No significant confusion.  However, given history of drinking, Wernicke's is also a consideration.  Lab work reviewed and largely reassuring.  Alcohol level added.  CT scan reviewed and negative.  MRI ordered.  Neurology consulted.  Discussed with [name] who will evaluate the patient.  Will admit for stroke work-up.  Patient will likely need physical and occupational therapy as well.  Final Clinical  Impressions(s) / ED Diagnoses   Final diagnoses:  Left arm weakness  Ataxia    ED Discharge Orders    None       Shon Baton, MD 02/05/18 [phone]

## 2018-02-05 NOTE — H&P (Signed)
History and Physical    David Keith ZOX:[id number] DOB: 1956/07/03 DOA: 02/04/2018  PCP: Massie Maroon, FNP  Patient coming from: Home.  Chief Complaint: Difficulty walking and left upper extremity weakness.  HPI: David Keith is a 62 y.o. male with history of hypertension, tobacco abuse, alcohol abuse presents to the ER because of persistent weakness of the left upper extremity and difficulty walking.  Patient symptoms started 3 days ago.  At the time patient also had left hip pain.  Patient came to the ER and x-rays of the hip did not show anything acute and was discharged home.  Patient continued to have weakness of the left upper extremity and more difficulty in walking due to ataxia.  Denies any difficulty swallowing speaking or any weakness in the right side.  Patient admits to drinking alcohol every day.  ED Course: In the ER on exam patient has ataxic gait and needs help to walk.  Patient also has dysdiadochokinesia of the left upper extremity and strength of the left approximate he is 4 x 5.  Distal extremities are 5 x 5.  There is mild swelling of the right knee which patient states has been ongoing for last few weeks.  There is also swelling of the both lower extremities.  Review of Systems: As per HPI, rest all negative.   Past Medical History:  Diagnosis Date  . Chronic back pain    radiculopathy and stenosis  . Hypertension    takes AMlodipine daily  . Joint swelling    right knee  . Weakness    numbness in both legs when walking    Past Surgical History:  Procedure Laterality Date  . COLONOSCOPY    . KNEE ARTHROSCOPY Right 2011     reports that he has been smoking cigarettes.  He has a 10.00 pack-year smoking history. He has never used smokeless tobacco. He reports that he drinks about 1.2 oz of alcohol per week. He reports that he has current or past drug history. Drug: Marijuana.  Allergies  Allergen Reactions  . Flexeril [Cyclobenzaprine] Other  (See Comments)    Gives him weird thoughts  . Other     NO BLOOD PRODUCTS. (pt will accept Albumin)    Family History  Problem Relation Age of Onset  . Cancer Mother   . Heart disease Father   . Diabetes Brother     Prior to Admission medications   Medication Sig Start Date End Date Taking? Authorizing Provider  amLODipine (NORVASC) 10 MG tablet TAKE 1 TABLET BY MOUTH EVERY DAY. Patient taking differently: Take 10 mg by mouth daily.  12/25/17  Yes Massie Maroon, FNP  diclofenac sodium (VOLTAREN) 1 % GEL Apply 2 g topically 4 (four) times daily. Patient taking differently: Apply 2 g topically 4 (four) times daily as needed (pain).  01/30/17  Yes Massie Maroon, FNP  HYDROcodone-acetaminophen (NORCO/VICODIN) 5-325 MG tablet Take 1-2 tablets by mouth every 4 (four) hours as needed for moderate pain. 12/04/17  Yes Sherren Mocha, MD  ibuprofen (ADVIL,MOTRIN) 200 MG tablet Take 400 mg by mouth every 6 (six) hours as needed for moderate pain.   Yes [provider]  meloxicam (MOBIC) 7.5 MG tablet Take 1 tablet (7.5 mg total) by mouth daily. 12/25/17  Yes Massie Maroon, FNP    Physical Exam: Vitals:   02/04/18 2246 02/05/18 0105 02/05/18 0502 02/05/18 0754  BP: (!) 149/101 (!) 156/95 (!) 168/96 (!) 141/95  Pulse: 69  67 65 62  Resp: Temp: 98.3 F (36.8 C) 98.4 F (36.9 C) 97.7 F (36.5 C)   TempSrc:  Oral    SpO2: 100% 99% 100% 96%  Weight:      Height:          Constitutional: Moderately built and nourished. Vitals:   02/04/18 2246 02/05/18 0105 02/05/18 0502 02/05/18 0754  BP: (!) 149/101 (!) 156/95 (!) 168/96 (!) 141/95  Pulse: 69 67 65 62  Resp: Temp: 98.3 F (36.8 C) 98.4 F (36.9 C) 97.7 F (36.5 C)   TempSrc:  Oral    SpO2: 100% 99% 100% 96%  Weight:      Height:       Eyes: Anicteric no pallor. ENMT: No discharge from the ears eyes nose or mouth. Neck: No neck rigidity no mass felt. Respiratory: No rhonchi or  crepitations. Cardiovascular: S1-S2 heard no murmurs appreciated. Abdomen: Soft nontender bowel sounds present. Musculoskeletal: Mild edema of the both lower extremities.  Right knee swelling. Skin: No rash. Neurologic: Alert awake oriented to time place and person.  Has left upper extremity weakness 4 x 5.  Has left-sided dysdiadochokinesia.  Ataxic on walking.  Right upper extremity and right lower extremity left lower activity of 5 x 5 in strength.  No facial asymmetry.  Tongue is midline pupils are equal and reacting to light. Psychiatric: Appears normal with normal affect.     Labs on Admission: I have personally reviewed following labs and imaging studies  CBC: Recent Labs  Lab 02/03/18 0700 02/04/18 2016 02/04/18 2026  WBC 5.0 5.4  --   NEUTROABS 2.9 3.7  --   HGB 12.9* 13.0 15.0  HCT 37.5* 37.8* 44.0  MCV 88.4 88.9  --   PLT 208 204  --    Basic Metabolic Panel: Recent Labs  Lab 02/03/18 0700 02/04/18 2016 02/04/18 2026  NA 143 141 142  K 3.9 3.9 3.9  CL 112* 105 105  CO2 20* 27  --   GLUCOSE 93 105* 104*  BUN CREATININE 0.80 0.87 0.80  CALCIUM 8.8* 9.3  --    GFR: Estimated Creatinine Clearance: 88.5 mL/min (by C-G formula based on SCr of 0.8 mg/dL). Liver Function Tests: Recent Labs  Lab 02/03/18 0700 02/04/18 2016  AST 38 41  ALT 30 31  ALKPHOS 68 68  BILITOT 0.5 0.5  PROT 6.6 6.8  ALBUMIN 3.9 4.1   No results for input(s): LIPASE, AMYLASE in the last 168 hours. No results for input(s): AMMONIA in the last 168 hours. Coagulation Profile: Recent Labs  Lab 02/04/18 2016  INR 0.94   Cardiac Enzymes: No results for input(s): CKTOTAL, CKMB, CKMBINDEX, TROPONINI in the last 168 hours. BNP (last 3 results) No results for input(s): PROBNP in the last 8760 hours. HbA1C: No results for input(s): HGBA1C in the last 72 hours. CBG: No results for input(s): GLUCAP in the last 168 hours. Lipid Profile: No results for input(s): CHOL, HDL,  LDLCALC, TRIG, CHOLHDL, LDLDIRECT in the last 72 hours. Thyroid Function Tests: No results for input(s): TSH, T4TOTAL, FREET4, T3FREE, THYROIDAB in the last 72 hours. Anemia Panel: No results for input(s): VITAMINB12, FOLATE, FERRITIN, TIBC, IRON, RETICCTPCT in the last 72 hours. Urine analysis:    Component Value Date/Time   COLORURINE YELLOW 02/03/2018 0913   APPEARANCEUR CLEAR 02/03/2018 0913   LABSPEC 1.012 02/03/2018 0913   PHURINE 5.0 02/03/2018 0913  GLUCOSEU NEGATIVE 02/03/2018 0913   HGBUR NEGATIVE 02/03/2018 0913   BILIRUBINUR NEGATIVE 02/03/2018 0913   BILIRUBINUR neg 01/04/2012 0906   KETONESUR NEGATIVE 02/03/2018 0913   PROTEINUR NEGATIVE 02/03/2018 0913   UROBILINOGEN 0.2 08/03/2017 1133   NITRITE NEGATIVE 02/03/2018 0913   LEUKOCYTESUR NEGATIVE 02/03/2018 0913   Sepsis Labs: (procalcitonin:4,lacticidven:4) )No results found for this or any previous visit (from the past 240 hour(s)).   Radiological Exams on Admission: Ct Head Wo Contrast  Result Date: 02/04/2018 CLINICAL DATA:  Left-sided weakness EXAM: CT HEAD WITHOUT CONTRAST TECHNIQUE: Contiguous axial images were obtained from the base of the skull through the vertex without intravenous contrast. COMPARISON:  None. FINDINGS: Brain: No mass lesion, intraparenchymal hemorrhage or extra-axial collection. No evidence of acute cortical infarct. Normal appearance of the brain parenchyma and extra axial spaces for age. Vascular: No hyperdense vessel or unexpected vascular calcification. Skull: Normal visualized skull base, calvarium and extracranial soft tissues. Sinuses/Orbits: Mineralized area within the left maxillary sinus, without thickening of the maxillary sinus walls. No sinus mucosal abnormality. Normal orbits. IMPRESSION: 1. Normal brain. 2. 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 Electronically Signed   By:  Deatra Robinson M.D.   On: 02/04/2018 21:01   Mr Brain Wo Contrast  Result Date: 02/05/2018 CLINICAL DATA:  Left-sided weakness EXAM: MRI HEAD WITHOUT CONTRAST TECHNIQUE: Multiplanar, multiecho pulse sequences of the brain and surrounding structures were obtained without intravenous contrast. COMPARISON:  Head CT from yesterday FINDINGS: Brain: No acute infarction, hemorrhage, hydrocephalus, extra-axial collection or mass lesion. Mild for age FLAIR hyperintensity in the cerebral white matter Vascular: Major flow voids are preserved Skull and upper cervical spine: Normal marrow signal Sinuses/Orbits: Polypoid density in the inferior left maxillary sinus that is calcified by CT, likely calcified retention cyst. IMPRESSION: Negative for infarct.  No explanation for symptoms. Electronically Signed   By: Marnee Spring M.D.   On: 02/05/2018 07:43    EKG: Independently reviewed.  Normal sinus rhythm.  Assessment/Plan Principal Problem:   Ataxia Active Problems:   Hypertension   Hepatitis C   Weakness    1. Ataxia with left upper extremity weakness concerning for stroke -MRI brain is negative for stroke.  CT angiogram of the head and neck is pending.  I did discuss with neurologist.  Stroke team will be following at this time for further recommendations.  I have also ordered B12 folate TSH thiamine RPR and HIV.  Physical therapy consult.  Check lipid panel hemoglobin A1c 2D echo.  We will also get MRI of the C-spine. 2. Hypertension -we will allow for permissive hypertension.  Patient is on amlodipine.  PRN IV hydralazine for systolic blood pressure more than 220 and diastolic more than 120. 3. Bilateral lower extremity edema -we will check Dopplers. 4. Right knee pain with swelling and left hip pain -x-ray of the right knee is pending recent x-ray of the left hip was not showing anything acute.  Check uric acid levels. 5. Alcohol abuse placed on CIWA protocol. 6. Tobacco abuse advised to quit  smoking.  Chest x-ray pending. 7. Recent x-ray rib showing possible fractures of the left seventh rib.  Chest x-ray today is pending. 8. History of hepatitis C per the chart.   DVT prophylaxis: Lovenox. Code Status: Full code. Family Communication: Discussed with patient. Disposition Plan: To be determined. Consults called: Neurologist. Admission status: Observation.   Eduard Clos MD [facility] Pager [phone]-  [phone].  If 7PM-7AM, please contact night-coverage [email or web address] Password [email or web address]  02/05/2018, 8:04 AM

## 2018-02-05 NOTE — Care Management Note (Signed)
Case Management Note  Patient Details  Name: David Keith MRN: [id number] Date of Birth: 1955/10/04  Subjective/Objective:    Pt in to r/o CVA. He is from home and [facility].               Action/Plan: Awaiting PT/OT evals. CM following for d/c needs, physician orders.   Expected Discharge Date:                  Expected Discharge Plan:     In-House Referral:     Discharge planning Services     Post Acute Care Choice:    Choice offered to:     DME Arranged:    DME Agency:     HH Arranged:    HH Agency:     Status of Service:  In process, will continue to follow  If discussed at Long Length of Stay Meetings, dates discussed:    Additional Comments:  Kermit Balo, RN 02/05/2018, 11:52 AM

## 2018-02-05 NOTE — Progress Notes (Signed)
Pt arrived to unit, AOx4 

## 2018-02-05 NOTE — ED Notes (Signed)
CT coming to get patient. Admitting reports pt can eat HH diet once passes swallow evaluation, which he has. Given coffee.

## 2018-02-05 NOTE — Consult Note (Signed)
Neurology Consultation Reason for Consult: Left-sided weakness Referring Physician: Horton, C  CC: Left-sided weakness  History is obtained from: Patient  HPI: David Keith is a 62 y.o. male with a history of hypertension who presents with difficulty walking that started on Saturday.  He went to a party, and on leaving, he was having difficulty walking.  He therefore asked to be brought to the emergency department where he was evaluated and it was felt that his difficulty walking was likely due to hip pain which is a chronic complaint for him.  He had also had a couple drinks that day.  Unfortunately, even if the drinks were all if he continued to be unsteady and have difficulty walking and therefore he presented again today.   LKW: Sunday tpa given?: no, out of window   ROS: A 14 point ROS was performed and is negative except as noted in the HPI.  Past Medical History:  Diagnosis Date  . Chronic back pain    radiculopathy and stenosis  . Hypertension    takes AMlodipine daily  . Joint swelling    right knee  . Weakness    numbness in both legs when walking     Family History  Problem Relation Age of Onset  . Cancer Mother   . Heart disease Father   . Diabetes Brother      Social History:  reports that he has been smoking cigarettes.  He has a 10.00 pack-year smoking history. He has never used smokeless tobacco. He reports that he drinks about 1.2 oz of alcohol per week. He reports that he has current or past drug history. Drug: Marijuana.   Exam: Current vital signs: BP (!) 168/96   Pulse 65   Temp 97.7 F (36.5 C)   Resp 16   Ht  (1.753 m)   Wt 66.2 kg (146 lb)   SpO2 100%   BMI 21.56 kg/m  Vital signs in last 24 hours: Temp:  [97.7 F (36.5 C)-98.5 F (36.9 C)] 97.7 F (36.5 C) (05/14 0502) Pulse Rate:  [65-83] 65 (05/14 0502) Resp:  [16-20] 16 (05/14 0502) BP: (148-168)/(95-103) 168/96 (05/14 0502) SpO2:  [99 %-100 %] 100 % (05/14  0502) Weight:  [66.2 kg (146 lb)] 66.2 kg (146 lb) (05/13 2006)   Physical Exam  Constitutional: Appears well-developed and well-nourished.  Psych: Affect appropriate to situation Eyes: No scleral injection HENT: No OP obstrucion Head: Normocephalic.  Cardiovascular: Normal rate and regular rhythm.  Respiratory: Effort normal, non-labored breathing GI: Soft.  No distension. There is no tenderness.  Skin: WDI  Neuro: Mental Status: Patient is awake, alert, oriented to person, place, month, year, and situation. Patient is able to give a clear and coherent history. No signs of aphasia or neglect  He sounds slightly dysarthric Cranial Nerves: II: Visual Fields are full. Pupils are equal, round, and reactive to light. III,IV, VI: EOMI without ptosis or diploplia.  V: Facial sensation is symmetric to temperature VII: Facial movement is symmetric VIII: hearing is intact to voice X: Uvula elevates symmetrically XI: Shoulder shrug is symmetric. XII: tongue is midline without atrophy or fasciculations.  Motor: Tone is normal. Bulk is normal. 5/5 strength was present on the right, on the left he has 4/5 strength of left arm and leg. Sensory: Sensation is symmetric to light touch and temperature in the arms and legs. Cerebellar: He is ataxic in both the left arm and leg   I have reviewed labs in epic  and the results pertinent to this consultation are: CMP-unremarkable CBC-unremarkable  I have reviewed the images obtained: CT head- no acute findings  Impression: 62 year old male with history of 2 days of left sided weakness and ataxia. Given the sudden onset of unilateral symptoms, I strongly small suspect ischemic infarct in the posterior circulation.   Recommendations: 1. HgbA1c, fasting lipid panel 2. MRI of the brain without contrast 3. Frequent neuro checks 4. Echocardiogram 5. CTA head and neck.  6. Prophylactic therapy-Antiplatelet med: Aspirin - dose  PO or   PR, plavix  load then  daily.  7. Risk factor modification 8. Telemetry monitoring 9. PT consult, OT consult, Speech consult 10. please page stroke NP  Or  PA  Or MD  from 8am -4 pm as this patient will be followed by the stroke team at this point.   You can look them up on [email or web address]      Ritta Slot, MD [facility] [phone]  If 7pm- 7am, please page neurology on call as listed in AMION.

## 2018-02-05 NOTE — Progress Notes (Signed)
Rehab Admissions Coordinator Note:  Patient was screened by Clois Dupes for appropriateness for an Inpatient Acute Rehab Consult per PT recommendation. Awaiting neurosurgery conuslt.  At this time, we are recommending await medical/surgical plan.Clois Dupes 02/05/2018, 4:05 PM  I can be reached at [phone].

## 2018-02-05 NOTE — ED Notes (Signed)
Attempted to give report to nurse; unable to give report due to telephone issues (not able to hear nurse). Nurse stated that she will call me back.

## 2018-02-05 NOTE — Progress Notes (Signed)
Stroke Team Progress Note   HPI ( Dr Amada Jupiter )David Keith is a 62 y.o. male with a history of hypertension who presents with difficulty walking that started on Saturday.  He went to a party, and on leaving, he was having difficulty walking.  He therefore asked to be brought to the emergency department where he was evaluated and it was felt that his difficulty walking was likely due to hip pain which is a chronic complaint for him.  He had also had a couple drinks that day. Unfortunately, even if the drinks were all if he continued to be unsteady and have difficulty walking and therefore he presented again today. LKW: 02/03/2018 tpa given?: no, out of window   SUBJECTIVE  I have reviewed in details patient's history of present illness. She denies any significant neck pain, fall injury or radicular pain. MRI scan of the brain shows no acute infarct. MRI scan of cervical spine shows C3-4 disc protrusion with cord signal abnormality and edema.   OBJECTIVE Most recent Vital Signs: Temp: 97.6 F (36.4 C) (05/14 1204) Temp Source: Oral (05/14 1204) BP: 169/94 (05/14 1204) Pulse Rate: 62 (05/14 1204) Respiratory Rate: 20 O2 Saturdation: 100%  CBG (last 3)  No results for input(s): GLUCAP in the last 72 hours.     Studies:  CT normal brain. 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 Retropharyngeal effusion. ECHO pending LDL 79 mg percent HbA1c 5.1 MRI C Spine : degenerative spinal stenosis at C3-C4 in part due to a moderate size disc herniation in the midline  , resulting in spinal stenosis with cord mass effect and abnormal cord signal. Associated moderate to severe right greater than left C4 foraminal stenosis. Physical Exam:    Pleasant frail middle-aged African-American gentleman currently not in distress. . Afebrile. Head is nontraumatic. Neck is supple without bruit.     Cardiac exam no murmur or gallop. Lungs are clear to auscultation. Distal pulses are well felt. Neurological Exam ;  Awake  Alert oriented x 3. Normal speech and language.eye movements full without nystagmus.fundi were not visualized. Vision acuity and fields appear normal. Hearing is normal. Palatal movements are normal. Face symmetric. Tongue midline.  mild left upper and lower extremity weakness 4/5 with wasting and weakness of her left hand intrinsic muscles. Left grip is weak. Mild weakness of left hip flexors and ankle dorsiflexors. Deep tendon reflexes are asymmetric brisker on the left compared to the right. Coordination is impaired on the left compared to the right. Normal sensation. Gait deferred.  ASSESSMENT [name] is a 62 y.o. 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 No definite evidence of stroke.   Hospital day # 0  TREATMENT/PLAN  I have personally examined this patient, reviewed notes, independently viewed imaging studies, participated in medical decision making and plan of care.ROS completed by me personally and pertinent positives fully documented  I have made any additions or clarifications directly to the above note. Recommend neurosurgery consultation for complete consideration for decompressive cervical spine surgery. No further stroke workup is needed. Long discussion with Dr.  [name] and patient and answered questions. I spent 25  minutes in total face-to-face time with the patient, more than 50% of which was spent in counseling and coordination of care, reviewing test results, reviewing medication and discussing or reviewing the diagnosis of compressive cervical myelopathy   ,  the prognosis and treatment options.   Delia Heady, MD Medical Director [facility] Pager: [phone] 02/05/2018 2:41 PM

## 2018-02-05 NOTE — ED Notes (Signed)
Called 3[facility] given update, will transport to 3[facility] when he returns from CT.

## 2018-02-05 NOTE — Progress Notes (Signed)
SLP Cancellation Note  Patient Details Name: David Keith MRN: [id number] DOB: 1956-03-04   Cancelled treatment:       Reason Eval/Treat Not Completed: SLP screened, no needs identified, will sign off   [name] 02/05/2018, 12:49 PM

## 2018-02-05 NOTE — Progress Notes (Addendum)
PROGRESS NOTE    David Keith  WGN:[id number] DOB: 03-Oct-1955 DOA: 02/04/2018 PCP: Massie Maroon, FNP    Brief Narrative:  62 year old male who presented with difficulty walking and left upper extremity weakness.  He does have the significant past medical history for hypertension, tobacco abuse and alcohol abuse.  Patient reported a 3-day history of left sided weakness with worsening ambulatory dysfunction to the point where he is not able to ambulate.  The weakness has been associated with paresthesias on the left side.  He sustained a mechanical fall, and was seen at the emergency department he was worked up with hip x-rays, which resulted negative for fracture, he was discharged home.  At home patient had persistent symptoms of weakness, and had a second mechanical fall with head trauma but no loss consciousness.  He presented to the hospital due to his persistent symptoms.  Sodium 142, potassium 3.9, chloride 105, glucose 24, BUN 15, creatinine 0.80 white cell count 3.8, hemoglobin 13.2, hematocrit 39.1, platelets 171.  His head CT was normal.  Chest x-ray negative for infiltrates.  EKG normal sinus rhythm, normal axis, poor R wave progression, LVH per voltage criteria.   Patient was admitted to the hospital with focal neurologic deficit, to rule out cerebrovascular accident.    Assessment & Plan:   Principal Problem:   Ataxia Active Problems:   Hypertension   Hepatitis C   Weakness  1.  C1-C5 prevertebral fluid collection, C3-C4 disc herniation resulting in spinal stenosis with cord mass-effect, moderate to severe right greater than left C4 foraminal stenosis. Case has been discussed with Radiology. Patient continue to be symptomatic, no exam strength is 4/5. Will continue neuro checks q4 hours. I have consulted neurosurgery for further evaluation. Patient may need systemic steroids for cord edema. Will hold clopidogrel for now.   2. HTN. Continue blood pressure control with  amlodipine.   3. Etho abuse. Alcohol level on admission was less than 10, but on 02/03/2018 was 108. No current signs of withdrawal, will continue as needed benzodiazepines to prevent alcohol withdrawal, will continue thiamine, folic acid and multivitamins.   4. Tobacco abuse. Smoking cessation.   5. Hep C. No clinical signs of acute liver failure.   Late entry: case discussed with neurosurgery, no indication for surgery. Will order soft tissue neck for further workup. No clinical signs of airway compromise.   DVT prophylaxis: enoxaparin   Code Status: full Family Communication: No family at the bedside  Disposition Plan: pending neurosurgery evaluation    Consultants:   Neurosurgery   Neurology   Procedures:     Antimicrobials:       Subjective: Patient continue to have paresthesias on the right along with weakness, not able to ambulate, no nausea or vomiting, no chest pain, or dyspnea.   Objective: Vitals:   02/05/18 0105 02/05/18 0502 02/05/18 0754 02/05/18 0919  BP: (!) 156/95 (!) 168/96 (!) 141/95 (!) 150/93  Pulse: 67 65 62 60  Resp: Temp: 98.4 F (36.9 C) 97.7 F (36.5 C)  97.6 F (36.4 C)  TempSrc: Oral   Oral  SpO2: 99% 100% 96%   Weight:      Height:       No intake or output data in the 24 hours ending 02/05/18 1142 Filed Weights   02/04/18 2006  Weight: 66.2 kg (146 lb)    Examination:   General: deconditioned.  Neurology: right upper and lower extremity with 4/5 strength proximal and  distal.   E ENT: no pallor, no icterus, oral mucosa moist Cardiovascular: No JVD. S1-S2 present, rhythmic, no gallops, rubs, or murmurs. No lower extremity edema. Pulmonary: vesicular breath sounds bilaterally, adequate air movement, no wheezing, rhonchi or rales. Gastrointestinal. Abdomen flat, no organomegaly, non tender, no rebound or guarding Skin. No rashes Musculoskeletal: no joint deformities     Data Reviewed: I have personally reviewed  following labs and imaging studies  CBC: Recent Labs  Lab 02/03/18 0700 02/04/18 2016 02/04/18 2026 02/05/18 0953  WBC 5.0 5.4  --  3.8*  NEUTROABS 2.9 3.7  --   --   HGB 12.9* 13.0 15.0 13.2  HCT 37.5* 37.8* 44.0 39.1  MCV 88.4 88.9  --  88.5  PLT 208 204  --  171   Basic Metabolic Panel: Recent Labs  Lab 02/03/18 0700 02/04/18 2016 02/04/18 2026 02/05/18 0953  NA 143 141 142  --   K 3.9 3.9 3.9  --   CL 112* 105 105  --   CO2 20* 27  --   --   GLUCOSE 93 105* 104*  --   BUN --   CREATININE 0.80 0.87 0.80 0.79  CALCIUM 8.8* 9.3  --   --    GFR: Estimated Creatinine Clearance: 88.5 mL/min (by C-G formula based on SCr of 0.79 mg/dL). Liver Function Tests: Recent Labs  Lab 02/03/18 0700 02/04/18 2016  AST 38 41  ALT 30 31  ALKPHOS 68 68  BILITOT 0.5 0.5  PROT 6.6 6.8  ALBUMIN 3.9 4.1   No results for input(s): LIPASE, AMYLASE in the last 168 hours. No results for input(s): AMMONIA in the last 168 hours. Coagulation Profile: Recent Labs  Lab 02/04/18 2016  INR 0.94   Cardiac Enzymes: No results for input(s): CKTOTAL, CKMB, CKMBINDEX, TROPONINI in the last 168 hours. BNP (last 3 results) No results for input(s): PROBNP in the last 8760 hours. HbA1C: Recent Labs    02/05/18 0953  HGBA1C 5.1   CBG: No results for input(s): GLUCAP in the last 168 hours. Lipid Profile: Recent Labs    02/05/18 0953  CHOL 163  HDL 68  LDLCALC 79  TRIG 81  CHOLHDL 2.4   Thyroid Function Tests: Recent Labs    02/05/18 0954  TSH 1.714   Anemia Panel: Recent Labs    02/05/18 0953  VITAMINB12 406      Radiology Studies: I have reviewed all of the imaging during this hospital visit personally     Scheduled Meds: .  stroke: mapping our early stages of recovery book   Does not apply Once  . [START ON 02/06/2018] amLODipine  10 mg Oral Daily  . aspirin  300 mg Rectal Daily   Or  . aspirin  325 mg Oral Daily  . clopidogrel  300 mg Oral Once    . [START ON 02/06/2018] clopidogrel  75 mg Oral Daily  . enoxaparin (LOVENOX) injection  40 mg Subcutaneous Daily  . folic acid  1 mg Oral Daily  . iopamidol      . LORazepam  0-4 mg Oral Q6H   Followed by  . [START ON 02/07/2018] LORazepam  0-4 mg Oral Q12H  . multivitamin with minerals  1 tablet Oral Daily  . thiamine  100 mg Oral Daily   Or  . thiamine  100 mg Intravenous Daily   Continuous Infusions: . sodium chloride       LOS: 0 days  Vladimir Lenhoff Gerome Apley, MD [facility] Pager [phone]

## 2018-02-06 ENCOUNTER — Observation Stay (HOSPITAL_COMMUNITY): Payer: Medicare Other

## 2018-02-06 DIAGNOSIS — Z720 Tobacco use: Secondary | ICD-10-CM

## 2018-02-06 DIAGNOSIS — R29898 Other symptoms and signs involving the musculoskeletal system: Secondary | ICD-10-CM | POA: Diagnosis not present

## 2018-02-06 DIAGNOSIS — F1010 Alcohol abuse, uncomplicated: Secondary | ICD-10-CM | POA: Diagnosis not present

## 2018-02-06 DIAGNOSIS — E876 Hypokalemia: Secondary | ICD-10-CM

## 2018-02-06 DIAGNOSIS — I10 Essential (primary) hypertension: Secondary | ICD-10-CM

## 2018-02-06 DIAGNOSIS — M549 Dorsalgia, unspecified: Secondary | ICD-10-CM | POA: Diagnosis not present

## 2018-02-06 DIAGNOSIS — M5020 Other cervical disc displacement, unspecified cervical region: Secondary | ICD-10-CM | POA: Diagnosis not present

## 2018-02-06 DIAGNOSIS — M4802 Spinal stenosis, cervical region: Secondary | ICD-10-CM | POA: Diagnosis not present

## 2018-02-06 DIAGNOSIS — B1920 Unspecified viral hepatitis C without hepatic coma: Secondary | ICD-10-CM

## 2018-02-06 DIAGNOSIS — R270 Ataxia, unspecified: Secondary | ICD-10-CM | POA: Diagnosis not present

## 2018-02-06 DIAGNOSIS — G8929 Other chronic pain: Secondary | ICD-10-CM | POA: Diagnosis not present

## 2018-02-06 DIAGNOSIS — D62 Acute posthemorrhagic anemia: Secondary | ICD-10-CM

## 2018-02-06 DIAGNOSIS — M4712 Other spondylosis with myelopathy, cervical region: Secondary | ICD-10-CM | POA: Diagnosis not present

## 2018-02-06 DIAGNOSIS — I6523 Occlusion and stenosis of bilateral carotid arteries: Secondary | ICD-10-CM | POA: Diagnosis not present

## 2018-02-06 LAB — CBC
HCT: 36.6 % — ABNORMAL LOW (ref 39.0–52.0)
HEMOGLOBIN: 12.4 g/dL — AB (ref 13.0–17.0)
MCH: 29.8 pg (ref 26.0–34.0)
MCHC: 33.9 g/dL (ref 30.0–36.0)
MCV: 88 fL (ref 78.0–100.0)
PLATELETS: 189 10*3/uL (ref 150–400)
RBC: 4.16 MIL/uL — AB (ref 4.22–5.81)
RDW: 15 % (ref 11.5–15.5)
WBC: 3.4 10*3/uL — AB (ref 4.0–10.5)

## 2018-02-06 LAB — HIV ANTIBODY (ROUTINE TESTING W REFLEX): HIV Screen 4th Generation wRfx: NONREACTIVE

## 2018-02-06 LAB — COMPREHENSIVE METABOLIC PANEL
ALK PHOS: 58 U/L (ref 38–126)
ALT: 27 U/L (ref 17–63)
ANION GAP: 8 (ref 5–15)
AST: 31 U/L (ref 15–41)
Albumin: 3.1 g/dL — ABNORMAL LOW (ref 3.5–5.0)
BUN: 9 mg/dL (ref 6–20)
CALCIUM: 8.6 mg/dL — AB (ref 8.9–10.3)
CO2: 26 mmol/L (ref 22–32)
CREATININE: 0.76 mg/dL (ref 0.61–1.24)
Chloride: 108 mmol/L (ref 101–111)
Glucose, Bld: 104 mg/dL — ABNORMAL HIGH (ref 65–99)
Potassium: 3.4 mmol/L — ABNORMAL LOW (ref 3.5–5.1)
Sodium: 142 mmol/L (ref 135–145)
Total Bilirubin: 0.7 mg/dL (ref 0.3–1.2)
Total Protein: 5.8 g/dL — ABNORMAL LOW (ref 6.5–8.1)

## 2018-02-06 LAB — RPR: RPR Ser Ql: NONREACTIVE

## 2018-02-06 LAB — FOLATE RBC
FOLATE, RBC: 946 ng/mL (ref >498)
Folate, Hemolysate: 371.9 ng/mL
Hematocrit: 39.3 % (ref 37.5–51.0)

## 2018-02-06 MED ORDER — IOHEXOL 300 MG/ML  SOLN
75.0000 mL | Freq: Once | INTRAMUSCULAR | Status: AC | PRN
  Administered 2018-02-06: 75 mL via INTRAVENOUS

## 2018-02-06 NOTE — Consult Note (Signed)
Reason for Consult: Cervical spondylosis, cervical stenosis, cervical myelopathy Referring Physician: Dr. Brantley David Keith is an 62 y.o. male.  HPI: David Keith is a 62 year old black male on whom I previously performed lumbar surgery.  I have not seen him in years.  The patient is not a good medical historian.  The best I can tell he has taken at least 2 falls and may have fractured a rib during 1 of his falls about a month ago.  He has noticed over the last few weeks that he has been weak on his left side.  He was admitted to [facility] for a stroke work-up which turned out negative.  A cervical MRI demonstrated cervical spondylosis and significant stenosis at C3-4.  A neurosurgical consultation is requested.  Presently the patient is accompanied by his wife.  He denies neck pain.  He is not a good medical historian.  He admits to being weak on the left.  It is difficult for me to know how long he has been weak.  Past Medical History:  Diagnosis Date  . Chronic back pain    radiculopathy and stenosis  . Hypertension    takes AMlodipine daily  . Joint swelling    right knee  . Weakness    numbness in both legs when walking    Past Surgical History:  Procedure Laterality Date  . COLONOSCOPY    . KNEE ARTHROSCOPY Right 2011    Family History  Problem Relation Age of Onset  . Cancer Mother   . Heart disease Father   . Diabetes Brother     Social History:  reports that he has been smoking cigarettes.  He has a 10.00 pack-year smoking history. He has never used smokeless tobacco. He reports that he drinks about 1.2 oz of alcohol per week. He reports that he has current or past drug history. Drug: Marijuana.  Allergies:  Allergies  Allergen Reactions  . Flexeril [Cyclobenzaprine] Other (See Comments)    Gives him weird thoughts  . Other     NO BLOOD PRODUCTS. (pt will accept Albumin)    Medications:  I have reviewed the patient's current medications. Prior  to Admission:  Medications Prior to Admission  Medication Sig Dispense Refill Last Dose  . amLODipine (NORVASC) 10 MG tablet TAKE 1 TABLET BY MOUTH EVERY DAY. (Patient taking differently: Take 10 mg by mouth daily. ) 30 tablet 5 02/04/2018 at Unknown time  . diclofenac sodium (VOLTAREN) 1 % GEL Apply 2 g topically 4 (four) times daily. (Patient taking differently: Apply 2 g topically 4 (four) times daily as needed (pain). ) 1 Tube 2 unk  . HYDROcodone-acetaminophen (NORCO/VICODIN) 5-325 MG tablet Take 1-2 tablets by mouth every 4 (four) hours as needed for moderate pain. 40 tablet 0 Past Week at Unknown time  . ibuprofen (ADVIL,MOTRIN) 200 MG tablet Take 400 mg by mouth every 6 (six) hours as needed for moderate pain.   unk  . meloxicam (MOBIC) 7.5 MG tablet Take 1 tablet (7.5 mg total) by mouth daily. 30 tablet 2 02/04/2018 at Unknown time   Scheduled: .  stroke: mapping our early stages of recovery book   Does not apply Once  . amLODipine  10 mg Oral Daily  . enoxaparin (LOVENOX) injection  40 mg Subcutaneous Daily  . folic acid  1 mg Oral Daily  . LORazepam  0-4 mg Oral Q6H   Followed by  . [START ON 02/07/2018] LORazepam  0-4 mg Oral  Q12H  . multivitamin with minerals  1 tablet Oral Daily  . thiamine  100 mg Oral Daily   Or  . thiamine  100 mg Intravenous Daily   Continuous:  IAX:[id number] **OR** acetaminophen (TYLENOL) oral liquid 160 mg/5 mL **OR** acetaminophen, hydrALAZINE, LORazepam **OR** LORazepam Anti-infectives (From admission, onward)   None       Results for orders placed or performed during the hospital encounter of 02/04/18 (from the past 48 hour(s))  Protime-INR     Status: None   Collection Time: 02/04/18  8:16 PM  Result Value Ref Range   Prothrombin Time 12.5 11.4 - 15.2 seconds   INR 0.94     Comment: Performed at [facility] [address] [place], [place] [address]  APTT     Status: None   Collection Time: 02/04/18  8:16 PM  Result Value Ref  Range   aPTT 27 24 - 36 seconds    Comment: Performed at [facility] [address], [place], [place] [address]  CBC     Status: Abnormal   Collection Time: 02/04/18  8:16 PM  Result Value Ref Range   WBC 5.4 4.0 - 10.5 K/uL   RBC 4.25 4.22 - 5.81 MIL/uL   Hemoglobin 13.0 13.0 - 17.0 g/dL   HCT 37.8 (L) 39.0 - 52.0 %   MCV 88.9 78.0 - 100.0 fL   MCH 30.6 26.0 - 34.0 pg   MCHC 34.4 30.0 - 36.0 g/dL   RDW 14.9 11.5 - 15.5 %   Platelets 204 150 - 400 K/uL    Comment: Performed at [facility] [address] [place], [place] [address]  Differential     Status: None   Collection Time: 02/04/18  8:16 PM  Result Value Ref Range   Neutrophils Relative % 66 %   Neutro Abs 3.7 1.7 - 7.7 K/uL   Lymphocytes Relative 21 %   Lymphs Abs 1.1 0.7 - 4.0 K/uL   Monocytes Relative 12 %   Monocytes Absolute 0.6 0.1 - 1.0 K/uL   Eosinophils Relative 1 %   Eosinophils Absolute 0.0 0.0 - 0.7 K/uL   Basophils Relative 0 %   Basophils Absolute 0.0 0.0 - 0.1 K/uL    Comment: Performed at [facility] [address], [place], [place] [address]  Comprehensive metabolic panel     Status: Abnormal   Collection Time: 02/04/18  8:16 PM  Result Value Ref Range   Sodium 141 135 - 145 mmol/L   Potassium 3.9 3.5 - 5.1 mmol/L   Chloride 105 101 - 111 mmol/L   CO2 27 22 - 32 mmol/L   Glucose, Bld 105 (H) 65 - 99 mg/dL   BUN 14 6 - 20 mg/dL   Creatinine, Ser 0.87 0.61 - 1.24 mg/dL   Calcium 9.3 8.9 - 10.3 mg/dL   Total Protein 6.8 6.5 - 8.1 g/dL   Albumin 4.1 3.5 - 5.0 g/dL   AST 41 15 - 41 U/L   ALT 31 17 - 63 U/L   Alkaline Phosphatase 68 38 - 126 U/L   Total Bilirubin 0.5 0.3 - 1.2 mg/dL   GFR calc non Af Amer >60 >60 mL/min   GFR calc Af Amer >60 >60 mL/min    Comment: (NOTE) The eGFR has been calculated using the CKD EPI equation. This calculation has not been validated in all clinical situations. eGFR's persistently <60 mL/min signify possible Chronic Kidney Disease.  Anion gap 9 5 - 15    Comment: Performed at [facility] [address] [place], [place] [address]  I-stat troponin, ED     Status: None   Collection Time: 02/04/18  8:24 PM  Result Value Ref Range   Troponin i, poc 0.01 0.00 - 0.08 ng/mL   Comment 3            Comment: Due to the release kinetics of cTnI, a negative result within the first hours of the onset of symptoms does not rule out myocardial infarction with certainty. If myocardial infarction is still suspected, repeat the test at appropriate intervals.   I-Stat Chem 8, ED     Status: Abnormal   Collection Time: 02/04/18  8:26 PM  Result Value Ref Range   Sodium 142 135 - 145 mmol/L   Potassium 3.9 3.5 - 5.1 mmol/L   Chloride 105 101 - 111 mmol/L   BUN 15 6 - 20 mg/dL   Creatinine, Ser 0.80 0.61 - 1.24 mg/dL   Glucose, Bld 104 (H) 65 - 99 mg/dL   Calcium, Ion 1.16 1.15 - 1.40 mmol/L   TCO2 24 22 - 32 mmol/L   Hemoglobin 15.0 13.0 - 17.0 g/dL   HCT 44.0 39.0 - 52.0 %  Ethanol     Status: None   Collection Time: 02/05/18  4:54 AM  Result Value Ref Range   Alcohol, Ethyl (B) <10 <10 mg/dL    Comment:        LOWEST DETECTABLE LIMIT FOR SERUM ALCOHOL IS 10 mg/dL FOR MEDICAL PURPOSES ONLY Performed at [facility], [place] [address], [place], [place] [address]   HIV antibody (Routine Testing)     Status: None   Collection Time: 02/05/18  9:53 AM  Result Value Ref Range   HIV Screen 4th Generation wRfx Non Reactive Non Reactive    Comment: (NOTE) Performed At: [facility] [address] [place], [place] [id number] Rush Farmer MD [id number] Performed at [facility], [place] [address] [place], [place] [address]   Hemoglobin A1c     Status: None   Collection Time: 02/05/18  9:53 AM  Result Value Ref Range   Hgb A1c MFr Bld 5.1 4.8 - 5.6 %    Comment: (NOTE) Pre diabetes:          5.7%-6.4% Diabetes:              >6.4% Glycemic control for   <7.0% adults with diabetes    Mean  Plasma Glucose 99.67 mg/dL    Comment: Performed at [facility] [address] [place] [address] [place], [place] [address]  Lipid panel     Status: None   Collection Time: 02/05/18  9:53 AM  Result Value Ref Range   Cholesterol 163 0 - 200 mg/dL   Triglycerides 81 <150 mg/dL   HDL 68 >40 mg/dL   Total CHOL/HDL Ratio 2.4 RATIO   VLDL 16 0 - 40 mg/dL   LDL Cholesterol 79 0 - 99 mg/dL    Comment:        Total Cholesterol/HDL:CHD Risk Coronary Heart Disease Risk Table                     Men   Women  1/2 Average Risk   3.4   3.3  Average Risk       5.0   4.4  2 X Average Risk   9.6   7.1  3 X Average Risk  23.4   11.0        Use the calculated Patient Ratio above and the CHD Risk Table to determine the patient's CHD Risk.        ATP III CLASSIFICATION (LDL):  <100     mg/dL   Optimal  100-129  mg/dL   Near or Above                    Optimal  130-159  mg/dL   Borderline  160-189  mg/dL   High  >190     mg/dL   Very High Performed at [facility] [address] [place], [place] [address]   CBC     Status: Abnormal   Collection Time: 02/05/18  9:53 AM  Result Value Ref Range   WBC 3.8 (L) 4.0 - 10.5 K/uL   RBC 4.42 4.22 - 5.81 MIL/uL   Hemoglobin 13.2 13.0 - 17.0 g/dL   HCT 39.1 39.0 - 52.0 %   MCV 88.5 78.0 - 100.0 fL   MCH 29.9 26.0 - 34.0 pg   MCHC 33.8 30.0 - 36.0 g/dL   RDW 15.3 11.5 - 15.5 %   Platelets 171 150 - 400 K/uL    Comment: Performed at [facility], [place] [address], [place], [place] [address]  Creatinine, serum     Status: None   Collection Time: 02/05/18  9:53 AM  Result Value Ref Range   Creatinine, Ser 0.79 0.61 - 1.24 mg/dL   GFR calc non Af Amer >60 >60 mL/min   GFR calc Af Amer >60 >60 mL/min    Comment: (NOTE) The eGFR has been calculated using the CKD EPI equation. This calculation has not been validated in all clinical situations. eGFR's persistently <60 mL/min signify possible Chronic Kidney Disease. Performed at [facility], [place] [address], [place], [place] [address]   Vitamin B12     Status: None   Collection Time: 02/05/18  9:53 AM  Result Value Ref Range   Vitamin B-12 406 180 - 914 pg/mL    Comment: (NOTE) This assay is not validated for testing neonatal or myeloproliferative syndrome specimens for Vitamin B12 levels. Performed at [facility], [place] [address] [place], [place] [address]   RPR     Status: None   Collection Time: 02/05/18  9:53 AM  Result Value Ref Range   RPR Ser Ql Non Reactive Non Reactive    Comment: (NOTE) Performed At: [facility] [place], [place] [id number] Rush Farmer MD [id number] Performed at [facility], [place] [address] [place], [place] [address]   Uric acid     Status: None   Collection Time: 02/05/18  9:53 AM  Result Value Ref Range   Uric Acid, Serum 5.0 4.4 - 7.6 mg/dL    Comment: Performed at [facility] [address] [place], [place] [address]  TSH     Status: None   Collection Time: 02/05/18  9:54 AM  Result Value Ref Range   TSH 1.714 0.350 - 4.500 uIU/mL    Comment: Performed by a 3rd Generation assay with a functional sensitivity of <=0.01 uIU/mL. Performed at [facility], [place] [address] [place], [place] [address]   Comprehensive metabolic panel     Status: Abnormal   Collection Time: 02/06/18  5:02 AM  Result Value Ref Range   Sodium 142 135 -  145 mmol/L   Potassium 3.4 (L) 3.5 - 5.1 mmol/L   Chloride 108 101 - 111 mmol/L   CO2 26 22 - 32 mmol/L   Glucose, Bld 104 (H) 65 - 99 mg/dL   BUN 9 6 - 20 mg/dL   Creatinine, Ser 0.76 0.61 - 1.24 mg/dL   Calcium 8.6 (L) 8.9 - 10.3 mg/dL   Total Protein 5.8 (L) 6.5 - 8.1 g/dL   Albumin 3.1 (L) 3.5 - 5.0 g/dL   AST 31 15 - 41 U/L   ALT 27 17 - 63 U/L   Alkaline Phosphatase 58 38 - 126 U/L   Total Bilirubin 0.7 0.3 - 1.2 mg/dL   GFR calc non Af Amer >60 >60 mL/min   GFR calc Af Amer >60 >60 mL/min    Comment: (NOTE) The eGFR has been calculated  using the CKD EPI equation. This calculation has not been validated in all clinical situations. eGFR's persistently <60 mL/min signify possible Chronic Kidney Disease.    Anion gap 8 5 - 15    Comment: Performed at [facility] [address], [place], [place] [address]  CBC     Status: Abnormal   Collection Time: 02/06/18  5:02 AM  Result Value Ref Range   WBC 3.4 (L) 4.0 - 10.5 K/uL   RBC 4.16 (L) 4.22 - 5.81 MIL/uL   Hemoglobin 12.4 (L) 13.0 - 17.0 g/dL   HCT 36.6 (L) 39.0 - 52.0 %   MCV 88.0 78.0 - 100.0 fL   MCH 29.8 26.0 - 34.0 pg   MCHC 33.9 30.0 - 36.0 g/dL   RDW 15.0 11.5 - 15.5 %   Platelets 189 150 - 400 K/uL    Comment: Performed at [facility], [place] [address], [place], [place] [address]    Ct Angio Head W Or Wo Contrast  Result Date: 02/05/2018 CLINICAL DATA:  Stroke follow-up.  Left-sided weakness EXAM: CT ANGIOGRAPHY HEAD AND NECK TECHNIQUE: Multidetector CT imaging of the head and neck was performed using the standard protocol during bolus administration of intravenous contrast. Multiplanar CT image reconstructions and MIPs were obtained to evaluate the vascular anatomy. Carotid stenosis measurements (when applicable) are obtained utilizing NASCET criteria, using the distal internal carotid diameter as the denominator. CONTRAST:  84m ISOVUE-370 IOPAMIDOL (ISOVUE-370) INJECTION 76% COMPARISON:  Head CT from yesterday.  Brain MRI from earlier today FINDINGS: CTA NECK FINDINGS Aortic arch: Atherosclerotic calcification. Two vessel branching pattern. Right carotid system: Mild calcified plaque at the ICA bulb. No stenosis or ulceration. Left carotid system: Mild to moderate atherosclerotic plaque at the common carotid bifurcation. No stenosis or ulceration. Vertebral arteries: No proximal subclavian stenosis. There aberrant right vertebral artery arising from the arch. The left vertebral artery is dominant. No vertebral stenosis. Skeleton: Cervical disc and facet  degeneration. There is a visible downward migrating disc protrusion at C3-4, impinging on the cord. Retropharyngeal effusion extending from C1 to C5. No posterior longitudinal ligament ossification. No visible osteomyelitis. Other neck: Retropharyngeal effusion as noted above. No visible pharyngeal or laryngeal inflammation. Calcification in the lower left maxillary sinus, likely calcified retention cyst or radicular cyst. Upper chest: Probable airway thickening. Minimal emphysematous change. Review of the MIP images confirms the above findings CTA HEAD FINDINGS Anterior circulation: Atherosclerotic plaque on the carotid siphons without flow limiting stenosis. No branch occlusion, beading, or aneurysm. Posterior circulation: Strong left vertebral artery dominance. The right vertebral artery ends in PICA. Vertebral and basilar arteries are smooth and  diffusely patent. Large posterior communicating arteries. Robust and symmetric bilateral PCA flow Venous sinuses: Patent Anatomic variants: None unusual. Incomplete circle-of-Willis at the anterior communicating artery Delayed phase: No abnormal intracranial enhancement. A call has been placed to the hospitalist service. Review of the MIP images confirms the above findings IMPRESSION: 1. No emergent vascular finding. There is atherosclerosis without flow limiting stenosis or ulceration. 2. Retropharyngeal effusion without visible mucosal or spinal source. This could be a manifestation of spinal infection in this patient with neurologic deficits. Suggest MRI the cervical spine with contrast. 3. 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 Electronically Signed   By: Monte Fantasia M.D.   On: 02/05/2018 08:34   Dg Chest 1 View  Result Date: 02/05/2018 CLINICAL DATA:  Weakness EXAM: CHEST  1 VIEW COMPARISON:  02/03/2018 FINDINGS: Heart and mediastinal contours are within normal limits. No focal opacities or effusions. No acute bony  abnormality. IMPRESSION: No active disease. Electronically Signed   By: Rolm Baptise M.D.   On: 02/05/2018 09:03   Dg Knee 1-2 Views Right  Result Date: 02/05/2018 CLINICAL DATA:  Chronic pain EXAM: RIGHT KNEE - 1-2 VIEW COMPARISON:  Right knee radiographs April 13, 2007 and right knee MRI April 19, 2007 FINDINGS: Frontal and lateral views were obtained. No acute fracture or dislocation. There is a sizable joint effusion. There is marked osteoarthritic change diffusely with essentially complete loss of joint space medially as well as marked narrowing in the patellofemoral joint region. There is generalized bony overgrowth. IMPRESSION: Advanced arthropathy with marked progression from prior studies. There is a sizable joint effusion. There is prominent spurring in all compartments. There is sclerosis and essentially complete joint space loss medially. Marked spurring and marked narrowing in the patellofemoral joint region noted. No acute fracture or dislocation evident. Electronically Signed   By: Lowella Grip III M.D.   On: 02/05/2018 09:07   Ct Head Wo Contrast  Result Date: 02/04/2018 CLINICAL DATA:  Left-sided weakness EXAM: CT HEAD WITHOUT CONTRAST TECHNIQUE: Contiguous axial images were obtained from the base of the skull through the vertex without intravenous contrast. COMPARISON:  None. FINDINGS: Brain: No mass lesion, intraparenchymal hemorrhage or extra-axial collection. No evidence of acute cortical infarct. Normal appearance of the brain parenchyma and extra axial spaces for age. Vascular: No hyperdense vessel or unexpected vascular calcification. Skull: Normal visualized skull base, calvarium and extracranial soft tissues. Sinuses/Orbits: Mineralized area within the left maxillary sinus, without thickening of the maxillary sinus walls. No sinus mucosal abnormality. Normal orbits. IMPRESSION: 1. Normal brain. 2. 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 Electronically Signed   By: Ulyses Jarred M.D.   On: 02/04/2018 21:01   Ct Soft Tissue Neck W Contrast  Result Date: 02/06/2018 CLINICAL DATA:  62 y/o M; retropharyngeal/prevertebral fluid collection. EXAM: CT NECK WITH CONTRAST TECHNIQUE: Multidetector CT imaging of the neck was performed using the standard protocol following the bolus administration of intravenous contrast. CONTRAST:  46m OMNIPAQUE IOHEXOL 300 MG/ML  SOLN COMPARISON:  02/05/2018 MRI cervical spine and CT angiogram of the neck. FINDINGS: Pharynx and larynx: Normal. No mass or swelling. Salivary glands: No inflammation, mass, or stone. Thyroid: Normal. Lymph nodes: None enlarged or abnormal density. Vascular: Mild calcific atherosclerosis of the aortic arch and the bilateral carotid bifurcations. Limited intracranial: Negative. Visualized orbits: Negative. Mastoids and visualized paranasal sinuses: There is a dome-shaped bony structure extending from the left  maxillary alveolar bone in the oral cavity into the left maxillary sinus antrum which may represent a chronically calcified oral antral fistula or dental cavity. Skeleton: Multilevel degenerative changes of the cervical spine greatest at the C3-4 level for a disc herniation results in canal stenosis better characterized on the prior cervical MRI. Prevertebral fluid collection extending from the C1 to the C5 vertebral body (series 7, image 52). There is no rim enhancement of the prevertebral fluid collection and there is no appreciable inflammatory change in the surrounding deep cervical compartments. Upper chest: Negative. Other: None. IMPRESSION: 1. Again seen is a prevertebral/retropharyngeal fluid collection extending from C1-C5. No peripheral enhancement of the fluid collection and no inflammation of surrounding deep cervical compartments is identified. The fluid collection is probably a reactive effusion possibly related to  adenoid tonsillitis, longus coli tendinitis, or possibly sequelae of trauma as discussed on prior studies. Abscess is unlikely. 2. Mild calcific atherosclerosis of the aorta and bilateral carotid bifurcations. 3. Spondylosis of the cervical spine greatest at C3-4 where a disc herniation results in canal stenosis. Electronically Signed   By: Kristine Garbe M.D.   On: 02/06/2018 02:15   Ct Angio Neck W Or Wo Contrast  Result Date: 02/05/2018 CLINICAL DATA:  Stroke follow-up.  Left-sided weakness EXAM: CT ANGIOGRAPHY HEAD AND NECK TECHNIQUE: Multidetector CT imaging of the head and neck was performed using the standard protocol during bolus administration of intravenous contrast. Multiplanar CT image reconstructions and MIPs were obtained to evaluate the vascular anatomy. Carotid stenosis measurements (when applicable) are obtained utilizing NASCET criteria, using the distal internal carotid diameter as the denominator. CONTRAST:  48m ISOVUE-370 IOPAMIDOL (ISOVUE-370) INJECTION 76% COMPARISON:  Head CT from yesterday.  Brain MRI from earlier today FINDINGS: CTA NECK FINDINGS Aortic arch: Atherosclerotic calcification. Two vessel branching pattern. Right carotid system: Mild calcified plaque at the ICA bulb. No stenosis or ulceration. Left carotid system: Mild to moderate atherosclerotic plaque at the common carotid bifurcation. No stenosis or ulceration. Vertebral arteries: No proximal subclavian stenosis. There aberrant right vertebral artery arising from the arch. The left vertebral artery is dominant. No vertebral stenosis. Skeleton: Cervical disc and facet degeneration. There is a visible downward migrating disc protrusion at C3-4, impinging on the cord. Retropharyngeal effusion extending from C1 to C5. No posterior longitudinal ligament ossification. No visible osteomyelitis. Other neck: Retropharyngeal effusion as noted above. No visible pharyngeal or laryngeal inflammation. Calcification in the  lower left maxillary sinus, likely calcified retention cyst or radicular cyst. Upper chest: Probable airway thickening. Minimal emphysematous change. Review of the MIP images confirms the above findings CTA HEAD FINDINGS Anterior circulation: Atherosclerotic plaque on the carotid siphons without flow limiting stenosis. No branch occlusion, beading, or aneurysm. Posterior circulation: Strong left vertebral artery dominance. The right vertebral artery ends in PICA. Vertebral and basilar arteries are smooth and diffusely patent. Large posterior communicating arteries. Robust and symmetric bilateral PCA flow Venous sinuses: Patent Anatomic variants: None unusual. Incomplete circle-of-Willis at the anterior communicating artery Delayed phase: No abnormal intracranial enhancement. A call has been placed to the hospitalist service. Review of the MIP images confirms the above findings IMPRESSION: 1. No emergent vascular finding. There is atherosclerosis without flow limiting stenosis or ulceration. 2. Retropharyngeal effusion without visible mucosal or spinal source. This could be a manifestation of spinal infection in this patient with neurologic deficits. Suggest MRI the cervical spine with contrast. 3. 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 Electronically Signed   By: JAngelica Chessman  Watts M.D.   On: 02/05/2018 08:34   Mr Brain Wo Contrast  Result Date: 02/05/2018 CLINICAL DATA:  Left-sided weakness EXAM: MRI HEAD WITHOUT CONTRAST TECHNIQUE: Multiplanar, multiecho pulse sequences of the brain and surrounding structures were obtained without intravenous contrast. COMPARISON:  Head CT from yesterday FINDINGS: Brain: No acute infarction, hemorrhage, hydrocephalus, extra-axial collection or mass lesion. Mild for age FLAIR hyperintensity in the cerebral white matter Vascular: Major flow voids are preserved Skull and upper cervical spine: Normal marrow signal Sinuses/Orbits: Polypoid density  in the inferior left maxillary sinus that is calcified by CT, likely calcified retention cyst. IMPRESSION: Negative for infarct.  No explanation for symptoms. Electronically Signed   By: Monte Fantasia M.D.   On: 02/05/2018 07:43   Mr Cervical Spine W Wo Contrast  Addendum Date: 02/05/2018   ADDENDUM REPORT: 02/05/2018 11:55 ADDENDUM: Study discussed by telephone with Dr. Sander Radon on 02/05/2018 at 1147 hours. One possibility we discussed regarding the indeterminate abnormal retropharyngeal space (#1) is recent trauma, such as a fall with anterior cervical ligamentous injury. In that hypothetical scenario, the disc herniation resulting in stenosis affecting the spinal cord with signal abnormality at C3-C4 (#2) might also be acute/recent. Electronically Signed   By: Genevie Ann M.D.   On: 02/05/2018 11:55   Result Date: 02/05/2018 CLINICAL DATA:  62 year old male with left upper extremity weakness, difficulty walking. Abnormal retropharyngeal space on CTA head and neck earlier today. EXAM: MRI CERVICAL SPINE WITHOUT AND WITH CONTRAST TECHNIQUE: Multiplanar and multiecho pulse sequences of the cervical spine, to include the craniocervical junction and cervicothoracic junction, were obtained without and with intravenous contrast. CONTRAST:  20m MULTIHANCE GADOBENATE DIMEGLUMINE 529 MG/ML IV SOLN COMPARISON:  CTA head and neck 0817 hours. Brain MRI 0655 hours today. FINDINGS: Alignment: Stable straightening of cervical lordosis as seen on the CTA today. Vertebrae: Heterogeneous bone marrow signal throughout the cervical spine, although no convincing marrow edema (subtle C6 and C7 increased vertebral body STIR signal is not correlated with decreased precontrast T1 bone marrow. Cord: Evidence of abnormal increased T2 and STIR signal within the spinal cord at C3-C4 corresponding to compression due to disc herniation and spinal stenosis. See additional details of that level below. No cord signal abnormality above  or below the C3-C4 level. No definite abnormal intradural enhancement. No dural thickening. Posterior Fossa, vertebral arteries, paraspinal tissues: Cervicomedullary junction is within normal limits. Negative visible brain parenchyma. Confluent abnormal fluid or edema signal in the retropharyngeal or prevertebral soft tissue space re-demonstrated (series 2, image 9, series 5, image 24), and measures up to 8 millimeters in thickness (series 5, image 19). However, postcontrast images demonstrate some intermediate signal rather than low signal in the epicenter of this finding (series 10, image 19) suggesting this may be edema rather than overt fluid. At the same time the adjacent longus coli muscles remain normal. There is no pharyngeal abnormality evident. Furthermore, although the underlying C3-C4 and C4-C5 discs are degenerated, there is no enhancement or inflammation identified within the disc spaces, and only trace degenerative appearing enhancement at the C3 anterior inferior endplate. There is no other signal abnormality in the cervical spine ligamentous complex. The other neck soft tissues appear negative. Negative visible lung apices. Disc levels: C2-C3: Disc bulge and endplate spurring plus right greater than left facet hypertrophy without significant spinal stenosis. There is mild to moderate right C3 foraminal stenosis. C3-C4: Circumferential disc bulge with superimposed moderate size disc extrusion or protrusion (see series 7, image 17) resulting in  mild to moderate spinal stenosis and cord mass effect with signal abnormality suspected in the cord as stated above. Right greater than left foraminal involvement and superimposed facet hypertrophy. Mild to moderate left and moderate to severe right C4 foraminal stenosis. C4-C5: Circumferential disc osteophyte complex with moderate to severe right facet hypertrophy. No significant spinal stenosis. Moderate to severe right C5 foraminal stenosis. C5-C6: Disc  space loss with circumferential disc osteophyte complex. Broad-based posterior component with mild spinal stenosis and spinal cord mass effect. No cord signal abnormality. Severe left and moderate to severe right C6 foraminal stenosis. C6-C7: Disc space loss with circumferential disc osteophyte complex. Mild ligament flavum hypertrophy. Spinal stenosis with up to mild spinal cord mass effect. Severe bilateral C7 foraminal stenosis. C7-T1: Circumferential disc osteophyte complex with broad-based posterior and biforaminal involvement. Mild facet hypertrophy. Mild spinal stenosis and possible spinal cord mass effect. Severe bilateral C8 foraminal stenosis. No upper thoracic spinal stenosis or cord signal abnormality. IMPRESSION: 1. Prominent fluid and/or edema in the retropharyngeal or prevertebral space tracking from the C1 to the C5 level as seen on CTA neck earlier today. This is nonspecific. There is NO strong evidence of underlying cervical discitis/osteomyelitis. The underlying longus coli muscles remain normal, and also the patient also lacks the typical calcifications of hydroxyapatite deposition disease on the CTA today, arguing against acute prevertebral/retropharyngeal tinnitus. 2. However, the study is positive for degenerative spinal stenosis at C3-C4 in part due to a moderate size disc herniation in the midline (series 7, image 17), resulting in spinal stenosis with cord mass effect and abnormal cord signal. Associated moderate to severe right greater than left C4 foraminal stenosis. 3. Advanced Cervical spine degeneration also C5-C6 through C7-T1 with additional spinal stenosis, severe bilateral foraminal stenosis, and up to mild spinal cord mass effect, but no additional cord signal abnormality. Electronically Signed: By: Genevie Ann M.D. On: 02/05/2018 11:43    ROS: As above Blood pressure (!) 138/91, pulse 71, temperature 97.9 F (36.6 C), temperature source Oral, resp. rate 20, height _0  (1.753  m), weight 66.2 kg (146 lb), SpO2 100 %. Estimated body mass index is 21.56 kg/m as calculated from the following:   Height as of this encounter: _1  (1.753 m).   Weight as of this encounter: 66.2 kg (146 lb).  Physical Exam General: An alert and thin 62 year old black male in no apparent distress  HEENT: Normocephalic, atraumatic, extraocular muscle intact  Neck: Supple without masses or deformities.  Spurling's testing was negative.  Lhermitte sign was not present  Thorax: Symmetric  Abdomen: Soft  Extremities: The patient has atrophy of his left hand intrinsics.  Neurologic exam: The patient is alert and oriented x3.  Cranial nerves II through XII are examined bilaterally and grossly normal.  The patient is mildly left hemiparetic mainly with weakness in his left hand.  He is a bit hyperreflexic in his lower extremities.  There is no ankle clonus.  Cerebellar function is intact and rapid altering movements of the upper extremities.  He has hand atrophy as above.  I reviewed the patient's cervical MRI performed at [facility] yesterday.  He has moderate spinal stenosis at C3-4 with evidence of spinal cord signal  abnormality.  He has multilevel spondylosis and degenerative changes. Assessment/Plan: Cervical spondylosis, cervical myelopathy: I have discussed the situation with the patient and his wife.  I do not think the MRI findings are acute particularly given his hand atrophy which indicates a more chronic process.  I do not think he needs urgent surgery but I do think he would benefit from a C3-4 anterior cervical discectomy, fusion and plating in the near future.  I briefly discussed this with him.  From my point of view he can be discharged to home after he is medically fit, and follow-up with me in the office for further discussion and reviewing of his images.  I have answered all the questions.  Please have him follow-up in the office after discharge.  Ophelia Charter 02/06/2018, 1:50 PM

## 2018-02-06 NOTE — Evaluation (Signed)
Occupational Therapy Evaluation Patient Details Name: David Keith MRN: [id number] DOB: 03/16/1956 Today's Date: 02/06/2018    History of Present Illness 62 year old male who presented with difficulty walking and left upper extremity weakness.  He does have the significant past medical history for hypertension, tobacco abuse and alcohol abuse.  Patient reported a 3-day history of left sided weakness with worsening ambulatory dysfunction to the point where he is not able to ambulate.  The weakness has been associated with paresthesias on the left side.  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   Clinical Impression   PTA Pt was mod I for ADL and mobility. He is currently set up for seated ADL, and has excellent flexibility to access his LB. His LUE demonstrates deficits in strength and ROM (greater than the right) and impacts his ability to perform grooming and ADL activities. His is min guard assist for safety and balance during transfers and benefitted greatly from using a gait belt as he had 4 LOB that required correction using the belt. Pt has no safety awareness and is at a very high risk of falling - especially as he lives on the 3rd floor and has to go up flights of steps. CIR level therapy recommended as the patient is motivated, determined, can withstand intense therapy and has excellent support of family. Next session to focus on safety during transfers and balance during sink level grooming.    Follow Up Recommendations  CIR;Supervision/Assistance - 24 hour    Equipment Recommendations  3 in 1 bedside commode;Tub/shower seat    Recommendations for Other Services       Precautions / Restrictions Precautions Precautions: Fall Restrictions Weight Bearing Restrictions: No      Mobility Bed  Mobility Overal bed mobility: Needs Assistance Bed Mobility: Sit to Supine       Sit to supine: Min guard;HOB elevated   General bed mobility comments: min giard for safety - min A for line management  Transfers Overall transfer level: Needs assistance Equipment used: Rolling walker (2 wheeled) Transfers: Sit to/from Stand Sit to Stand: Min guard;Min assist         General transfer comment: heavily relies on UE support at times, when less support lands back on seat and/or braces backs of knees on edge of chair, cues for hand placement    Balance Overall balance assessment: Needs assistance   Sitting balance-Leahy Scale: Good Sitting balance - Comments: able to bend over for sock management   Standing balance support: Bilateral upper extremity supported;During functional activity Standing balance-Leahy Scale: Poor Standing balance comment: requires external support during standing                           ADL either performed or assessed with clinical judgement   ADL Overall ADL's : Needs assistance/impaired Eating/Feeding: Set up;Sitting   Grooming: Wash/dry face;Oral care;Sitting;Set up Grooming Details (indicate cue type and reason): able to manage opening all containers, and perform without physical assist Upper Body Bathing: Sitting;Min guard   Lower Body Bathing: Min guard;Sitting/lateral leans   Upper Body Dressing : Minimal assistance;Sitting   Lower Body Dressing: Moderate assistance;Sit to/from stand Lower Body Dressing Details (indicate cue type and reason): able to don/doff socks with increased time, mod A to don scrub pants sit to stand Toilet Transfer: Minimal assistance;Ambulation;RW Toilet  Transfer Details (indicate cue type and reason): Pt VERY unsafe with mobility to bathroom despite cues Toileting- Clothing Manipulation and Hygiene: Min guard;Sitting/lateral lean       Functional mobility during ADLs: Minimal assistance;Cueing for  safety;Rolling walker General ADL Comments: Pt with very little self-awareness and safety awareness - able to complete most tasks safely in sitting position with increased time     Vision         Perception     Praxis      Pertinent Vitals/Pain Pain Assessment: 0-10 Pain Score: 2  Pain Location: L hip  Pain Descriptors / Indicators: Aching;Sore Pain Intervention(s): Monitored during session;Repositioned     Hand Dominance Right   Extremity/Trunk Assessment Upper Extremity Assessment Upper Extremity Assessment: RUE deficits/detail;LUE deficits/detail RUE Deficits / Details: decreased grip 4/5, but not as bad as L, able to demonstrate fingers to thumb, strength elbow flexion 4-/5, extension 3+/5, shoulder flex 4/5 RUE Sensation: decreased light touch RUE Coordination: decreased fine motor LUE Deficits / Details: decreased grip, L worse than R, finger to thumb required increased effort and time - unable to get to 3rd and 4th digit, strength elbow flexion 3/5, extension 3-/5, shoulder flex 4-/5 LUE Sensation: decreased light touch(L >R) LUE Coordination: decreased gross motor;decreased fine motor   Lower Extremity Assessment Lower Extremity Assessment: Defer to PT evaluation   Cervical / Trunk Assessment Cervical / Trunk Assessment: Other exceptions Cervical / Trunk Exceptions: cervical sx pending   Communication Communication Communication: No difficulties   Cognition Arousal/Alertness: Awake/alert Behavior During Therapy: Impulsive;Restless Overall Cognitive Status: Impaired/Different from baseline Area of Impairment: Following commands;Safety/judgement                       Following Commands: Follows multi-step commands inconsistently Safety/Judgement: Decreased awareness of safety;Decreased awareness of deficits     General Comments: Pt impulsive throughout session - required assist with line management - determined to do things on his own but with NO  SAFETY AWARENESS   General Comments  wife, mother, and sister in room. Pt with incontinence during session - unable to access urinal in time.     Exercises     Shoulder Instructions      Home Living Family/patient expects to be discharged to:: Private residence Living Arrangements: Spouse/significant other Available Help at Discharge: Family;Available 24 hours/day Type of Home: Apartment Home Access: Stairs to enter [employer] of Steps: two and half flights Entrance Stairs-Rails: Right;Left Home Layout: One level     Bathroom Shower/Tub: Chief Strategy Officer: Standard     Home Equipment: Crutches          Prior Functioning/Environment Level of Independence: Independent with assistive device(s)        Comments: using crutches for the past 2 weeks        OT Problem List: Decreased strength;Decreased range of motion;Decreased activity tolerance;Impaired balance (sitting and/or standing);Decreased coordination;Decreased safety awareness;Decreased knowledge of use of DME or AE;Impaired UE functional use      OT Treatment/Interventions: Self-care/ADL training;Therapeutic exercise;Neuromuscular education;Energy conservation;DME and/or AE instruction;Manual therapy;Therapeutic activities;Cognitive remediation/compensation;Patient/family education;Balance training    OT Goals(Current goals can be found in the care plan section) Acute Rehab OT Goals Patient Stated Goal: to get better OT Goal Formulation: With patient/family Time For Goal Achievement: 02/20/18 Potential to Achieve Goals: Good  OT Frequency: Min 2X/week   Barriers to D/C:            Co-evaluation  AM-PAC PT "6 Clicks" Daily Activity     Outcome Measure Help from another person eating meals?: A Little Help from another person taking care of personal grooming?: A Little Help from another person toileting, which includes using toliet, bedpan, or urinal?: A  Little Help from another person bathing (including washing, rinsing, drying)?: A Little Help from another person to put on and taking off regular upper body clothing?: A Little Help from another person to put on and taking off regular lower body clothing?: A Lot 6 Click Score: 17   End of Session    Activity Tolerance:   Patient left:    OT Visit Diagnosis: Unsteadiness on feet (R26.81);Other abnormalities of gait and mobility (R26.89);History of falling (Z91.81);Other symptoms and signs involving cognitive function;Hemiplegia and hemiparesis Hemiplegia - Right/Left: Left Hemiplegia - dominant/non-dominant: Non-Dominant Hemiplegia - caused by: Unspecified                Time: 1623-1710 OT Time Calculation (min): 47 min Charges:  OT General Charges $OT Visit: 1 Visit OT Evaluation $OT Eval Moderate Complexity: 1 Mod G-Codes:     Sherryl Manges OTR/L [phone]  Evern Bio Acea Yagi 02/06/2018, 5:44 PM

## 2018-02-06 NOTE — Progress Notes (Signed)
Physical Therapy Treatment Patient Details Name: David Keith MRN: [id number] DOB: [birth year] Today's Date: 02/06/2018    History of Present Illness 62 year old male who presented with difficulty walking and left upper extremity weakness.  He does have the significant past medical history for hypertension, tobacco abuse and alcohol abuse.  Patient reported a 3-day history of left sided weakness with worsening ambulatory dysfunction to the point where he is not able to ambulate.  The weakness has been associated with paresthesias on the left side.  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    PT Comments    Patient progressing with ambulation distance/endurance, though still with L LE fatigue, buckling and significant ataxia.  Feel continued skilled PT in the acute setting indicated prior to d/c to CIR level rehab.   Follow Up Recommendations  CIR     Equipment Recommendations  Rolling walker with 5" wheels    Recommendations for Other Services       Precautions / Restrictions Precautions Precautions: Fall    Mobility  Bed Mobility               General bed mobility comments: up in recliner  Transfers Overall transfer level: Needs assistance Equipment used: Rolling walker (2 wheeled) Transfers: Sit to/from Stand Sit to Stand: Min assist;Min guard         General transfer comment: heavily relies on UE support at times, when less support lands back on seat and/or braces backs of knees on edge of chair, cues for hand placement  Ambulation/Gait Ambulation/Gait assistance: Mod assist;Max assist Ambulation Distance (Feet): 120 Feet Assistive device: Rolling walker (2 wheeled) Gait Pattern/deviations: Step-through pattern;Step-to pattern;Trunk flexed;Ataxic;Narrow base of  support;Shuffle;Decreased stride length     General Gait Details: initially upright posture, but quickly degrades with flexed head, trunk and hips, needs assist for walker control, cues for decreased speed, assist for safety with turns and stand to sit   Stairs             Wheelchair Mobility    Modified Rankin (Stroke Patients Only)       Balance Overall balance assessment: Needs assistance   Sitting balance-Leahy Scale: Good Sitting balance - Comments: bends to floor to don paper scrubs   Standing balance support: No upper extremity supported Standing balance-Leahy Scale: Poor Standing balance comment: bracing knees against chair to pull up pants with minguard to min A                            Cognition Arousal/Alertness: Awake/alert Behavior During Therapy: WFL for tasks assessed/performed Overall Cognitive Status: Within Functional Limits for tasks assessed                                        Exercises Other Exercises Other Exercises: seated ankle DF/PF, marching, sit to stand x 5, standing stepping in and out with alternate feet with UE support    General Comments        Pertinent Vitals/Pain Pain Assessment: 0-10 Pain Score: 3  Pain Location: L hip  Pain Descriptors / Indicators: Aching;Sore Pain Intervention(s): Monitored during session;Repositioned    Home Living  Prior Function            PT Goals (current goals can now be found in the care plan section) Progress towards PT goals: Progressing toward goals    Frequency    Min 3X/week      PT Plan Current plan remains appropriate    Co-evaluation              AM-PAC PT "6 Clicks" Daily Activity  Outcome Measure  Difficulty turning over in bed (including adjusting bedclothes, sheets and blankets)?: A Little Difficulty moving from lying on back to sitting on the side of the bed? : A Lot Difficulty sitting down on and  standing up from a chair with arms (e.g., wheelchair, bedside commode, etc,.)?: Unable Help needed moving to and from a bed to chair (including a wheelchair)?: A Lot Help needed walking in hospital room?: A Lot Help needed climbing 3-5 steps with a railing? : Total 6 Click Score: 11    End of Session Equipment Utilized During Treatment: Gait belt Activity Tolerance: Patient tolerated treatment well Patient left: with call bell/phone within reach;in chair;with family/visitor present   PT Visit Diagnosis: Unsteadiness on feet (R26.81);Other abnormalities of gait and mobility (R26.89);Other symptoms and signs involving the nervous system (R29.898);History of falling (Z91.81)     Time: 1191-4782 PT Time Calculation (min) (ACUTE ONLY): 29 min  Charges:  $Gait Training: 8-22 mins $Therapeutic Activity: 8-22 mins                    G CodesSheran Keith, [place] [phone] 02/06/2018    David Keith 02/06/2018, 1:33 PM

## 2018-02-06 NOTE — Progress Notes (Signed)
Noted surgical recommendations. RN CM, Harvin Hazel, requesting follow up. If patient would like to be considered for CIR admit, please place order for rehab consult. [phone]

## 2018-02-06 NOTE — Discharge Summary (Addendum)
Physician Discharge Summary  David Keith XBJ:[id number] DOB: Dec 28, 1955 DOA: 02/04/2018  PCP: David Maroon, FNP  Admit date: 02/04/2018 Discharge date: 02/07/2018  Admitted From: Home Disposition: Home  Recommendations for Outpatient Follow-up:  1. Follow up with PCP in 1 week 2. Follow up with Dr. Lovell Keith, Neurosurgery, as an outpatient 3. Please obtain BMP/CBC in one week 4. Please follow up on the following pending results: None  Home Health: PT/OT Equipment/Devices: Rolling walker  Discharge Condition: Stable CODE STATUS: Full code Diet recommendation: Heart healthy   Brief/Interim Summary:  Admission HPI written by David Clos, MD   Chief Complaint: Difficulty walking and left upper extremity weakness.  HPI: David Keith is a 62 y.o. male with history of hypertension, tobacco abuse, alcohol abuse presents to the ER because of persistent weakness of the left upper extremity and difficulty walking.  Patient symptoms started 3 days ago.  At the time patient also had left hip pain.  Patient came to the ER and x-rays of the hip did not show anything acute and was discharged home.  Patient continued to have weakness of the left upper extremity and more difficulty in walking due to ataxia.  Denies any difficulty swallowing speaking or any weakness in the right side.  Patient admits to drinking alcohol every day.  ED Course: In the ER on exam patient has ataxic gait and needs help to walk.  Patient also has dysdiadochokinesia of the left upper extremity and strength of the left approximate he is 4 x 5.  Distal extremities are 5 x 5.  There is mild swelling of the right knee which patient states has been ongoing for last few weeks.  There is also swelling of the both lower extremities.   Hospital course:  Ataxia Upper extremity weakness Initial concern for stroke.  MRI head was obtained which is negative for stroke.  MRI cervical spine was significant  for moderate size disc herniation resulting in spinal stenosis with cord mass-effect in addition to C4 foraminal stenosis.  Neurosurgery was consulted and recommended outpatient surgical management as this is likely chronic.  Physical therapy was consulted and recommended inpatient rehab, however, this may be more beneficial for after his surgery.  Essential hypertension Continue amlodipine  Ethanol abuse No evidence of withdrawal.  CIWA while inpatient.  Tobacco abuse Smoking cessation discussed.  Prevertebral/retropharyngeal fluid collection Extending from C1-C5.  Thought secondary to reactive effusion.  No evidence of inflammation.  Unlikely to be infectious related.    Discharge Diagnoses:  Principal Problem:   Ataxia Active Problems:   Hypertension   Hepatitis C   Weakness   Benign essential HTN   Tobacco abuse   ETOH abuse   Chronic back pain   Hypokalemia   Acute blood loss anemia    Discharge Instructions   Allergies as of 02/06/2018      Reactions   Flexeril [cyclobenzaprine] Other (See Comments)   Gives him weird thoughts   Other    NO BLOOD PRODUCTS. (pt will accept Albumin)      Medication List    STOP taking these medications   ibuprofen 200 MG tablet Commonly known as:  ADVIL,MOTRIN     TAKE these medications   amLODipine 10 MG tablet Commonly known as:  NORVASC TAKE 1 TABLET BY MOUTH EVERY DAY. What changed:    how much to take  how to take this  when to take this  additional instructions   diclofenac sodium 1 %  Gel Commonly known as:  VOLTAREN Apply 2 g topically 4 (four) times daily. What changed:    when to take this  reasons to take this   HYDROcodone-acetaminophen 5-325 MG tablet Commonly known as:  NORCO/VICODIN Take 1-2 tablets by mouth every 4 (four) hours as needed for moderate pain.   meloxicam 7.5 MG tablet Commonly known as:  MOBIC Take 1 tablet (7.5 mg total) by mouth daily.            Durable Medical  Equipment  (From admission, onward)        Start     Ordered   02/06/18 1415  For home use only DME Walker rolling  Once    Question:  Patient needs a walker to treat with the following condition  Answer:  Upper extremity weakness   02/06/18 1415      Allergies  Allergen Reactions  . Flexeril [Cyclobenzaprine] Other (See Comments)    Gives him weird thoughts  . Other     NO BLOOD PRODUCTS. (pt will accept Albumin)    Consultations:  Neurosurgery   Procedures/Studies: Ct Angio Head W Or Wo Contrast  Result Date: 02/05/2018 CLINICAL DATA:  Stroke follow-up.  Left-sided weakness EXAM: CT ANGIOGRAPHY HEAD AND NECK TECHNIQUE: Multidetector CT imaging of the head and neck was performed using the standard protocol during bolus administration of intravenous contrast. Multiplanar CT image reconstructions and MIPs were obtained to evaluate the vascular anatomy. Carotid stenosis measurements (when applicable) are obtained utilizing NASCET criteria, using the distal internal carotid diameter as the denominator. CONTRAST:  50mL ISOVUE-370 IOPAMIDOL (ISOVUE-370) INJECTION 76% COMPARISON:  Head CT from yesterday.  Brain MRI from earlier today FINDINGS: CTA NECK FINDINGS Aortic arch: Atherosclerotic calcification. Two vessel branching pattern. Right carotid system: Mild calcified plaque at the ICA bulb. No stenosis or ulceration. Left carotid system: Mild to moderate atherosclerotic plaque at the common carotid bifurcation. No stenosis or ulceration. Vertebral arteries: No proximal subclavian stenosis. There aberrant right vertebral artery arising from the arch. The left vertebral artery is dominant. No vertebral stenosis. Skeleton: Cervical disc and facet degeneration. There is a visible downward migrating disc protrusion at C3-4, impinging on the cord. Retropharyngeal effusion extending from C1 to C5. No posterior longitudinal ligament ossification. No visible osteomyelitis. Other neck: Retropharyngeal  effusion as noted above. No visible pharyngeal or laryngeal inflammation. Calcification in the lower left maxillary sinus, likely calcified retention cyst or radicular cyst. Upper chest: Probable airway thickening. Minimal emphysematous change. Review of the MIP images confirms the above findings CTA HEAD FINDINGS Anterior circulation: Atherosclerotic plaque on the carotid siphons without flow limiting stenosis. No branch occlusion, beading, or aneurysm. Posterior circulation: Strong left vertebral artery dominance. The right vertebral artery ends in PICA. Vertebral and basilar arteries are smooth and diffusely patent. Large posterior communicating arteries. Robust and symmetric bilateral PCA flow Venous sinuses: Patent Anatomic variants: None unusual. Incomplete circle-of-Willis at the anterior communicating artery Delayed phase: No abnormal intracranial enhancement. A call has been placed to the hospitalist service. Review of the MIP images confirms the above findings IMPRESSION: 1. No emergent vascular finding. There is atherosclerosis without flow limiting stenosis or ulceration. 2. Retropharyngeal effusion without visible mucosal or spinal source. This could be a manifestation of spinal infection in this patient with neurologic deficits. Suggest MRI the cervical spine with contrast. 3. 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 Electronically Signed   By: Marnee Spring M.D.   On: 02/05/2018 08:34  Dg Chest 1 View  Result Date: 02/05/2018 CLINICAL DATA:  Weakness EXAM: CHEST  1 VIEW COMPARISON:  02/03/2018 FINDINGS: Heart and mediastinal contours are within normal limits. No focal opacities or effusions. No acute bony abnormality. IMPRESSION: No active disease. Electronically Signed   By: Charlett Nose M.D.   On: 02/05/2018 09:03   Dg Ribs Unilateral W/chest Left  Result Date: 02/03/2018 CLINICAL DATA:  Weakness status post fall 4 weeks ago. Patient had a prior  diagnosis a rib fracture. EXAM: LEFT RIBS AND CHEST - 3+ VIEW COMPARISON:  December 04 2017 FINDINGS: There is healing fracture of the left lateral seventh rib. No new rib fracture is noted. There is no evidence of pneumothorax or pleural effusion. Both lungs are clear. Heart size and mediastinal contours are within normal limits. IMPRESSION: Healing fracture of the lateral left seventh rib. Electronically Signed   By: Sherian Rein M.D.   On: 02/03/2018 07:55   Dg Knee 1-2 Views Right  Result Date: 02/05/2018 CLINICAL DATA:  Chronic pain EXAM: RIGHT KNEE - 1-2 VIEW COMPARISON:  Right knee radiographs April 13, 2007 and right knee MRI April 19, 2007 FINDINGS: Frontal and lateral views were obtained. No acute fracture or dislocation. There is a sizable joint effusion. There is marked osteoarthritic change diffusely with essentially complete loss of joint space medially as well as marked narrowing in the patellofemoral joint region. There is generalized bony overgrowth. IMPRESSION: Advanced arthropathy with marked progression from prior studies. There is a sizable joint effusion. There is prominent spurring in all compartments. There is sclerosis and essentially complete joint space loss medially. Marked spurring and marked narrowing in the patellofemoral joint region noted. No acute fracture or dislocation evident. Electronically Signed   By: Bretta Bang III M.D.   On: 02/05/2018 09:07   Ct Head Wo Contrast  Result Date: 02/04/2018 CLINICAL DATA:  Left-sided weakness EXAM: CT HEAD WITHOUT CONTRAST TECHNIQUE: Contiguous axial images were obtained from the base of the skull through the vertex without intravenous contrast. COMPARISON:  None. FINDINGS: Brain: No mass lesion, intraparenchymal hemorrhage or extra-axial collection. No evidence of acute cortical infarct. Normal appearance of the brain parenchyma and extra axial spaces for age. Vascular: No hyperdense vessel or unexpected vascular calcification.  Skull: Normal visualized skull base, calvarium and extracranial soft tissues. Sinuses/Orbits: Mineralized area within the left maxillary sinus, without thickening of the maxillary sinus walls. No sinus mucosal abnormality. Normal orbits. IMPRESSION: 1. Normal brain. 2. 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 Electronically Signed   By: Deatra Robinson M.D.   On: 02/04/2018 21:01   Ct Soft Tissue Neck W Contrast  Result Date: 02/06/2018 CLINICAL DATA:  62 y/o M; retropharyngeal/prevertebral fluid collection. EXAM: CT NECK WITH CONTRAST TECHNIQUE: Multidetector CT imaging of the neck was performed using the standard protocol following the bolus administration of intravenous contrast. CONTRAST:  75mL OMNIPAQUE IOHEXOL 300 MG/ML  SOLN COMPARISON:  02/05/2018 MRI cervical spine and CT angiogram of the neck. FINDINGS: Pharynx and larynx: Normal. No mass or swelling. Salivary glands: No inflammation, mass, or stone. Thyroid: Normal. Lymph nodes: None enlarged or abnormal density. Vascular: Mild calcific atherosclerosis of the aortic arch and the bilateral carotid bifurcations. Limited intracranial: Negative. Visualized orbits: Negative. Mastoids and visualized paranasal sinuses: There is a dome-shaped bony structure extending from the left maxillary alveolar bone in the oral cavity into the left maxillary sinus antrum which may represent a chronically calcified  oral antral fistula or dental cavity. Skeleton: Multilevel degenerative changes of the cervical spine greatest at the C3-4 level for a disc herniation results in canal stenosis better characterized on the prior cervical MRI. Prevertebral fluid collection extending from the C1 to the C5 vertebral body (series 7, image 52). There is no rim enhancement of the prevertebral fluid collection and there is no appreciable inflammatory change in the surrounding deep cervical  compartments. Upper chest: Negative. Other: None. IMPRESSION: 1. Again seen is a prevertebral/retropharyngeal fluid collection extending from C1-C5. No peripheral enhancement of the fluid collection and no inflammation of surrounding deep cervical compartments is identified. The fluid collection is probably a reactive effusion possibly related to adenoid tonsillitis, longus coli tendinitis, or possibly sequelae of trauma as discussed on prior studies. Abscess is unlikely. 2. Mild calcific atherosclerosis of the aorta and bilateral carotid bifurcations. 3. Spondylosis of the cervical spine greatest at C3-4 where a disc herniation results in canal stenosis. Electronically Signed   By: Mitzi Hansen M.D.   On: 02/06/2018 02:15   Ct Angio Neck W Or Wo Contrast  Result Date: 02/05/2018 CLINICAL DATA:  Stroke follow-up.  Left-sided weakness EXAM: CT ANGIOGRAPHY HEAD AND NECK TECHNIQUE: Multidetector CT imaging of the head and neck was performed using the standard protocol during bolus administration of intravenous contrast. Multiplanar CT image reconstructions and MIPs were obtained to evaluate the vascular anatomy. Carotid stenosis measurements (when applicable) are obtained utilizing NASCET criteria, using the distal internal carotid diameter as the denominator. CONTRAST:  50mL ISOVUE-370 IOPAMIDOL (ISOVUE-370) INJECTION 76% COMPARISON:  Head CT from yesterday.  Brain MRI from earlier today FINDINGS: CTA NECK FINDINGS Aortic arch: Atherosclerotic calcification. Two vessel branching pattern. Right carotid system: Mild calcified plaque at the ICA bulb. No stenosis or ulceration. Left carotid system: Mild to moderate atherosclerotic plaque at the common carotid bifurcation. No stenosis or ulceration. Vertebral arteries: No proximal subclavian stenosis. There aberrant right vertebral artery arising from the arch. The left vertebral artery is dominant. No vertebral stenosis. Skeleton: Cervical disc and facet  degeneration. There is a visible downward migrating disc protrusion at C3-4, impinging on the cord. Retropharyngeal effusion extending from C1 to C5. No posterior longitudinal ligament ossification. No visible osteomyelitis. Other neck: Retropharyngeal effusion as noted above. No visible pharyngeal or laryngeal inflammation. Calcification in the lower left maxillary sinus, likely calcified retention cyst or radicular cyst. Upper chest: Probable airway thickening. Minimal emphysematous change. Review of the MIP images confirms the above findings CTA HEAD FINDINGS Anterior circulation: Atherosclerotic plaque on the carotid siphons without flow limiting stenosis. No branch occlusion, beading, or aneurysm. Posterior circulation: Strong left vertebral artery dominance. The right vertebral artery ends in PICA. Vertebral and basilar arteries are smooth and diffusely patent. Large posterior communicating arteries. Robust and symmetric bilateral PCA flow Venous sinuses: Patent Anatomic variants: None unusual. Incomplete circle-of-Willis at the anterior communicating artery Delayed phase: No abnormal intracranial enhancement. A call has been placed to the hospitalist service. Review of the MIP images confirms the above findings IMPRESSION: 1. No emergent vascular finding. There is atherosclerosis without flow limiting stenosis or ulceration. 2. Retropharyngeal effusion without visible mucosal or spinal source. This could be a manifestation of spinal infection in this patient with neurologic deficits. Suggest MRI the cervical spine with contrast. 3. 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 Electronically Signed   By: Marnee Spring M.D.   On: 02/05/2018 08:34   Mr Brain Wo Contrast  Result Date: 02/05/2018 CLINICAL  DATA:  Left-sided weakness EXAM: MRI HEAD WITHOUT CONTRAST TECHNIQUE: Multiplanar, multiecho pulse sequences of the brain and surrounding structures were obtained without  intravenous contrast. COMPARISON:  Head CT from yesterday FINDINGS: Brain: No acute infarction, hemorrhage, hydrocephalus, extra-axial collection or mass lesion. Mild for age FLAIR hyperintensity in the cerebral white matter Vascular: Major flow voids are preserved Skull and upper cervical spine: Normal marrow signal Sinuses/Orbits: Polypoid density in the inferior left maxillary sinus that is calcified by CT, likely calcified retention cyst. IMPRESSION: Negative for infarct.  No explanation for symptoms. Electronically Signed   By: Marnee Spring M.D.   On: 02/05/2018 07:43   Mr Cervical Spine W Wo Contrast  Addendum Date: 02/05/2018   ADDENDUM REPORT: 02/05/2018 11:55 ADDENDUM: Study discussed by telephone with Dr. Erin Hearing on 02/05/2018 at 1147 hours. One possibility we discussed regarding the indeterminate abnormal retropharyngeal space (#1) is recent trauma, such as a fall with anterior cervical ligamentous injury. In that hypothetical scenario, the disc herniation resulting in stenosis affecting the spinal cord with signal abnormality at C3-C4 (#2) might also be acute/recent. Electronically Signed   By: Odessa Fleming M.D.   On: 02/05/2018 11:55   Result Date: 02/05/2018 CLINICAL DATA:  62 year old male with left upper extremity weakness, difficulty walking. Abnormal retropharyngeal space on CTA head and neck earlier today. EXAM: MRI CERVICAL SPINE WITHOUT AND WITH CONTRAST TECHNIQUE: Multiplanar and multiecho pulse sequences of the cervical spine, to include the craniocervical junction and cervicothoracic junction, were obtained without and with intravenous contrast. CONTRAST:  14mL MULTIHANCE GADOBENATE DIMEGLUMINE 529 MG/ML IV SOLN COMPARISON:  CTA head and neck 0817 hours. Brain MRI 0655 hours today. FINDINGS: Alignment: Stable straightening of cervical lordosis as seen on the CTA today. Vertebrae: Heterogeneous bone marrow signal throughout the cervical spine, although no convincing marrow edema  (subtle C6 and C7 increased vertebral body STIR signal is not correlated with decreased precontrast T1 bone marrow. Cord: Evidence of abnormal increased T2 and STIR signal within the spinal cord at C3-C4 corresponding to compression due to disc herniation and spinal stenosis. See additional details of that level below. No cord signal abnormality above or below the C3-C4 level. No definite abnormal intradural enhancement. No dural thickening. Posterior Fossa, vertebral arteries, paraspinal tissues: Cervicomedullary junction is within normal limits. Negative visible brain parenchyma. Confluent abnormal fluid or edema signal in the retropharyngeal or prevertebral soft tissue space re-demonstrated (series 2, image 9, series 5, image 24), and measures up to 8 millimeters in thickness (series 5, image 19). However, postcontrast images demonstrate some intermediate signal rather than low signal in the epicenter of this finding (series 10, image 19) suggesting this may be edema rather than overt fluid. At the same time the adjacent longus coli muscles remain normal. There is no pharyngeal abnormality evident. Furthermore, although the underlying C3-C4 and C4-C5 discs are degenerated, there is no enhancement or inflammation identified within the disc spaces, and only trace degenerative appearing enhancement at the C3 anterior inferior endplate. There is no other signal abnormality in the cervical spine ligamentous complex. The other neck soft tissues appear negative. Negative visible lung apices. Disc levels: C2-C3: Disc bulge and endplate spurring plus right greater than left facet hypertrophy without significant spinal stenosis. There is mild to moderate right C3 foraminal stenosis. C3-C4: Circumferential disc bulge with superimposed moderate size disc extrusion or protrusion (see series 7, image 17) resulting in mild to moderate spinal stenosis and cord mass effect with signal abnormality suspected in the cord as stated  above. Right greater than left foraminal involvement and superimposed facet hypertrophy. Mild to moderate left and moderate to severe right C4 foraminal stenosis. C4-C5: Circumferential disc osteophyte complex with moderate to severe right facet hypertrophy. No significant spinal stenosis. Moderate to severe right C5 foraminal stenosis. C5-C6: Disc space loss with circumferential disc osteophyte complex. Broad-based posterior component with mild spinal stenosis and spinal cord mass effect. No cord signal abnormality. Severe left and moderate to severe right C6 foraminal stenosis. C6-C7: Disc space loss with circumferential disc osteophyte complex. Mild ligament flavum hypertrophy. Spinal stenosis with up to mild spinal cord mass effect. Severe bilateral C7 foraminal stenosis. C7-T1: Circumferential disc osteophyte complex with broad-based posterior and biforaminal involvement. Mild facet hypertrophy. Mild spinal stenosis and possible spinal cord mass effect. Severe bilateral C8 foraminal stenosis. No upper thoracic spinal stenosis or cord signal abnormality. IMPRESSION: 1. Prominent fluid and/or edema in the retropharyngeal or prevertebral space tracking from the C1 to the C5 level as seen on CTA neck earlier today. This is nonspecific. There is NO strong evidence of underlying cervical discitis/osteomyelitis. The underlying longus coli muscles remain normal, and also the patient also lacks the typical calcifications of hydroxyapatite deposition disease on the CTA today, arguing against acute prevertebral/retropharyngeal tinnitus. 2. However, the study is positive for degenerative spinal stenosis at C3-C4 in part due to a moderate size disc herniation in the midline (series 7, image 17), resulting in spinal stenosis with cord mass effect and abnormal cord signal. Associated moderate to severe right greater than left C4 foraminal stenosis. 3. Advanced Cervical spine degeneration also C5-C6 through C7-T1 with  additional spinal stenosis, severe bilateral foraminal stenosis, and up to mild spinal cord mass effect, but no additional cord signal abnormality. Electronically Signed: By: Odessa Fleming M.D. On: 02/05/2018 11:43   Dg Hip Unilat W Or Wo Pelvis 2-3 Views Left  Result Date: 02/03/2018 CLINICAL DATA:  Status post fall 4 weeks ago with left hip pain. EXAM: DG HIP (WITH OR WITHOUT PELVIS) 2-3V LEFT COMPARISON:  None. FINDINGS: There is no evidence of hip fracture or dislocation. Patient status post prior surgery of the lumbar spine. IMPRESSION: No acute fracture or dislocation. Electronically Signed   By: Sherian Rein M.D.   On: 02/03/2018 07:56      Subjective: Patient still with some weakness.  Discharge Exam: Vitals:   02/06/18 1246 02/06/18 1607  BP: (!) 138/91 (!) 135/95  Pulse: 71 72  Resp: 20 16  Temp: 97.9 F (36.6 C) 97.7 F (36.5 C)  SpO2: 100% 100%   Vitals:   02/06/18 0521 02/06/18 0913 02/06/18 1246 02/06/18 1607  BP: (!) 141/74 (!) 147/92 (!) 138/91 (!) 135/95  Pulse: 66 71 71 72  Resp: 18 20 20 16   Temp: 97.8 F (36.6 C) 98.2 F (36.8 C) 97.9 F (36.6 C) 97.7 F (36.5 C)  TempSrc: Oral Oral Oral Oral  SpO2: 97% 95% 100% 100%  Weight:      Height:        General: Pt is alert, awake, not in acute distress Cardiovascular: RRR, S1/S2 +, no rubs, no gallops Respiratory: CTA bilaterally, no wheezing, no rhonchi Abdominal: Soft, NT, ND, bowel sounds + Extremities: no edema, no cyanosis    The results of significant diagnostics from this hospitalization (including imaging, microbiology, ancillary and laboratory) are listed below for reference.     Microbiology: No results found for this or any previous visit (from the past 240 hour(s)).   Labs: BNP (last 3 results)  No results for input(s): BNP in the last 8760 hours. Basic Metabolic Panel: Recent Labs  Lab 02/03/18 0700 02/04/18 2016 02/04/18 2026 02/05/18 0953 02/06/18 0502  NA 143 141 142  --  142  K  3.9 3.9 3.9  --  3.4*  CL 112* 105 105  --  108  CO2 20* 27  --   --  26  GLUCOSE 93 105* 104*  --  104*  BUN --  9  CREATININE 0.80 0.87 0.80 0.79 0.76  CALCIUM 8.8* 9.3  --   --  8.6*   Liver Function Tests: Recent Labs  Lab 02/03/18 0700 02/04/18 2016 02/06/18 0502  AST 38 41 31  ALT ALKPHOS 68 68 58  BILITOT 0.5 0.5 0.7  PROT 6.6 6.8 5.8*  ALBUMIN 3.9 4.1 3.1*   No results for input(s): LIPASE, AMYLASE in the last 168 hours. No results for input(s): AMMONIA in the last 168 hours. CBC: Recent Labs  Lab 02/03/18 0700 02/04/18 2016 02/04/18 2026 02/05/18 0953 02/06/18 0502  WBC 5.0 5.4  --  3.8* 3.4*  NEUTROABS 2.9 3.7  --   --   --   HGB 12.9* 13.0 15.0 13.2 12.4*  HCT 37.5* 37.8* 44.0 39.1  39.3 36.6*  MCV 88.4 88.9  --  88.5 88.0  PLT 208 204  --  171 189   Cardiac Enzymes: No results for input(s): CKTOTAL, CKMB, CKMBINDEX, TROPONINI in the last 168 hours. BNP: Invalid input(s): POCBNP CBG: No results for input(s): GLUCAP in the last 168 hours. D-Dimer No results for input(s): DDIMER in the last 72 hours. Hgb A1c Recent Labs    02/05/18 0953  HGBA1C 5.1   Lipid Profile Recent Labs    02/05/18 0953  CHOL 163  HDL 68  LDLCALC 79  TRIG 81  CHOLHDL 2.4   Thyroid function studies Recent Labs    02/05/18 0954  TSH 1.714   Anemia work up Recent Labs    02/05/18 0953  VITAMINB12 406   Urinalysis    Component Value Date/Time   COLORURINE YELLOW 02/03/2018 0913   APPEARANCEUR CLEAR 02/03/2018 0913   LABSPEC 1.012 02/03/2018 0913   PHURINE 5.0 02/03/2018 0913   GLUCOSEU NEGATIVE 02/03/2018 0913   HGBUR NEGATIVE 02/03/2018 0913   BILIRUBINUR NEGATIVE 02/03/2018 0913   BILIRUBINUR neg 01/04/2012 0906   KETONESUR NEGATIVE 02/03/2018 0913   PROTEINUR NEGATIVE 02/03/2018 0913   UROBILINOGEN 0.2 08/03/2017 1133   NITRITE NEGATIVE 02/03/2018 0913   LEUKOCYTESUR NEGATIVE 02/03/2018 0913     SIGNED:   Jacquelin Hawking,  MD [facility] 02/06/2018, 4:50 PM

## 2018-02-06 NOTE — Consult Note (Signed)
Physical Medicine and Rehabilitation Consult Reason for Consult: Left-sided weakness decreased functional mobility Referring Physician: [facility]   HPI: David Keith is a 62 y.o. right-handed male with history of hypertension, tobacco and alcohol abuse as well as chronic back pain.  History taken from chart review.  Patient playing candy crush throughout evaluation with limited eye contact.  Per chart review patient lives with spouse.  One level apartment with multiple steps to the entry.  Patient was using crutches prior to admission.  Family assistance as needed.  Presented 02/05/2018 with persistent weakness of left upper extremity and ataxic gait x3 days.  He also notes he was at a party 1 week prior and had several drink and was unable to ambulate after that. Patient did recently present to the ED x-rays of the hip were completed unremarkable patient discharged home.  Cranial CT scan 02/04/2018 unremarkable as well as MRI, reviewed, unremarkable for acute intracranial process. CT angiogram of head and neck with no emergent vascular findings.  MRI of the cervical spine that showed moderate spinal stenosis at C3-4 with evidence of spinal cord signal abnormality.  Multilevel spondylosis and degenerative changes.  Neurosurgery Dr. Lovell Sheehan follow-up and no plan for surgical intervention at this time.  Subcutaneous Lovenox for DVT prophylaxis.  Physical therapy evaluation completed 02/05/2018 with recommendations of physical medicine rehab consult.   Review of Systems  Constitutional: Negative for chills and fever.  HENT: Negative for hearing loss.   Eyes: Negative for blurred vision and double vision.  Respiratory: Negative for cough and shortness of breath.   Cardiovascular: Positive for leg swelling. Negative for chest pain and palpitations.  Gastrointestinal: Positive for constipation. Negative for nausea and vomiting.  Genitourinary: Positive for urgency. Negative for flank pain and  hematuria.  Musculoskeletal: Positive for back pain and joint pain.  Skin: Negative for rash.  Neurological: Positive for sensory change, focal weakness and weakness.  All other systems reviewed and are negative.  Past Medical History:  Diagnosis Date  . Chronic back pain    radiculopathy and stenosis  . Hypertension    takes AMlodipine daily  . Joint swelling    right knee  . Weakness    numbness in both legs when walking   Past Surgical History:  Procedure Laterality Date  . COLONOSCOPY    . KNEE ARTHROSCOPY Right 2011   Family History  Problem Relation Age of Onset  . Cancer Mother   . Heart disease Father   . Diabetes Brother    Social History:  reports that he has been smoking cigarettes.  He has a 10.00 pack-year smoking history. He has never used smokeless tobacco. He reports that he drinks about 1.2 oz of alcohol per week. He reports that he has current or past drug history. Drug: Marijuana. Allergies:  Allergies  Allergen Reactions  . Flexeril [Cyclobenzaprine] Other (See Comments)    Gives him weird thoughts  . Other     NO BLOOD PRODUCTS. (pt will accept Albumin)   Medications Prior to Admission  Medication Sig Dispense Refill  . amLODipine (NORVASC) 10 MG tablet TAKE 1 TABLET BY MOUTH EVERY DAY. (Patient taking differently: Take 10 mg by mouth daily. ) 30 tablet 5  . diclofenac sodium (VOLTAREN) 1 % GEL Apply 2 g topically 4 (four) times daily. (Patient taking differently: Apply 2 g topically 4 (four) times daily as needed (pain). ) 1 Tube 2  . HYDROcodone-acetaminophen (NORCO/VICODIN) 5-325 MG tablet Take 1-2 tablets by  mouth every 4 (four) hours as needed for moderate pain. 40 tablet 0  . ibuprofen (ADVIL,MOTRIN) 200 MG tablet Take 400 mg by mouth every 6 (six) hours as needed for moderate pain.    . meloxicam (MOBIC) 7.5 MG tablet Take 1 tablet (7.5 mg total) by mouth daily. 30 tablet 2    Home: Home Living Family/patient expects to be discharged to::  Private residence Living Arrangements: Alone Available Help at Discharge: Family, Available 24 hours/day Type of Home: Apartment Home Access: Stairs to enter [employer] of Steps: two and half flights Entrance Stairs-Rails: Right, Left Home Layout: One level Bathroom Toilet: Standard Home Equipment: Crutches  Functional History: Prior Function Level of Independence: Independent with assistive device(s) Comments: using crutches Functional Status:  Mobility: Bed Mobility Overal bed mobility: Needs Assistance Bed Mobility: Supine to Sit, Sit to Supine Supine to sit: HOB elevated, Min guard Sit to supine: Min assist, HOB elevated General bed mobility comments: up in recliner Transfers Overall transfer level: Needs assistance Equipment used: Rolling walker (2 wheeled) Transfers: Sit to/from Stand Sit to Stand: Min assist, Min guard General transfer comment: heavily relies on UE support at times, when less support lands back on seat and/or braces backs of knees on edge of chair, cues for hand placement Ambulation/Gait Ambulation/Gait assistance: Mod assist, Max assist Ambulation Distance (Feet): 120 Feet Assistive device: Rolling walker (2 wheeled) Gait Pattern/deviations: Step-through pattern, Step-to pattern, Trunk flexed, Ataxic, Narrow base of support, Shuffle, Decreased stride length General Gait Details: initially upright posture, but quickly degrades with flexed head, trunk and hips, needs assist for walker control, cues for decreased speed, assist for safety with turns and stand to sit    ADL:    Cognition: Cognition Overall Cognitive Status: Within Functional Limits for tasks assessed Orientation Level: Oriented X4 Cognition Arousal/Alertness: Awake/alert Behavior During Therapy: WFL for tasks assessed/performed Overall Cognitive Status: Within Functional Limits for tasks assessed  Blood pressure (!) 138/91, pulse 71, temperature 97.9 F (36.6 C),  temperature source Oral, resp. rate 20, height  (1.753 m), weight 66.2 kg (146 lb), SpO2 100 %. Physical Exam  Vitals reviewed. Constitutional: He is oriented to person, place, and time. He appears well-developed.  Frail  HENT:  Head: Normocephalic and atraumatic.  Eyes: EOM are normal. Right eye exhibits no discharge. Left eye exhibits no discharge. Scleral icterus is present.  Neck: Normal range of motion. Neck supple. No thyromegaly present.  Cardiovascular: Normal rate, regular rhythm and normal heart sounds.  Respiratory: Effort normal and breath sounds normal. No respiratory distress.  GI: Soft. Bowel sounds are normal. He exhibits no distension.  Musculoskeletal:  No edema or tenderness in extremities Left hand intrinsic atrophy  Neurological: He is alert and oriented to person, place, and time.  Motor: RUE: 4+/5 proximal to distal LUE: shoulder abduction, elbow flextion 4+/5, elbow extension 4/5, wrist extension 3/5, DI 2/5 LLE: 4+/5 proximal to distal Able to sit/stand without assistance  Skin: Skin is warm and dry.  Psychiatric: His affect is inappropriate.    Results for orders placed or performed during the hospital encounter of 02/04/18 (from the past 24 hour(s))  Comprehensive metabolic panel     Status: Abnormal   Collection Time: 02/06/18  5:02 AM  Result Value Ref Range   Sodium 142 135 - 145 mmol/L   Potassium 3.4 (L) 3.5 - 5.1 mmol/L   Chloride 108 101 - 111 mmol/L   CO2 26 22 - 32 mmol/L   Glucose, Bld 104 (H)  65 - 99 mg/dL   BUN 9 6 - 20 mg/dL   Creatinine, Ser 1.61 0.61 - 1.24 mg/dL   Calcium 8.6 (L) 8.9 - 10.3 mg/dL   Total Protein 5.8 (L) 6.5 - 8.1 g/dL   Albumin 3.1 (L) 3.5 - 5.0 g/dL   AST 31 15 - 41 U/L   ALT 27 17 - 63 U/L   Alkaline Phosphatase 58 38 - 126 U/L   Total Bilirubin 0.7 0.3 - 1.2 mg/dL   GFR calc non Af Amer >60 >60 mL/min   GFR calc Af Amer >60 >60 mL/min   Anion gap 8 5 - 15  CBC     Status: Abnormal   Collection Time:  02/06/18  5:02 AM  Result Value Ref Range   WBC 3.4 (L) 4.0 - 10.5 K/uL   RBC 4.16 (L) 4.22 - 5.81 MIL/uL   Hemoglobin 12.4 (L) 13.0 - 17.0 g/dL   HCT 09.6 (L) 04.5 - 40.9 %   MCV 88.0 78.0 - 100.0 fL   MCH 29.8 26.0 - 34.0 pg   MCHC 33.9 30.0 - 36.0 g/dL   RDW 81.1 91.4 - 78.2 %   Platelets 189 150 - 400 K/uL   Ct Angio Head W Or Wo Contrast  Result Date: 02/05/2018 CLINICAL DATA:  Stroke follow-up.  Left-sided weakness EXAM: CT ANGIOGRAPHY HEAD AND NECK TECHNIQUE: Multidetector CT imaging of the head and neck was performed using the standard protocol during bolus administration of intravenous contrast. Multiplanar CT image reconstructions and MIPs were obtained to evaluate the vascular anatomy. Carotid stenosis measurements (when applicable) are obtained utilizing NASCET criteria, using the distal internal carotid diameter as the denominator. CONTRAST:  50mL ISOVUE-370 IOPAMIDOL (ISOVUE-370) INJECTION 76% COMPARISON:  Head CT from yesterday.  Brain MRI from earlier today FINDINGS: CTA NECK FINDINGS Aortic arch: Atherosclerotic calcification. Two vessel branching pattern. Right carotid system: Mild calcified plaque at the ICA bulb. No stenosis or ulceration. Left carotid system: Mild to moderate atherosclerotic plaque at the common carotid bifurcation. No stenosis or ulceration. Vertebral arteries: No proximal subclavian stenosis. There aberrant right vertebral artery arising from the arch. The left vertebral artery is dominant. No vertebral stenosis. Skeleton: Cervical disc and facet degeneration. There is a visible downward migrating disc protrusion at C3-4, impinging on the cord. Retropharyngeal effusion extending from C1 to C5. No posterior longitudinal ligament ossification. No visible osteomyelitis. Other neck: Retropharyngeal effusion as noted above. No visible pharyngeal or laryngeal inflammation. Calcification in the lower left maxillary sinus, likely calcified retention cyst or radicular  cyst. Upper chest: Probable airway thickening. Minimal emphysematous change. Review of the MIP images confirms the above findings CTA HEAD FINDINGS Anterior circulation: Atherosclerotic plaque on the carotid siphons without flow limiting stenosis. No branch occlusion, beading, or aneurysm. Posterior circulation: Strong left vertebral artery dominance. The right vertebral artery ends in PICA. Vertebral and basilar arteries are smooth and diffusely patent. Large posterior communicating arteries. Robust and symmetric bilateral PCA flow Venous sinuses: Patent Anatomic variants: None unusual. Incomplete circle-of-Willis at the anterior communicating artery Delayed phase: No abnormal intracranial enhancement. A call has been placed to the hospitalist service. Review of the MIP images confirms the above findings IMPRESSION: 1. No emergent vascular finding. There is atherosclerosis without flow limiting stenosis or ulceration. 2. Retropharyngeal effusion without visible mucosal or spinal source. This could be a manifestation of spinal infection in this patient with neurologic deficits. Suggest MRI the cervical spine with contrast. 3. Cervical spine degeneration with C3-4 disc protrusion  impinging on the cord. Electronically Signed   By: Marnee Spring M.D.   On: 02/05/2018 08:34   Dg Chest 1 View  Result Date: 02/05/2018 CLINICAL DATA:  Weakness EXAM: CHEST  1 VIEW COMPARISON:  02/03/2018 FINDINGS: Heart and mediastinal contours are within normal limits. No focal opacities or effusions. No acute bony abnormality. IMPRESSION: No active disease. Electronically Signed   By: Charlett Nose M.D.   On: 02/05/2018 09:03   Dg Knee 1-2 Views Right  Result Date: 02/05/2018 CLINICAL DATA:  Chronic pain EXAM: RIGHT KNEE - 1-2 VIEW COMPARISON:  Right knee radiographs April 13, 2007 and right knee MRI April 19, 2007 FINDINGS: Frontal and lateral views were obtained. No acute fracture or dislocation. There is a sizable joint  effusion. There is marked osteoarthritic change diffusely with essentially complete loss of joint space medially as well as marked narrowing in the patellofemoral joint region. There is generalized bony overgrowth. IMPRESSION: Advanced arthropathy with marked progression from prior studies. There is a sizable joint effusion. There is prominent spurring in all compartments. There is sclerosis and essentially complete joint space loss medially. Marked spurring and marked narrowing in the patellofemoral joint region noted. No acute fracture or dislocation evident. Electronically Signed   By: Bretta Bang III M.D.   On: 02/05/2018 09:07   Ct Head Wo Contrast  Result Date: 02/04/2018 CLINICAL DATA:  Left-sided weakness EXAM: CT HEAD WITHOUT CONTRAST TECHNIQUE: Contiguous axial images were obtained from the base of the skull through the vertex without intravenous contrast. COMPARISON:  None. FINDINGS: Brain: No mass lesion, intraparenchymal hemorrhage or extra-axial collection. No evidence of acute cortical infarct. Normal appearance of the brain parenchyma and extra axial spaces for age. Vascular: No hyperdense vessel or unexpected vascular calcification. Skull: Normal visualized skull base, calvarium and extracranial soft tissues. Sinuses/Orbits: Mineralized area within the left maxillary sinus, without thickening of the maxillary sinus walls. No sinus mucosal abnormality. Normal orbits. IMPRESSION: 1. Normal brain. 2. 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 Electronically Signed   By: Deatra Robinson M.D.   On: 02/04/2018 21:01   Ct Soft Tissue Neck W Contrast  Result Date: 02/06/2018 CLINICAL DATA:  62 y/o M; retropharyngeal/prevertebral fluid collection. EXAM: CT NECK WITH CONTRAST TECHNIQUE: Multidetector CT imaging of the neck was performed using the standard protocol following the bolus administration of  intravenous contrast. CONTRAST:  75mL OMNIPAQUE IOHEXOL 300 MG/ML  SOLN COMPARISON:  02/05/2018 MRI cervical spine and CT angiogram of the neck. FINDINGS: Pharynx and larynx: Normal. No mass or swelling. Salivary glands: No inflammation, mass, or stone. Thyroid: Normal. Lymph nodes: None enlarged or abnormal density. Vascular: Mild calcific atherosclerosis of the aortic arch and the bilateral carotid bifurcations. Limited intracranial: Negative. Visualized orbits: Negative. Mastoids and visualized paranasal sinuses: There is a dome-shaped bony structure extending from the left maxillary alveolar bone in the oral cavity into the left maxillary sinus antrum which may represent a chronically calcified oral antral fistula or dental cavity. Skeleton: Multilevel degenerative changes of the cervical spine greatest at the C3-4 level for a disc herniation results in canal stenosis better characterized on the prior cervical MRI. Prevertebral fluid collection extending from the C1 to the C5 vertebral body (series 7, image 52). There is no rim enhancement of the prevertebral fluid collection and there is no appreciable inflammatory change in the surrounding deep cervical compartments. Upper chest: Negative. Other: None. IMPRESSION: 1. Again seen is a  prevertebral/retropharyngeal fluid collection extending from C1-C5. No peripheral enhancement of the fluid collection and no inflammation of surrounding deep cervical compartments is identified. The fluid collection is probably a reactive effusion possibly related to adenoid tonsillitis, longus coli tendinitis, or possibly sequelae of trauma as discussed on prior studies. Abscess is unlikely. 2. Mild calcific atherosclerosis of the aorta and bilateral carotid bifurcations. 3. Spondylosis of the cervical spine greatest at C3-4 where a disc herniation results in canal stenosis. Electronically Signed   By: Mitzi Hansen M.D.   On: 02/06/2018 02:15   Ct Angio Neck W Or Wo  Contrast  Result Date: 02/05/2018 CLINICAL DATA:  Stroke follow-up.  Left-sided weakness EXAM: CT ANGIOGRAPHY HEAD AND NECK TECHNIQUE: Multidetector CT imaging of the head and neck was performed using the standard protocol during bolus administration of intravenous contrast. Multiplanar CT image reconstructions and MIPs were obtained to evaluate the vascular anatomy. Carotid stenosis measurements (when applicable) are obtained utilizing NASCET criteria, using the distal internal carotid diameter as the denominator. CONTRAST:  50mL ISOVUE-370 IOPAMIDOL (ISOVUE-370) INJECTION 76% COMPARISON:  Head CT from yesterday.  Brain MRI from earlier today FINDINGS: CTA NECK FINDINGS Aortic arch: Atherosclerotic calcification. Two vessel branching pattern. Right carotid system: Mild calcified plaque at the ICA bulb. No stenosis or ulceration. Left carotid system: Mild to moderate atherosclerotic plaque at the common carotid bifurcation. No stenosis or ulceration. Vertebral arteries: No proximal subclavian stenosis. There aberrant right vertebral artery arising from the arch. The left vertebral artery is dominant. No vertebral stenosis. Skeleton: Cervical disc and facet degeneration. There is a visible downward migrating disc protrusion at C3-4, impinging on the cord. Retropharyngeal effusion extending from C1 to C5. No posterior longitudinal ligament ossification. No visible osteomyelitis. Other neck: Retropharyngeal effusion as noted above. No visible pharyngeal or laryngeal inflammation. Calcification in the lower left maxillary sinus, likely calcified retention cyst or radicular cyst. Upper chest: Probable airway thickening. Minimal emphysematous change. Review of the MIP images confirms the above findings CTA HEAD FINDINGS Anterior circulation: Atherosclerotic plaque on the carotid siphons without flow limiting stenosis. No branch occlusion, beading, or aneurysm. Posterior circulation: Strong left vertebral artery  dominance. The right vertebral artery ends in PICA. Vertebral and basilar arteries are smooth and diffusely patent. Large posterior communicating arteries. Robust and symmetric bilateral PCA flow Venous sinuses: Patent Anatomic variants: None unusual. Incomplete circle-of-Willis at the anterior communicating artery Delayed phase: No abnormal intracranial enhancement. A call has been placed to the hospitalist service. Review of the MIP images confirms the above findings IMPRESSION: 1. No emergent vascular finding. There is atherosclerosis without flow limiting stenosis or ulceration. 2. Retropharyngeal effusion without visible mucosal or spinal source. This could be a manifestation of spinal infection in this patient with neurologic deficits. Suggest MRI the cervical spine with contrast. 3. 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 Electronically Signed   By: Marnee Spring M.D.   On: 02/05/2018 08:34   Mr Brain Wo Contrast  Result Date: 02/05/2018 CLINICAL DATA:  Left-sided weakness EXAM: MRI HEAD WITHOUT CONTRAST TECHNIQUE: Multiplanar, multiecho pulse sequences of the brain and surrounding structures were obtained without intravenous contrast. COMPARISON:  Head CT from yesterday FINDINGS: Brain: No acute infarction, hemorrhage, hydrocephalus, extra-axial collection or mass lesion. Mild for age FLAIR hyperintensity in the cerebral white matter Vascular: Major flow voids are preserved Skull and upper cervical spine: Normal marrow signal Sinuses/Orbits: Polypoid density in the inferior left maxillary sinus that is calcified by CT, likely calcified retention  cyst. IMPRESSION: Negative for infarct.  No explanation for symptoms. Electronically Signed   By: Marnee Spring M.D.   On: 02/05/2018 07:43   Mr Cervical Spine W Wo Contrast  Addendum Date: 02/05/2018   ADDENDUM REPORT: 02/05/2018 11:55 ADDENDUM: Study discussed by telephone with Dr. Erin Hearing on 02/05/2018 at  1147 hours. One possibility we discussed regarding the indeterminate abnormal retropharyngeal space (#1) is recent trauma, such as a fall with anterior cervical ligamentous injury. In that hypothetical scenario, the disc herniation resulting in stenosis affecting the spinal cord with signal abnormality at C3-C4 (#2) might also be acute/recent. Electronically Signed   By: Odessa Fleming M.D.   On: 02/05/2018 11:55   Result Date: 02/05/2018 CLINICAL DATA:  62 year old male with left upper extremity weakness, difficulty walking. Abnormal retropharyngeal space on CTA head and neck earlier today. EXAM: MRI CERVICAL SPINE WITHOUT AND WITH CONTRAST TECHNIQUE: Multiplanar and multiecho pulse sequences of the cervical spine, to include the craniocervical junction and cervicothoracic junction, were obtained without and with intravenous contrast. CONTRAST:  14mL MULTIHANCE GADOBENATE DIMEGLUMINE 529 MG/ML IV SOLN COMPARISON:  CTA head and neck 0817 hours. Brain MRI 0655 hours today. FINDINGS: Alignment: Stable straightening of cervical lordosis as seen on the CTA today. Vertebrae: Heterogeneous bone marrow signal throughout the cervical spine, although no convincing marrow edema (subtle C6 and C7 increased vertebral body STIR signal is not correlated with decreased precontrast T1 bone marrow. Cord: Evidence of abnormal increased T2 and STIR signal within the spinal cord at C3-C4 corresponding to compression due to disc herniation and spinal stenosis. See additional details of that level below. No cord signal abnormality above or below the C3-C4 level. No definite abnormal intradural enhancement. No dural thickening. Posterior Fossa, vertebral arteries, paraspinal tissues: Cervicomedullary junction is within normal limits. Negative visible brain parenchyma. Confluent abnormal fluid or edema signal in the retropharyngeal or prevertebral soft tissue space re-demonstrated (series 2, image 9, series 5, image 24), and measures up to 8  millimeters in thickness (series 5, image 19). However, postcontrast images demonstrate some intermediate signal rather than low signal in the epicenter of this finding (series 10, image 19) suggesting this may be edema rather than overt fluid. At the same time the adjacent longus coli muscles remain normal. There is no pharyngeal abnormality evident. Furthermore, although the underlying C3-C4 and C4-C5 discs are degenerated, there is no enhancement or inflammation identified within the disc spaces, and only trace degenerative appearing enhancement at the C3 anterior inferior endplate. There is no other signal abnormality in the cervical spine ligamentous complex. The other neck soft tissues appear negative. Negative visible lung apices. Disc levels: C2-C3: Disc bulge and endplate spurring plus right greater than left facet hypertrophy without significant spinal stenosis. There is mild to moderate right C3 foraminal stenosis. C3-C4: Circumferential disc bulge with superimposed moderate size disc extrusion or protrusion (see series 7, image 17) resulting in mild to moderate spinal stenosis and cord mass effect with signal abnormality suspected in the cord as stated above. Right greater than left foraminal involvement and superimposed facet hypertrophy. Mild to moderate left and moderate to severe right C4 foraminal stenosis. C4-C5: Circumferential disc osteophyte complex with moderate to severe right facet hypertrophy. No significant spinal stenosis. Moderate to severe right C5 foraminal stenosis. C5-C6: Disc space loss with circumferential disc osteophyte complex. Broad-based posterior component with mild spinal stenosis and spinal cord mass effect. No cord signal abnormality. Severe left and moderate to severe right C6 foraminal stenosis. C6-C7: Disc space  loss with circumferential disc osteophyte complex. Mild ligament flavum hypertrophy. Spinal stenosis with up to mild spinal cord mass effect. Severe bilateral C7  foraminal stenosis. C7-T1: Circumferential disc osteophyte complex with broad-based posterior and biforaminal involvement. Mild facet hypertrophy. Mild spinal stenosis and possible spinal cord mass effect. Severe bilateral C8 foraminal stenosis. No upper thoracic spinal stenosis or cord signal abnormality. IMPRESSION: 1. Prominent fluid and/or edema in the retropharyngeal or prevertebral space tracking from the C1 to the C5 level as seen on CTA neck earlier today. This is nonspecific. There is NO strong evidence of underlying cervical discitis/osteomyelitis. The underlying longus coli muscles remain normal, and also the patient also lacks the typical calcifications of hydroxyapatite deposition disease on the CTA today, arguing against acute prevertebral/retropharyngeal tinnitus. 2. However, the study is positive for degenerative spinal stenosis at C3-C4 in part due to a moderate size disc herniation in the midline (series 7, image 17), resulting in spinal stenosis with cord mass effect and abnormal cord signal. Associated moderate to severe right greater than left C4 foraminal stenosis. 3. Advanced Cervical spine degeneration also C5-C6 through C7-T1 with additional spinal stenosis, severe bilateral foraminal stenosis, and up to mild spinal cord mass effect, but no additional cord signal abnormality. Electronically Signed: By: Odessa Fleming M.D. On: 02/05/2018 11:43     Assessment/Plan: Diagnosis: Cervical myelopathy Labs and images independently reviewed.  Records reviewed and summated above.  1. Does the need for close, 24 hr/day medical supervision in concert with the patient's rehab needs make it unreasonable for this patient to be served in a less intensive setting? Potentially  2. Co-Morbidities requiring supervision/potential complications: HTN (monitor and provide prns in accordance with increased physical exertion and pain), tobacco and alcohol abuse (counsel), chronic back pain (Biofeedback training  with therapies to help reduce reliance on opiate pain medications, monitor pain control during therapies, and sedation at rest and titrate to maximum efficacy to ensure participation and gains in therapies), hypokalemia (continue to monitor and replete as necessary), ABLA (transfuse if necessary to ensure appropriate perfusion for increased activity tolerance), Hep C (avoid hepatoxic meds) 3. Due to safety, disease management, pain management and patient education, does the patient require 24 hr/day rehab nursing? Potentially 4. Does the patient require coordinated care of a physician, rehab nurse, PT (1-2 hrs/day, 5 days/week) and OT (1-2 hrs/day, 5 days/week) to address physical and functional deficits in the context of the above medical diagnosis(es)? Potentially Addressing deficits in the following areas: balance, endurance, locomotion, strength, transferring, bathing, dressing, toileting and psychosocial support 5. Can the patient actively participate in an intensive therapy program of at least 3 hrs of therapy per day at least 5 days per week? Yes 6. The potential for patient to make measurable gains while on inpatient rehab is excellent 7. Anticipated functional outcomes upon discharge from inpatient rehab are min assist  with PT, min assist with OT, n/a with SLP. 8. Estimated rehab length of stay to reach the above functional goals is: 7-12 days. 9. Anticipated D/C setting: Home 10. Anticipated post D/C treatments: HH therapy and Home excercise program 11. Overall Rehab/Functional Prognosis: good  RECOMMENDATIONS: This patient's condition is appropriate for continued rehabilitative care in the following setting: Pt poor and scattered historian.  Unclear baseline level of functioning, however, there appears to be an acute on chronic process, with postential fall vs. hyperextension when intoxicated last week.  He also notes hip pain to be his largest deficit and believes his LUE is baseline.  Expected limited improvement with rehab alone given ongoing cord compression.  Further, pt would likely benefit from CIR more after imminent surgical intervention. Will await final plans. Patient also making significant functional gain from day/day. Patient has agreed to participate in recommended program. Potentially Note that insurance prior authorization may be required for reimbursement for recommended care.  Comment: Rehab Admissions Coordinator to follow up.   I have personally performed a face to face diagnostic evaluation, including, but not limited to relevant history and physical exam findings, of this patient and developed relevant assessment and plan.  Additionally, I have reviewed and concur with the physician assistant's documentation above.   Maryla Morrow, MD, ABPMR Mcarthur Rossetti Angiulli, PA-C 02/06/2018

## 2018-02-07 NOTE — Progress Notes (Signed)
I contacted Dr. Arnoldo Morale by phone to clarify if surgery pending in the immediate future. He states patient will follow up with him in the office. Not planned. I met with patient at bedside to discussed that Rehab MD feels possible CIR needed after surgery. Please refer to Dr. Serita Grit consult. Patient states his wife can provide 24/7 supervision and that his son and friend can assist him up to his third level apartment. I have discussed with RN CM, Kelli and home health is planned. We will sign off at this time. [phone]

## 2018-02-07 NOTE — Progress Notes (Signed)
Patient discharged home with equipments,  IV and telemetry discontinued.  AVS discussed.  Patient comfortable upon discharge.  Recommended equipment delivered to patient.

## 2018-02-07 NOTE — Care Management Note (Signed)
Case Management Note  Patient Details  Name: David Keith MRN: [id number] Date of Birth: 1956-05-13  Subjective/Objective:                    Action/Plan: Pt is not able to d/c to CIR. Pt is d/cing home with [name] services. CM provided him choice of HH agencies and he selected [facility]. CM called [facility] and they accepted the referral. Information requested was faxed.  Pt with orders for walker. James with [facility] DME notified and delivered the equipment to the room. Pt has supervision/ assistance at home and transportation to home.   Expected Discharge Date:  02/07/18               Expected Discharge Plan:  Home w Home Health Services  In-House Referral:     Discharge planning Services  CM Consult  Post Acute Care Choice:  Durable Medical Equipment, Home Health Choice offered to:  Patient  DME Arranged:  Walker rolling DME Agency:  [facility]  HH Arranged:  PT, OT [facility] Agency:  [facility]  Status of Service:  Completed, signed off  If discussed at Long Length of Stay Meetings, dates discussed:    Additional Comments:  David Balo, RN 02/07/2018, 1:43 PM

## 2018-02-07 NOTE — Progress Notes (Signed)
Patient seen and examined. No changes to plan. Plan for CIR vs home with home health. Discontinue telemetry.  Jacquelin Hawking, MD [facility] 02/07/2018, 9:28 AM Pager: [phone]

## 2018-02-07 NOTE — Care Management Obs Status (Signed)
MEDICARE OBSERVATION STATUS NOTIFICATION   Patient Details  Name: David Keith MRN: [id number] Date of Birth: 13-Dec-1955   Medicare Observation Status Notification Given:  Yes    Elliot Cousin, RN 02/07/2018, 12:07 PM

## 2018-02-08 DIAGNOSIS — I69354 Hemiplegia and hemiparesis following cerebral infarction affecting left non-dominant side: Secondary | ICD-10-CM | POA: Diagnosis not present

## 2018-02-08 DIAGNOSIS — M5031 Other cervical disc degeneration,  high cervical region: Secondary | ICD-10-CM | POA: Diagnosis not present

## 2018-02-08 DIAGNOSIS — M1990 Unspecified osteoarthritis, unspecified site: Secondary | ICD-10-CM | POA: Diagnosis not present

## 2018-02-08 DIAGNOSIS — I10 Essential (primary) hypertension: Secondary | ICD-10-CM | POA: Diagnosis not present

## 2018-02-08 DIAGNOSIS — D649 Anemia, unspecified: Secondary | ICD-10-CM | POA: Diagnosis not present

## 2018-02-08 DIAGNOSIS — M4802 Spinal stenosis, cervical region: Secondary | ICD-10-CM | POA: Diagnosis not present

## 2018-02-08 LAB — VITAMIN B1: VITAMIN B1 (THIAMINE): 90.7 nmol/L (ref 66.5–200.0)

## 2018-02-14 DIAGNOSIS — M4802 Spinal stenosis, cervical region: Secondary | ICD-10-CM | POA: Diagnosis not present

## 2018-02-14 DIAGNOSIS — I69354 Hemiplegia and hemiparesis following cerebral infarction affecting left non-dominant side: Secondary | ICD-10-CM | POA: Diagnosis not present

## 2018-02-14 DIAGNOSIS — I10 Essential (primary) hypertension: Secondary | ICD-10-CM | POA: Diagnosis not present

## 2018-02-14 DIAGNOSIS — D649 Anemia, unspecified: Secondary | ICD-10-CM | POA: Diagnosis not present

## 2018-02-14 DIAGNOSIS — M1990 Unspecified osteoarthritis, unspecified site: Secondary | ICD-10-CM | POA: Diagnosis not present

## 2018-02-14 DIAGNOSIS — M5031 Other cervical disc degeneration,  high cervical region: Secondary | ICD-10-CM | POA: Diagnosis not present

## 2018-02-15 DIAGNOSIS — I10 Essential (primary) hypertension: Secondary | ICD-10-CM | POA: Diagnosis not present

## 2018-02-15 DIAGNOSIS — M5031 Other cervical disc degeneration,  high cervical region: Secondary | ICD-10-CM | POA: Diagnosis not present

## 2018-02-15 DIAGNOSIS — I69354 Hemiplegia and hemiparesis following cerebral infarction affecting left non-dominant side: Secondary | ICD-10-CM | POA: Diagnosis not present

## 2018-02-15 DIAGNOSIS — R030 Elevated blood-pressure reading, without diagnosis of hypertension: Secondary | ICD-10-CM | POA: Diagnosis not present

## 2018-02-15 DIAGNOSIS — M1990 Unspecified osteoarthritis, unspecified site: Secondary | ICD-10-CM | POA: Diagnosis not present

## 2018-02-15 DIAGNOSIS — M4712 Other spondylosis with myelopathy, cervical region: Secondary | ICD-10-CM | POA: Diagnosis not present

## 2018-02-15 DIAGNOSIS — M4802 Spinal stenosis, cervical region: Secondary | ICD-10-CM | POA: Diagnosis not present

## 2018-02-15 DIAGNOSIS — G8192 Hemiplegia, unspecified affecting left dominant side: Secondary | ICD-10-CM | POA: Diagnosis not present

## 2018-02-15 DIAGNOSIS — D649 Anemia, unspecified: Secondary | ICD-10-CM | POA: Diagnosis not present

## 2018-02-20 ENCOUNTER — Other Ambulatory Visit: Payer: Self-pay | Admitting: Neurosurgery

## 2018-02-20 DIAGNOSIS — M4802 Spinal stenosis, cervical region: Secondary | ICD-10-CM | POA: Diagnosis not present

## 2018-02-20 DIAGNOSIS — I69354 Hemiplegia and hemiparesis following cerebral infarction affecting left non-dominant side: Secondary | ICD-10-CM | POA: Diagnosis not present

## 2018-02-20 DIAGNOSIS — D649 Anemia, unspecified: Secondary | ICD-10-CM | POA: Diagnosis not present

## 2018-02-20 DIAGNOSIS — M1990 Unspecified osteoarthritis, unspecified site: Secondary | ICD-10-CM | POA: Diagnosis not present

## 2018-02-20 DIAGNOSIS — I10 Essential (primary) hypertension: Secondary | ICD-10-CM | POA: Diagnosis not present

## 2018-02-20 DIAGNOSIS — M5031 Other cervical disc degeneration,  high cervical region: Secondary | ICD-10-CM | POA: Diagnosis not present

## 2018-02-22 ENCOUNTER — Ambulatory Visit (INDEPENDENT_AMBULATORY_CARE_PROVIDER_SITE_OTHER): Payer: Medicare Other | Admitting: Family Medicine

## 2018-02-22 ENCOUNTER — Encounter: Payer: Self-pay | Admitting: Family Medicine

## 2018-02-22 VITALS — BP 135/81 | HR 78 | Temp 98.8°F | Resp 16 | Ht 69.0 in | Wt 146.0 lb

## 2018-02-22 DIAGNOSIS — M5031 Other cervical disc degeneration,  high cervical region: Secondary | ICD-10-CM | POA: Diagnosis not present

## 2018-02-22 DIAGNOSIS — M1990 Unspecified osteoarthritis, unspecified site: Secondary | ICD-10-CM | POA: Diagnosis not present

## 2018-02-22 DIAGNOSIS — B1920 Unspecified viral hepatitis C without hepatic coma: Secondary | ICD-10-CM | POA: Diagnosis not present

## 2018-02-22 DIAGNOSIS — I69354 Hemiplegia and hemiparesis following cerebral infarction affecting left non-dominant side: Secondary | ICD-10-CM | POA: Diagnosis not present

## 2018-02-22 DIAGNOSIS — M4802 Spinal stenosis, cervical region: Secondary | ICD-10-CM | POA: Diagnosis not present

## 2018-02-22 DIAGNOSIS — I10 Essential (primary) hypertension: Secondary | ICD-10-CM

## 2018-02-22 DIAGNOSIS — Z8739 Personal history of other diseases of the musculoskeletal system and connective tissue: Secondary | ICD-10-CM | POA: Diagnosis not present

## 2018-02-22 DIAGNOSIS — D649 Anemia, unspecified: Secondary | ICD-10-CM | POA: Diagnosis not present

## 2018-02-22 DIAGNOSIS — Z720 Tobacco use: Secondary | ICD-10-CM

## 2018-02-22 LAB — POCT URINALYSIS DIPSTICK
BILIRUBIN UA: NEGATIVE
Glucose, UA: NEGATIVE
Ketones, UA: NEGATIVE
LEUKOCYTES UA: NEGATIVE
Nitrite, UA: NEGATIVE
PH UA: 5.5 (ref 5.0–8.0)
PROTEIN UA: NEGATIVE
RBC UA: NEGATIVE
Spec Grav, UA: 1.025 (ref 1.010–1.025)
UROBILINOGEN UA: 0.2 U/dL

## 2018-02-22 NOTE — Progress Notes (Signed)
Subjective:    Patient ID: David Keith, male    DOB: August 13, 1956, 62 y.o.   MRN: [id number]  [name], very pleasant 62 year old male with a history of hypertension, hepatitis C and right knee pain presents for a six-month follow-up.  Patient has been taking all prescribed medications consistently.  He does not check blood pressures at home.  He also does not follow a low-fat, low-sodium diet or exercise routinely.   During previous appointment patient was screened for hepatitis C. hepatitis C was confirmed positive.   Patient was previously referred to infectious disease, but missed appointment. He has not rescheduled.    Patient was recently admitted to inpatient services and was found to have significant cervical myelopathy and spinal stenosis. Patient endorses difficulty ambulating and is utilizing a walker for assistance. David Keith is followed by neurosurgery and is scheduled for decompression surgery on 03/05/2018.   Hypertension  The problem is controlled. Pertinent negatives include no anxiety, blurred vision, chest pain, headaches, malaise/fatigue, neck pain, orthopnea, palpitations, peripheral edema, PND, shortness of breath or sweats. Risk factors for coronary artery disease include smoking/tobacco exposure and sedentary lifestyle. The current treatment provides moderate improvement. There are no compliance problems.  There is no history of chronic renal disease, coarctation of the aorta, hyperaldosteronism, hypercortisolism, hyperparathyroidism, a hypertension causing med, pheochromocytoma, renovascular disease, sleep apnea or a thyroid problem.    Past Medical History:  Diagnosis Date  . Chronic back pain    radiculopathy and stenosis  . Hypertension    takes AMlodipine daily  . Joint swelling    right knee  . Weakness    numbness in both legs when walking   Social History   Socioeconomic History  . Marital status: Married    Spouse name: Not on file  .  Number of children: Not on file  . Years of education: Not on file  . Highest education level: Not on file  Occupational History  . Not on file  Social Needs  . Financial resource strain: Not on file  . Food insecurity:    Worry: Not on file    Inability: Not on file  . Transportation needs:    Medical: Not on file    Non-medical: Not on file  Tobacco Use  . Smoking status: Current Every Day Smoker    Packs/day: 0.50    Years: 20.00    Pack years: 10.00    Types: Cigarettes  . Smokeless tobacco: Never Used  . Tobacco comment: 3 packs a week  Substance and Sexual Activity  . Alcohol use: Yes    Alcohol/week: 1.2 oz    Types: 2 Cans of beer per week    Comment: daily  . Drug use: Yes    Types: Marijuana  . Sexual activity: Not on file  Lifestyle  . Physical activity:    Days per week: Not on file    Minutes per session: Not on file  . Stress: Not on file  Relationships  . Social connections:    Talks on phone: Not on file    Gets together: Not on file    Attends religious service: Not on file    Active member of club or organization: Not on file    Attends meetings of clubs or organizations: Not on file    Relationship status: Not on file  . Intimate partner violence:    Fear of current or ex partner: Not on file    Emotionally  abused: Not on file    Physically abused: Not on file    Forced sexual activity: Not on file  Other Topics Concern  . Not on file  Social History Narrative  . Not on file   Immunization History  Administered Date(s) Administered  . Pneumococcal Polysaccharide-23 02/08/2016  . Tdap 02/08/2016   Review of Systems  Constitutional: Negative.  Negative for fatigue and malaise/fatigue.  HENT: Negative.   Eyes: Negative.  Negative for blurred vision and visual disturbance.  Respiratory: Negative.  Negative for shortness of breath.   Cardiovascular: Negative.  Negative for chest pain, palpitations, orthopnea and PND.  Endocrine: Negative.   Negative for polydipsia, polyphagia and polyuria.  Genitourinary: Negative.   Musculoskeletal: Positive for back pain, gait problem, joint swelling and myalgias (right knee pain). Negative for neck pain.  Skin: Negative.   Allergic/Immunologic: Negative.   Neurological: Positive for weakness. Negative for tingling, numbness and headaches.  Hematological: Negative.   Psychiatric/Behavioral: Negative.        Objective:   Physical Exam  Constitutional: He is oriented to person, place, and time. He appears well-developed and well-nourished.  HENT:  Head: Normocephalic and atraumatic.  Right Ear: External ear normal.  Left Ear: External ear normal.  Nose: Nose normal.  Mouth/Throat: Oropharynx is clear and moist.  Eyes: Pupils are equal, round, and reactive to light. Conjunctivae and EOM are normal.  Neck: Normal range of motion. Neck supple.  Cardiovascular: Normal rate, regular rhythm, normal heart sounds and intact distal pulses.  Abdominal: Soft. Bowel sounds are normal.  Musculoskeletal:       Right knee: He exhibits decreased range of motion and swelling.       Lumbar back: He exhibits decreased range of motion.  Guarded  Neurological: He is oriented to person, place, and time. He has normal reflexes. Coordination and gait abnormal.  Skin: Skin is warm and dry.  Psychiatric: He has a normal mood and affect. His behavior is normal. Judgment and thought content normal.      BP 135/81 (BP Location: Left Arm, Patient Position: Sitting, Cuff Size: Normal)   Pulse 78   Temp 98.8 F (37.1 C) (Oral)   Resp 16   Ht  (1.753 m)   Wt 146 lb (66.2 kg)   SpO2 100%   BMI 21.56 kg/m  Assessment & Plan:  1. Essential hypertension Blood pressure is at goal on current medication regimen, no medication changes warranted on today.  We have discussed target BP range and blood pressure goal. I have advised patient to check BP regularly and to call us back or report to clinic if the  numbers are consistently higher than 140/90. We discussed the importance of compliance with medical therapy and DASH diet recommended, consequences of uncontrolled hypertension discussed.  - continue current BP medications  - Basic metabolic panel; Future - Urinalysis Dipstick - Basic metabolic panel  2. Hepatitis C virus infection without hepatic coma, unspecified chronicity Will send a new referral to infectious disease.  Patient missed previous appointment to establish care and did not reschedule.   3. Tobacco abuse Smoking cessation instruction/counseling given:  counseled patient on the dangers of tobacco use, advised patient to stop smoking, and reviewed strategies to maximize success  4. History of spinal stenosis Patient to follow up with neurosurgery as scheduled. He is to undergo decompression surgery on 03/05/2018.    RTC: 6 months for hypertension   Nolon Nations  MSN, FNP-C [facility] [address] [place], [place] [address] [phone]

## 2018-02-23 LAB — BASIC METABOLIC PANEL
BUN/Creatinine Ratio: 19 (ref 10–24)
BUN: 14 mg/dL (ref 8–27)
CALCIUM: 9.8 mg/dL (ref 8.6–10.2)
CHLORIDE: 105 mmol/L (ref 96–106)
CO2: 24 mmol/L (ref 20–29)
Creatinine, Ser: 0.73 mg/dL — ABNORMAL LOW (ref 0.76–1.27)
GFR, EST AFRICAN AMERICAN: 114 mL/min/{1.73_m2} (ref >59)
GFR, EST NON AFRICAN AMERICAN: 99 mL/min/{1.73_m2} (ref >59)
Glucose: 93 mg/dL (ref 65–99)
Potassium: 4.9 mmol/L (ref 3.5–5.2)
SODIUM: 142 mmol/L (ref 134–144)

## 2018-02-25 DIAGNOSIS — M5031 Other cervical disc degeneration,  high cervical region: Secondary | ICD-10-CM | POA: Diagnosis not present

## 2018-02-25 DIAGNOSIS — M1990 Unspecified osteoarthritis, unspecified site: Secondary | ICD-10-CM | POA: Diagnosis not present

## 2018-02-25 DIAGNOSIS — I10 Essential (primary) hypertension: Secondary | ICD-10-CM | POA: Diagnosis not present

## 2018-02-25 DIAGNOSIS — D649 Anemia, unspecified: Secondary | ICD-10-CM | POA: Diagnosis not present

## 2018-02-25 DIAGNOSIS — I69354 Hemiplegia and hemiparesis following cerebral infarction affecting left non-dominant side: Secondary | ICD-10-CM | POA: Diagnosis not present

## 2018-02-25 DIAGNOSIS — M4802 Spinal stenosis, cervical region: Secondary | ICD-10-CM | POA: Diagnosis not present

## 2018-02-26 DIAGNOSIS — I69354 Hemiplegia and hemiparesis following cerebral infarction affecting left non-dominant side: Secondary | ICD-10-CM | POA: Diagnosis not present

## 2018-02-26 DIAGNOSIS — M1990 Unspecified osteoarthritis, unspecified site: Secondary | ICD-10-CM | POA: Diagnosis not present

## 2018-02-26 DIAGNOSIS — I10 Essential (primary) hypertension: Secondary | ICD-10-CM | POA: Diagnosis not present

## 2018-02-26 DIAGNOSIS — D649 Anemia, unspecified: Secondary | ICD-10-CM | POA: Diagnosis not present

## 2018-02-26 DIAGNOSIS — M5031 Other cervical disc degeneration,  high cervical region: Secondary | ICD-10-CM | POA: Diagnosis not present

## 2018-02-26 DIAGNOSIS — M4802 Spinal stenosis, cervical region: Secondary | ICD-10-CM | POA: Diagnosis not present

## 2018-02-28 DIAGNOSIS — M4802 Spinal stenosis, cervical region: Secondary | ICD-10-CM | POA: Diagnosis not present

## 2018-02-28 DIAGNOSIS — M1990 Unspecified osteoarthritis, unspecified site: Secondary | ICD-10-CM | POA: Diagnosis not present

## 2018-02-28 DIAGNOSIS — I10 Essential (primary) hypertension: Secondary | ICD-10-CM | POA: Diagnosis not present

## 2018-02-28 DIAGNOSIS — I69354 Hemiplegia and hemiparesis following cerebral infarction affecting left non-dominant side: Secondary | ICD-10-CM | POA: Diagnosis not present

## 2018-02-28 DIAGNOSIS — D649 Anemia, unspecified: Secondary | ICD-10-CM | POA: Diagnosis not present

## 2018-02-28 DIAGNOSIS — M5031 Other cervical disc degeneration,  high cervical region: Secondary | ICD-10-CM | POA: Diagnosis not present

## 2018-03-01 ENCOUNTER — Other Ambulatory Visit: Payer: Self-pay | Admitting: Neurosurgery

## 2018-03-01 DIAGNOSIS — M4802 Spinal stenosis, cervical region: Secondary | ICD-10-CM | POA: Diagnosis not present

## 2018-03-01 DIAGNOSIS — I69354 Hemiplegia and hemiparesis following cerebral infarction affecting left non-dominant side: Secondary | ICD-10-CM | POA: Diagnosis not present

## 2018-03-01 DIAGNOSIS — M5031 Other cervical disc degeneration,  high cervical region: Secondary | ICD-10-CM | POA: Diagnosis not present

## 2018-03-01 DIAGNOSIS — D649 Anemia, unspecified: Secondary | ICD-10-CM | POA: Diagnosis not present

## 2018-03-01 DIAGNOSIS — I10 Essential (primary) hypertension: Secondary | ICD-10-CM | POA: Diagnosis not present

## 2018-03-01 DIAGNOSIS — M1990 Unspecified osteoarthritis, unspecified site: Secondary | ICD-10-CM | POA: Diagnosis not present

## 2018-03-04 NOTE — Pre-Procedure Instructions (Signed)
Hedy CamaraVincent G Zwiebel  03/04/2018      [facility] [phone] [place] - [address] [facility] [address] [place] [id number] Phone: [phone] Fax: [phone]  [facility] [place] [place] [address] Phone: [phone] Fax: [phone]    Your procedure is scheduled on Mon., March 11, 2018 from 2:45PM-5:20PM  [facility] "A" at 12:45PM  Call this number if you have problems the morning of surgery:  [phone]   Remember:  No food or drinks after midnight on June 16th    Take these medicines the morning of surgery with A SIP OF WATER: AmLODipine (NORVASC)   As of today, stop taking all Other Aspirin Products, Vitamins, Fish oils, and Herbal medications. Also stop all NSAIDS i.e. Advil, Ibuprofen, Motrin, Aleve, Anaprox, Naproxen, BC, Goody Powders, and all Supplements. Including: Meloxicam (MOBIC)     Do not wear jewelry.  Do not wear lotions, powders, colognes, or deodorant.  Do not shave 48 hours prior to surgery.  Men may shave face.  Do not bring valuables to the hospital.  [facility] is not responsible for any belongings or valuables.  Contacts, dentures or bridgework may not be worn into surgery.  Leave your suitcase in the car.  After surgery it may be brought to your room.  For patients admitted to the hospital, discharge time will be determined by your treatment team.  Patients discharged the day of surgery will not be allowed to drive home.   Special instructions:  [facility]- Preparing For Surgery  Before surgery, you can play an important role. Because skin is not sterile, your skin needs to be as free of germs as possible. You can reduce the number of germs on your skin by washing with CHG (chlorahexidine gluconate) Soap before surgery.  CHG is an antiseptic cleaner which kills germs and bonds  with the skin to continue killing germs even after washing.    Oral Hygiene is also important to reduce your risk of infection.  Remember - BRUSH YOUR TEETH THE MORNING OF SURGERY WITH YOUR REGULAR TOOTHPASTE  Please do not use if you have an allergy to CHG or antibacterial soaps. If your skin becomes reddened/irritated stop using the CHG.  Do not shave (including legs and underarms) for at least 48 hours prior to first CHG shower. It is OK to shave your face.  Please follow these instructions carefully.   1. Shower the NIGHT BEFORE SURGERY and the MORNING OF SURGERY with CHG.   2. If you chose to wash your hair, wash your hair first as usual with your normal shampoo.  3. After you shampoo, rinse your hair and body thoroughly to remove the shampoo.  4. Use CHG as you would any other liquid soap. You can apply CHG directly to the skin and wash gently with a scrungie or a clean washcloth.   5. Apply the CHG Soap to your body ONLY FROM THE NECK DOWN.  Do not use on open wounds or open sores. Avoid contact with your eyes, ears, mouth and genitals (private parts). Wash Face and genitals (private parts)  with your normal soap.  6. Wash thoroughly, paying special attention to the area where your surgery will be performed.  7. Thoroughly rinse your body with warm water from the neck down.  8. DO NOT shower/wash  with your normal soap after using and rinsing off the CHG Soap.  9. Pat yourself dry with a CLEAN TOWEL.  10. Wear CLEAN PAJAMAS to bed the night before surgery, wear comfortable clothes the morning of surgery  11. Place CLEAN SHEETS on your bed the night of your first shower and DO NOT SLEEP WITH PETS.  Day of Surgery:  Do not apply any deodorants/lotions.  Please wear clean clothes to the hospital/surgery center.   Remember to brush your teeth WITH YOUR REGULAR TOOTHPASTE.  Please read over the following fact sheets that you were given. Pain Booklet, Coughing and Deep  Breathing, MRSA Information and Surgical Site Infection Prevention

## 2018-03-05 ENCOUNTER — Encounter (HOSPITAL_COMMUNITY): Payer: Self-pay

## 2018-03-05 ENCOUNTER — Encounter (HOSPITAL_COMMUNITY)
Admission: RE | Admit: 2018-03-05 | Discharge: 2018-03-05 | Disposition: A | Discharge status: Home / Self Care | Payer: Medicare Other | Source: Ambulatory Visit | Attending: Neurosurgery | Admitting: Neurosurgery

## 2018-03-05 ENCOUNTER — Other Ambulatory Visit: Payer: Self-pay

## 2018-03-05 DIAGNOSIS — I69354 Hemiplegia and hemiparesis following cerebral infarction affecting left non-dominant side: Secondary | ICD-10-CM | POA: Diagnosis not present

## 2018-03-05 DIAGNOSIS — D649 Anemia, unspecified: Secondary | ICD-10-CM | POA: Diagnosis not present

## 2018-03-05 DIAGNOSIS — M5031 Other cervical disc degeneration,  high cervical region: Secondary | ICD-10-CM | POA: Diagnosis not present

## 2018-03-05 DIAGNOSIS — Z01812 Encounter for preprocedural laboratory examination: Secondary | ICD-10-CM | POA: Diagnosis not present

## 2018-03-05 DIAGNOSIS — I10 Essential (primary) hypertension: Secondary | ICD-10-CM | POA: Diagnosis not present

## 2018-03-05 DIAGNOSIS — M4802 Spinal stenosis, cervical region: Secondary | ICD-10-CM | POA: Diagnosis not present

## 2018-03-05 DIAGNOSIS — M1990 Unspecified osteoarthritis, unspecified site: Secondary | ICD-10-CM | POA: Diagnosis not present

## 2018-03-05 HISTORY — DX: Other spondylosis with myelopathy, cervical region: M47.22

## 2018-03-05 HISTORY — DX: Other spondylosis with myelopathy, cervical region: M47.12

## 2018-03-05 LAB — COMPREHENSIVE METABOLIC PANEL
ALT: 21 U/L (ref 17–63)
AST: 27 U/L (ref 15–41)
Albumin: 4.1 g/dL (ref 3.5–5.0)
Alkaline Phosphatase: 70 U/L (ref 38–126)
Anion gap: 7 (ref 5–15)
BUN: 14 mg/dL (ref 6–20)
CHLORIDE: 109 mmol/L (ref 101–111)
CO2: 27 mmol/L (ref 22–32)
CREATININE: 0.98 mg/dL (ref 0.61–1.24)
Calcium: 9.4 mg/dL (ref 8.9–10.3)
GFR calc Af Amer: >60 mL/min (ref >60)
GFR calc non Af Amer: >60 mL/min (ref >60)
Glucose, Bld: 113 mg/dL — ABNORMAL HIGH (ref 65–99)
Potassium: 4.4 mmol/L (ref 3.5–5.1)
SODIUM: 143 mmol/L (ref 135–145)
Total Bilirubin: 0.5 mg/dL (ref 0.3–1.2)
Total Protein: 7 g/dL (ref 6.5–8.1)

## 2018-03-05 LAB — CBC
HEMATOCRIT: 43.9 % (ref 39.0–52.0)
Hemoglobin: 14.7 g/dL (ref 13.0–17.0)
MCH: 29.5 pg (ref 26.0–34.0)
MCHC: 33.5 g/dL (ref 30.0–36.0)
MCV: 88 fL (ref 78.0–100.0)
PLATELETS: 185 10*3/uL (ref 150–400)
RBC: 4.99 MIL/uL (ref 4.22–5.81)
RDW: 14.5 % (ref 11.5–15.5)
WBC: 5.2 10*3/uL (ref 4.0–10.5)

## 2018-03-05 LAB — SURGICAL PCR SCREEN
MRSA, PCR: NEGATIVE
STAPHYLOCOCCUS AUREUS: NEGATIVE

## 2018-03-05 LAB — NO BLOOD PRODUCTS

## 2018-03-05 NOTE — Progress Notes (Signed)
PCP - NP Julianne HandlerLachina Hollis  Cardiologist - Denies  Chest x-ray - 02/05/18- 1-view (E)  EKG - 02/04/18 (E)  Stress Test - Denies  ECHO - Denies  Cardiac Cath - Denies  Sleep Study - Denies CPAP - None  LABS- 03/05/18: CBC, CMP  ASA- Denies   Anesthesia- No  Pt denies having chest pain, sob, or fever at this time. All instructions explained to the pt, with a verbal understanding of the material. Pt agrees to go over the instructions while at home for a better understanding. The opportunity to ask questions was provided.

## 2018-03-05 NOTE — Progress Notes (Signed)
Pt refused to have Type and Screen drawn, due to him refusing blood products, but will take Albumin. Therefore, lab order discontinued. Form signed and witnessed.

## 2018-03-11 ENCOUNTER — Ambulatory Visit (HOSPITAL_COMMUNITY): Payer: Medicare Other | Admitting: Certified Registered Nurse Anesthetist

## 2018-03-11 ENCOUNTER — Ambulatory Visit (HOSPITAL_COMMUNITY): Payer: Medicare Other

## 2018-03-11 ENCOUNTER — Observation Stay (HOSPITAL_COMMUNITY)
Admission: RE | Admit: 2018-03-11 | Discharge: 2018-03-12 | Disposition: A | Discharge status: Home / Self Care | Payer: Medicare Other | Source: Ambulatory Visit | Attending: Neurosurgery | Admitting: Neurosurgery

## 2018-03-11 ENCOUNTER — Encounter (HOSPITAL_COMMUNITY): Payer: Self-pay | Admitting: Critical Care Medicine

## 2018-03-11 ENCOUNTER — Encounter (HOSPITAL_COMMUNITY)
Admission: RE | Disposition: A | Discharge status: Home / Self Care | Payer: Self-pay | Source: Ambulatory Visit | Attending: Neurosurgery

## 2018-03-11 DIAGNOSIS — Z79899 Other long term (current) drug therapy: Secondary | ICD-10-CM | POA: Diagnosis not present

## 2018-03-11 DIAGNOSIS — Z888 Allergy status to other drugs, medicaments and biological substances status: Secondary | ICD-10-CM | POA: Insufficient documentation

## 2018-03-11 DIAGNOSIS — M4802 Spinal stenosis, cervical region: Secondary | ICD-10-CM | POA: Insufficient documentation

## 2018-03-11 DIAGNOSIS — I10 Essential (primary) hypertension: Secondary | ICD-10-CM | POA: Insufficient documentation

## 2018-03-11 DIAGNOSIS — Z833 Family history of diabetes mellitus: Secondary | ICD-10-CM | POA: Diagnosis not present

## 2018-03-11 DIAGNOSIS — Z809 Family history of malignant neoplasm, unspecified: Secondary | ICD-10-CM | POA: Insufficient documentation

## 2018-03-11 DIAGNOSIS — D62 Acute posthemorrhagic anemia: Secondary | ICD-10-CM | POA: Diagnosis not present

## 2018-03-11 DIAGNOSIS — Z8249 Family history of ischemic heart disease and other diseases of the circulatory system: Secondary | ICD-10-CM | POA: Insufficient documentation

## 2018-03-11 DIAGNOSIS — F17210 Nicotine dependence, cigarettes, uncomplicated: Secondary | ICD-10-CM | POA: Insufficient documentation

## 2018-03-11 DIAGNOSIS — M5001 Cervical disc disorder with myelopathy,  high cervical region: Secondary | ICD-10-CM | POA: Insufficient documentation

## 2018-03-11 DIAGNOSIS — M5020 Other cervical disc displacement, unspecified cervical region: Secondary | ICD-10-CM | POA: Diagnosis present

## 2018-03-11 DIAGNOSIS — M5011 Cervical disc disorder with radiculopathy,  high cervical region: Secondary | ICD-10-CM | POA: Diagnosis not present

## 2018-03-11 DIAGNOSIS — M5002 Cervical disc disorder with myelopathy, mid-cervical region, unspecified level: Secondary | ICD-10-CM | POA: Diagnosis not present

## 2018-03-11 DIAGNOSIS — M4322 Fusion of spine, cervical region: Secondary | ICD-10-CM | POA: Diagnosis not present

## 2018-03-11 DIAGNOSIS — Z419 Encounter for procedure for purposes other than remedying health state, unspecified: Secondary | ICD-10-CM

## 2018-03-11 HISTORY — PX: ANTERIOR CERVICAL DECOMP/DISCECTOMY FUSION: SHX1161

## 2018-03-11 SURGERY — ANTERIOR CERVICAL DECOMPRESSION/DISCECTOMY FUSION 1 LEVEL
Anesthesia: General

## 2018-03-11 MED ORDER — SUGAMMADEX SODIUM 200 MG/2ML IV SOLN
INTRAVENOUS | Status: DC | PRN
  Administered 2018-03-11: 150 mg via INTRAVENOUS

## 2018-03-11 MED ORDER — BACITRACIN ZINC 500 UNIT/GM EX OINT
TOPICAL_OINTMENT | CUTANEOUS | Status: AC
  Filled 2018-03-11: qty 28.35

## 2018-03-11 MED ORDER — AMLODIPINE BESYLATE 5 MG PO TABS
10.0000 mg | ORAL_TABLET | Freq: Every day | ORAL | Status: DC
  Administered 2018-03-11: 10 mg via ORAL
  Filled 2018-03-11: qty 2

## 2018-03-11 MED ORDER — THROMBIN 5000 UNITS EX SOLR
CUTANEOUS | Status: AC
  Filled 2018-03-11: qty 5000

## 2018-03-11 MED ORDER — BISACODYL 10 MG RE SUPP: 10.0000 mg | Freq: Every day | RECTAL | Status: DC | PRN

## 2018-03-11 MED ORDER — CEFAZOLIN SODIUM-DEXTROSE 2-4 GM/100ML-% IV SOLN
INTRAVENOUS | Status: AC
  Filled 2018-03-11: qty 100

## 2018-03-11 MED ORDER — ACETAMINOPHEN 500 MG PO TABS
1000.0000 mg | ORAL_TABLET | Freq: Four times a day (QID) | ORAL | Status: DC
  Administered 2018-03-11 – 2018-03-12 (×3): 1000 mg via ORAL
  Filled 2018-03-11 (×3): qty 2

## 2018-03-11 MED ORDER — ONDANSETRON HCL 4 MG/2ML IJ SOLN
INTRAMUSCULAR | Status: AC
  Filled 2018-03-11: qty 2

## 2018-03-11 MED ORDER — PHENYLEPHRINE HCL 10 MG/ML IJ SOLN
INTRAMUSCULAR | Status: AC
  Filled 2018-03-11: qty 1

## 2018-03-11 MED ORDER — MENTHOL 3 MG MT LOZG: 1.0000 | LOZENGE | OROMUCOSAL | Status: DC | PRN

## 2018-03-11 MED ORDER — MIDAZOLAM HCL 2 MG/2ML IJ SOLN
INTRAMUSCULAR | Status: AC
  Filled 2018-03-11: qty 2

## 2018-03-11 MED ORDER — PHENOL 1.4 % MT LIQD: 1.0000 | OROMUCOSAL | Status: DC | PRN

## 2018-03-11 MED ORDER — DEXTROSE 5 % IV SOLN
INTRAVENOUS | Status: DC | PRN
  Administered 2018-03-11: 25 ug/min via INTRAVENOUS

## 2018-03-11 MED ORDER — DEXAMETHASONE 4 MG PO TABS
4.0000 mg | ORAL_TABLET | Freq: Four times a day (QID) | ORAL | Status: AC
  Administered 2018-03-11 – 2018-03-12 (×2): 4 mg via ORAL
  Filled 2018-03-11 (×2): qty 1

## 2018-03-11 MED ORDER — PROPOFOL 10 MG/ML IV BOLUS
INTRAVENOUS | Status: DC | PRN
  Administered 2018-03-11: 200 mg via INTRAVENOUS

## 2018-03-11 MED ORDER — MORPHINE SULFATE (PF) 4 MG/ML IV SOLN: 4.0000 mg | INTRAVENOUS | Status: DC | PRN

## 2018-03-11 MED ORDER — LIDOCAINE 2% (20 MG/ML) 5 ML SYRINGE
INTRAMUSCULAR | Status: AC
  Filled 2018-03-11: qty 5

## 2018-03-11 MED ORDER — SUGAMMADEX SODIUM 200 MG/2ML IV SOLN
INTRAVENOUS | Status: AC
  Filled 2018-03-11: qty 2

## 2018-03-11 MED ORDER — DEXAMETHASONE SODIUM PHOSPHATE 4 MG/ML IJ SOLN: 4.0000 mg | Freq: Four times a day (QID) | INTRAMUSCULAR | Status: AC

## 2018-03-11 MED ORDER — FENTANYL CITRATE (PF) 250 MCG/5ML IJ SOLN
INTRAMUSCULAR | Status: DC | PRN
  Administered 2018-03-11 (×6): 50 ug via INTRAVENOUS

## 2018-03-11 MED ORDER — 0.9 % SODIUM CHLORIDE (POUR BTL) OPTIME
TOPICAL | Status: DC | PRN
  Administered 2018-03-11: 1000 mL

## 2018-03-11 MED ORDER — BUPIVACAINE-EPINEPHRINE (PF) 0.5% -1:200000 IJ SOLN
INTRAMUSCULAR | Status: AC
  Filled 2018-03-11: qty 30

## 2018-03-11 MED ORDER — ACETAMINOPHEN 325 MG PO TABS: 650.0000 mg | ORAL_TABLET | ORAL | Status: DC | PRN

## 2018-03-11 MED ORDER — HYDROCODONE-ACETAMINOPHEN 5-325 MG PO TABS: 1.0000 | ORAL_TABLET | ORAL | Status: DC | PRN

## 2018-03-11 MED ORDER — CEFAZOLIN SODIUM-DEXTROSE 2-4 GM/100ML-% IV SOLN
2.0000 g | INTRAVENOUS | Status: AC
  Administered 2018-03-11: 2 g via INTRAVENOUS

## 2018-03-11 MED ORDER — MIDAZOLAM HCL 5 MG/5ML IJ SOLN
INTRAMUSCULAR | Status: DC | PRN
  Administered 2018-03-11: 2 mg via INTRAVENOUS

## 2018-03-11 MED ORDER — ONDANSETRON HCL 4 MG/2ML IJ SOLN
INTRAMUSCULAR | Status: DC | PRN
  Administered 2018-03-11: 4 mg via INTRAVENOUS

## 2018-03-11 MED ORDER — CEFAZOLIN SODIUM-DEXTROSE 2-4 GM/100ML-% IV SOLN
2.0000 g | Freq: Three times a day (TID) | INTRAVENOUS | Status: AC
  Administered 2018-03-11 – 2018-03-12 (×2): 2 g via INTRAVENOUS
  Filled 2018-03-11 (×2): qty 100

## 2018-03-11 MED ORDER — HYDROMORPHONE HCL 2 MG/ML IJ SOLN
INTRAMUSCULAR | Status: AC
  Administered 2018-03-11: 0.5 mg via INTRAVENOUS
  Filled 2018-03-11: qty 1

## 2018-03-11 MED ORDER — DEXAMETHASONE SODIUM PHOSPHATE 10 MG/ML IJ SOLN
INTRAMUSCULAR | Status: AC
  Filled 2018-03-11: qty 1

## 2018-03-11 MED ORDER — ADULT MULTIVITAMIN W/MINERALS CH
1.0000 | ORAL_TABLET | Freq: Every day | ORAL | Status: DC
  Administered 2018-03-12: 1 via ORAL
  Filled 2018-03-11: qty 1

## 2018-03-11 MED ORDER — ROCURONIUM BROMIDE 50 MG/5ML IV SOLN
INTRAVENOUS | Status: AC
  Filled 2018-03-11: qty 1

## 2018-03-11 MED ORDER — DOCUSATE SODIUM 100 MG PO CAPS
100.0000 mg | ORAL_CAPSULE | Freq: Two times a day (BID) | ORAL | Status: DC
  Administered 2018-03-11 – 2018-03-12 (×2): 100 mg via ORAL
  Filled 2018-03-11 (×2): qty 1

## 2018-03-11 MED ORDER — PROPOFOL 10 MG/ML IV BOLUS
INTRAVENOUS | Status: AC
Start: 2018-03-11 — End: ?
  Filled 2018-03-11: qty 20

## 2018-03-11 MED ORDER — OXYCODONE HCL 5 MG PO TABS
10.0000 mg | ORAL_TABLET | ORAL | Status: DC | PRN
  Administered 2018-03-11 (×2): 10 mg via ORAL
  Filled 2018-03-11 (×2): qty 2

## 2018-03-11 MED ORDER — ALUM & MAG HYDROXIDE-SIMETH 200-200-20 MG/5ML PO SUSP: 30.0000 mL | Freq: Four times a day (QID) | ORAL | Status: DC | PRN

## 2018-03-11 MED ORDER — CHLORHEXIDINE GLUCONATE CLOTH 2 % EX PADS: 6.0000 | MEDICATED_PAD | Freq: Once | CUTANEOUS | Status: DC

## 2018-03-11 MED ORDER — LACTATED RINGERS IV SOLN: INTRAVENOUS | Status: DC

## 2018-03-11 MED ORDER — LABETALOL HCL 5 MG/ML IV SOLN
INTRAVENOUS | Status: DC | PRN
  Administered 2018-03-11: 10 mg via INTRAVENOUS

## 2018-03-11 MED ORDER — DEXAMETHASONE SODIUM PHOSPHATE 10 MG/ML IJ SOLN
INTRAMUSCULAR | Status: DC | PRN
  Administered 2018-03-11: 10 mg via INTRAVENOUS

## 2018-03-11 MED ORDER — ONDANSETRON HCL 4 MG PO TABS: 4.0000 mg | ORAL_TABLET | Freq: Four times a day (QID) | ORAL | Status: DC | PRN

## 2018-03-11 MED ORDER — LACTATED RINGERS IV SOLN
INTRAVENOUS | Status: DC
  Administered 2018-03-11: 10 mL/h via INTRAVENOUS

## 2018-03-11 MED ORDER — BACITRACIN ZINC 500 UNIT/GM EX OINT
TOPICAL_OINTMENT | CUTANEOUS | Status: DC | PRN
  Administered 2018-03-11: 1 via TOPICAL

## 2018-03-11 MED ORDER — EPHEDRINE SULFATE 50 MG/ML IJ SOLN
INTRAMUSCULAR | Status: AC
  Filled 2018-03-11: qty 1

## 2018-03-11 MED ORDER — THROMBIN 5000 UNITS EX SOLR
CUTANEOUS | Status: DC | PRN
  Administered 2018-03-11: 16:00:00 via TOPICAL

## 2018-03-11 MED ORDER — SODIUM CHLORIDE 0.9 % IV SOLN
INTRAVENOUS | Status: DC | PRN
  Administered 2018-03-11: 16:00:00

## 2018-03-11 MED ORDER — BUPIVACAINE-EPINEPHRINE (PF) 0.5% -1:200000 IJ SOLN
INTRAMUSCULAR | Status: DC | PRN
  Administered 2018-03-11: 10 mL via PERINEURAL

## 2018-03-11 MED ORDER — FENTANYL CITRATE (PF) 250 MCG/5ML IJ SOLN
INTRAMUSCULAR | Status: AC
  Filled 2018-03-11: qty 5

## 2018-03-11 MED ORDER — ONDANSETRON HCL 4 MG/2ML IJ SOLN: 4.0000 mg | Freq: Four times a day (QID) | INTRAMUSCULAR | Status: DC | PRN

## 2018-03-11 MED ORDER — HYDROMORPHONE HCL 2 MG/ML IJ SOLN
0.2500 mg | INTRAMUSCULAR | Status: DC | PRN
  Administered 2018-03-11 (×2): 0.5 mg via INTRAVENOUS

## 2018-03-11 MED ORDER — ACETAMINOPHEN 650 MG RE SUPP: 650.0000 mg | RECTAL | Status: DC | PRN

## 2018-03-11 MED ORDER — ROCURONIUM BROMIDE 10 MG/ML (PF) SYRINGE
PREFILLED_SYRINGE | INTRAVENOUS | Status: DC | PRN
  Administered 2018-03-11: 50 mg via INTRAVENOUS

## 2018-03-11 MED ORDER — OXYCODONE HCL 5 MG PO TABS
ORAL_TABLET | ORAL | Status: AC
  Administered 2018-03-11: 10 mg via ORAL
  Filled 2018-03-11: qty 2

## 2018-03-11 MED ORDER — LIDOCAINE 2% (20 MG/ML) 5 ML SYRINGE
INTRAMUSCULAR | Status: DC | PRN
  Administered 2018-03-11: 70 mg via INTRAVENOUS

## 2018-03-11 MED ORDER — OXYCODONE HCL 5 MG PO TABS: 5.0000 mg | ORAL_TABLET | ORAL | Status: DC | PRN

## 2018-03-11 SURGICAL SUPPLY — 61 items
APL SKNCLS STERI-STRIP NONHPOA (GAUZE/BANDAGES/DRESSINGS) ×1
BAG DECANTER FOR FLEXI CONT (MISCELLANEOUS) ×3 IMPLANT
BENZOIN TINCTURE PRP APPL 2/3 (GAUZE/BANDAGES/DRESSINGS) ×3 IMPLANT
BIT DRILL NEURO 2X3.1 SFT TUCH (MISCELLANEOUS) ×1 IMPLANT
BLADE SURG 15 STRL LF DISP TIS (BLADE) ×1 IMPLANT
BLADE SURG 15 STRL SS (BLADE) ×3
BLADE ULTRA TIP 2M (BLADE) ×3 IMPLANT
BUR BARREL STRAIGHT FLUTE 4.0 (BURR) ×5 IMPLANT
BUR MATCHSTICK NEURO 3.0 LAGG (BURR) ×3 IMPLANT
CANISTER SUCT 3000ML PPV (MISCELLANEOUS) ×3 IMPLANT
CARTRIDGE OIL MAESTRO DRILL (MISCELLANEOUS) ×1 IMPLANT
CLOSURE WOUND 1/2 X4 (GAUZE/BANDAGES/DRESSINGS) ×1
COVER MAYO STAND STRL (DRAPES) ×3 IMPLANT
DECANTER SPIKE VIAL GLASS SM (MISCELLANEOUS) ×3 IMPLANT
DIFFUSER DRILL AIR PNEUMATIC (MISCELLANEOUS) ×3 IMPLANT
DRAPE LAPAROTOMY 100X72 PEDS (DRAPES) ×3 IMPLANT
DRAPE MICROSCOPE LEICA (MISCELLANEOUS) IMPLANT
DRAPE POUCH INSTRU U-SHP 10X18 (DRAPES) ×1 IMPLANT
DRAPE SURG 17X23 STRL (DRAPES) ×6 IMPLANT
DRILL NEURO 2X3.1 SOFT TOUCH (MISCELLANEOUS) ×3
DRSG OPSITE 4X5.5 SM (GAUZE/BANDAGES/DRESSINGS) ×2 IMPLANT
DRSG OPSITE POSTOP 4X6 (GAUZE/BANDAGES/DRESSINGS) ×2 IMPLANT
ELECT REM PT RETURN 9FT ADLT (ELECTROSURGICAL) ×3
ELECTRODE REM PT RTRN 9FT ADLT (ELECTROSURGICAL) ×1 IMPLANT
GAUZE SPONGE 4X4 12PLY STRL (GAUZE/BANDAGES/DRESSINGS) ×3 IMPLANT
GAUZE SPONGE 4X4 16PLY XRAY LF (GAUZE/BANDAGES/DRESSINGS) IMPLANT
GLOVE BIO SURGEON STRL SZ8 (GLOVE) ×3 IMPLANT
GLOVE BIO SURGEON STRL SZ8.5 (GLOVE) ×3 IMPLANT
GLOVE BIOGEL PI IND STRL 6.5 (GLOVE) IMPLANT
GLOVE BIOGEL PI INDICATOR 6.5 (GLOVE) ×2
GLOVE EXAM NITRILE LRG STRL (GLOVE) IMPLANT
GLOVE EXAM NITRILE XL STR (GLOVE) IMPLANT
GLOVE EXAM NITRILE XS STR PU (GLOVE) IMPLANT
GOWN STRL REUS W/ TWL LRG LVL3 (GOWN DISPOSABLE) IMPLANT
GOWN STRL REUS W/ TWL XL LVL3 (GOWN DISPOSABLE) ×1 IMPLANT
GOWN STRL REUS W/TWL LRG LVL3 (GOWN DISPOSABLE)
GOWN STRL REUS W/TWL XL LVL3 (GOWN DISPOSABLE) ×3
HEMOSTAT POWDER KIT SURGIFOAM (HEMOSTASIS) ×3 IMPLANT
KIT BASIN OR (CUSTOM PROCEDURE TRAY) ×3 IMPLANT
KIT TURNOVER KIT B (KITS) ×3 IMPLANT
MARKER SKIN DUAL TIP RULER LAB (MISCELLANEOUS) ×3 IMPLANT
NDL SPNL 18GX3.5 QUINCKE PK (NEEDLE) ×1 IMPLANT
NEEDLE HYPO 22GX1.5 SAFETY (NEEDLE) ×3 IMPLANT
NEEDLE SPNL 18GX3.5 QUINCKE PK (NEEDLE) ×3 IMPLANT
NS IRRIG 1000ML POUR BTL (IV SOLUTION) ×3 IMPLANT
OIL CARTRIDGE MAESTRO DRILL (MISCELLANEOUS) ×3
PACK LAMINECTOMY NEURO (CUSTOM PROCEDURE TRAY) ×3 IMPLANT
PEEK VISTA 14X14X7MM (Peek) ×2 IMPLANT
PIN DISTRACTION 14MM (PIN) ×6 IMPLANT
PLATE ANT CERV XTEND ELD 1 L14 (Plate) ×2 IMPLANT
RUBBERBAND STERILE (MISCELLANEOUS) IMPLANT
SCREW XTD VAR 4.2 SELF TAP (Screw) ×8 IMPLANT
SPONGE INTESTINAL PEANUT (DISPOSABLE) ×6 IMPLANT
SPONGE SURGIFOAM ABS GEL SZ50 (HEMOSTASIS) IMPLANT
STRIP CLOSURE SKIN 1/2X4 (GAUZE/BANDAGES/DRESSINGS) ×2 IMPLANT
SUT VIC AB 0 CT1 27 (SUTURE) ×3
SUT VIC AB 0 CT1 27XBRD ANTBC (SUTURE) ×1 IMPLANT
SUT VIC AB 3-0 SH 8-18 (SUTURE) ×3 IMPLANT
TOWEL GREEN STERILE (TOWEL DISPOSABLE) ×3 IMPLANT
TOWEL GREEN STERILE FF (TOWEL DISPOSABLE) ×3 IMPLANT
WATER STERILE IRR 1000ML POUR (IV SOLUTION) ×3 IMPLANT

## 2018-03-11 NOTE — Transfer of Care (Signed)
Immediate Anesthesia Transfer of Care Note  Patient: Hedy CamaraVincent G Tumlin  Procedure(s) Performed: ANTERIOR CERVICAL DECOMPRESSION/DISCECTOMY FUSION, INTERBODY PROSTHESIS, PLATE/SCREWS CERVICAL THREE- CERVICAL FOUR (N/A )  Patient Location: PACU  Anesthesia Type:General  Level of Consciousness: awake, alert , oriented and patient cooperative  Airway & Oxygen Therapy: Patient Spontanous Breathing and Patient connected to face mask oxygen  Post-op Assessment: Report given to RN and Post -op Vital signs reviewed and stable  Post vital signs: Reviewed and stable  Last Vitals:  Vitals Value Taken Time  BP 157/104 03/11/2018  4:56 PM  Temp    Pulse 73 03/11/2018  5:00 PM  Resp 11 03/11/2018  5:00 PM  SpO2 100 % 03/11/2018  5:00 PM  Vitals shown include unvalidated device data.  Last Pain:  Vitals:   03/11/18 1231  TempSrc:   PainSc: 0-No pain      Patients Stated Pain Goal: 3 (03/11/18 1231)  Complications: No apparent anesthesia complications

## 2018-03-11 NOTE — Anesthesia Procedure Notes (Signed)
Procedure Name: Intubation Date/Time: 03/11/2018 3:12 PM Performed by: Wilburn Cornelia, CRNA Pre-anesthesia Checklist: Patient identified, Emergency Drugs available, Suction available, Patient being monitored and Timeout performed Patient Re-evaluated:Patient Re-evaluated prior to induction Oxygen Delivery Method: Circle system utilized Preoxygenation: Pre-oxygenation with 100% oxygen Induction Type: IV induction Ventilation: Mask ventilation without difficulty Laryngoscope Size: Mac and 4 Grade View: Grade II Tube type: Oral Tube size: 7.5 mm Number of attempts: 1 Airway Equipment and Method: Stylet Placement Confirmation: ETT inserted through vocal cords under direct vision,  positive ETCO2,  CO2 detector and breath sounds checked- equal and bilateral Secured at: 23 cm Tube secured with: Tape Dental Injury: Teeth and Oropharynx as per pre-operative assessment

## 2018-03-11 NOTE — Op Note (Signed)
Brief history: The patient is a 62 year old black male who has complained of neck and arm pain.  He has failed medical management.  He was worked up with a cervical MRI which demonstrated diffuse degenerative changes with significant stenosis and herniated disc at C3-4.  I discussed the various treatment option with the patient including surgery.  He has weighed the risks, benefits and alternatives surgery and decided to proceed with a C3-4 anterior shoulder discectomy, fusion and plating.  Preoperative diagnosis: C3-4 herniated disc, cervical spinal stenosis, cervical myelopathy, cervicalgia  Postoperative diagnosis: The same  Procedure: C3-4 anterior cervical discectomy/decompression; C3-4 interbody arthrodesis with local morcellized autograft bone and Kinnex bone graft extender; insertion of interbody prosthesis at C3-4 (Zimmer peek interbody prosthesis); anterior cervical plating from C3-4 with globus titanium plate  Surgeon: Dr. Delma OfficerJeff Rmani Kapusta  Asst.: Dr. Conchita ParisNundkumar  Anesthesia: Gen. endotracheal  Estimated blood loss: 75 cc  Drains: None  Complications: None  Description of procedure: The patient was brought to the operating room by the anesthesia team. General endotracheal anesthesia was induced. A roll was placed under the patient's shoulders to keep the neck in the neutral position. The patient's anterior cervical region was then prepared with Betadine scrub and Betadine solution. Sterile drapes were applied.  The area to be incised was then injected with Marcaine with epinephrine solution. I then used a scalpel to make a transverse incision in the patient's left anterior neck. I used the Metzenbaum scissors to divide the platysmal muscle and then to dissect medial to the sternocleidomastoid muscle, jugular vein, and carotid artery. I carefully dissected down towards the anterior cervical spine identifying the esophagus and retracting it medially. Then using Kitner swabs to clear soft  tissue from the anterior cervical spine. We then inserted a bent spinal needle into the upper exposed intervertebral disc space. We then obtained intraoperative radiographs confirm our location.  I then used electrocautery to detach the medial border of the longus colli muscle bilaterally from the C3-4 intervertebral disc spaces. I then inserted the Caspar self-retaining retractor underneath the longus colli muscle bilaterally to provide exposure.  We then incised the intervertebral disc at C3-4. We then performed a partial intervertebral discectomy with a pituitary forceps and the Karlin curettes. I then inserted distraction screws into the vertebral bodies at C3-4. We then distracted the interspace. We then used the high-speed drill to decorticate the vertebral endplates at C3-4, to drill away the remainder of the intervertebral disc, to drill away some posterior spondylosis, and to thin out the posterior longitudinal ligament. I then incised ligament with the arachnoid knife. We then removed the ligament with a Kerrison punches undercutting the vertebral endplates and decompressing the thecal sac. We then performed foraminotomies about the bilateral C4 nerve roots. This completed the decompression at this level.  We now turned our to attention to the interbody fusion. We used the trial spacers to determine the appropriate size for the interbody prosthesis. We then pre-filled prosthesis with a combination of local morcellized autograft bone that we obtained during decompression as well as Kinnex bone graft extender. We then inserted the prosthesis into the distracted interspace at C3-4. We then removed the distraction screws. There was a good snug fit of the prosthesis in the interspace.  Having completed the fusion we now turned attention to the anterior spinal instrumentation. We used the high-speed drill to drill away some anterior spondylosis at the disc spaces so that the plate lay down flat. We  selected the appropriate length titanium anterior  cervical plate. We laid it along the anterior aspect of the vertebral bodies from C3-4. We then drilled 14 mm holes at C3 and C4. We then secured the plate to the vertebral bodies by placing two 14 mm self-tapping screws at C3 and C4. We then obtained intraoperative radiograph. The demonstrating good position of the instrumentation. We therefore secured the screws the plate the locking each cam. This completed the instrumentation.  We then obtained hemostasis using bipolar electrocautery. We irrigated the wound out with bacitracin solution. We then removed the retractor. We inspected the esophagus for any damage. There was none apparent. We then reapproximated patient's platysmal muscle with interrupted 3-0 Vicryl suture. We then reapproximated the subcutaneous tissue with interrupted 3-0 Vicryl suture. The skin was reapproximated with Steri-Strips and benzoin. The wound was then covered with bacitracin ointment. A sterile dressing was applied. The drapes were removed. Patient was subsequently extubated by the anesthesia team and transported to the post anesthesia care unit in stable condition. All sponge instrument and needle counts were reportedly correct at the end of this case.

## 2018-03-11 NOTE — H&P (Signed)
Subjective: The patient is a 62 year old black male who has complained of back and neck pain.  He failed medical management.  He was worked up with a cervical MRI which demonstrated significant narrowing at C3-4.  I discussed the various treatment options with patient.  He has decided to proceed with a C3-4 anterior cervical discectomy, fusion and plating.  Past Medical History:  Diagnosis Date  . Cervical spondylosis with myelopathy and radiculopathy   . Chronic back pain    radiculopathy and stenosis  . Hypertension    takes AMlodipine daily  . Joint swelling    right knee  . Weakness    numbness in both legs when walking    Past Surgical History:  Procedure Laterality Date  . COLONOSCOPY    . KNEE ARTHROSCOPY Right 2011    Allergies  Allergen Reactions  . Flexeril [Cyclobenzaprine] Other (See Comments)    Gives him weird thoughts  . Other     NO BLOOD PRODUCTS. (pt will accept Albumin)    Social History   Tobacco Use  . Smoking status: Current Every Day Smoker    Packs/day: 0.50    Years: 20.00    Pack years: 10.00    Types: Cigarettes  . Smokeless tobacco: Never Used  . Tobacco comment: 3 packs a week  Substance Use Topics  . Alcohol use: Yes    Alcohol/week: 1.2 oz    Types: 2 Cans of beer per week    Comment: daily    Family History  Problem Relation Age of Onset  . Cancer Mother   . Heart disease Father   . Diabetes Brother    Prior to Admission medications   Medication Sig Start Date End Date Taking? Authorizing Provider  amLODipine (NORVASC) 10 MG tablet TAKE 1 TABLET BY MOUTH EVERY DAY. Patient taking differently: Take 10 mg by mouth daily.  12/25/17  Yes Massie Maroon, FNP  diclofenac sodium (VOLTAREN) 1 % GEL Apply 2 g topically 4 (four) times daily. Patient taking differently: Apply 2 g topically 4 (four) times daily as needed (pain).  01/30/17  Yes Massie Maroon, FNP  ibuprofen (ADVIL,MOTRIN) 200 MG tablet Take 200-400 mg by mouth daily as  needed for moderate pain.   Yes [provider]  meloxicam (MOBIC) 7.5 MG tablet Take 1 tablet (7.5 mg total) by mouth daily. 12/25/17  Yes Massie Maroon, FNP  Multiple Vitamin (MULTIVITAMIN WITH MINERALS) TABS tablet Take 1 tablet by mouth daily.   Yes [provider]  HYDROcodone-acetaminophen (NORCO/VICODIN) 5-325 MG tablet Take 1-2 tablets by mouth every 4 (four) hours as needed for moderate pain. Patient not taking: Reported on 02/26/2018 12/04/17   Sherren Mocha, MD     Review of Systems  Positive ROS: As above  All other systems have been reviewed and were otherwise negative with the exception of those mentioned in the HPI and as above.  Objective: Vital signs in last 24 hours: Temp:  [98.4 F (36.9 C)] 98.4 F (36.9 C) (06/17 1205) Pulse Rate:  [74] 74 (06/17 1205) Resp:  [18] 18 (06/17 1205) BP: (154)/(88) 154/88 (06/17 1205) SpO2:  [99 %] 99 % (06/17 1205) Weight:  [66.7 kg (147 lb)] 66.7 kg (147 lb) (06/17 1205) Estimated body mass index is 21.71 kg/m as calculated from the following:   Height as of this encounter: 5\' 9"  (1.753 m).   Weight as of this encounter: 66.7 kg (147 lb).   General Appearance: Alert Head: Normocephalic, without  obvious abnormality, atraumatic Eyes: PERRL, conjunctiva/corneas clear, EOM's intact,    Ears: Normal  Throat: Normal  Neck: Supple, Back: unremarkable Lungs: Clear to auscultation bilaterally, respirations unlabored Heart: Regular rate and rhythm, no murmur, rub or gallop Abdomen: Soft, non-tender Extremities: Extremities normal, atraumatic, no cyanosis or edema Skin: unremarkable  NEUROLOGIC:   Mental status: alert and oriented,Motor Exam - grossly normal Sensory Exam - grossly normal Reflexes:  Coordination - grossly normal Gait - grossly normal Balance - grossly normal Cranial Nerves: I: smell Not tested  II: visual acuity  OS: Normal  OD: Normal   II: visual fields Full to confrontation  II: pupils  Equal, round, reactive to light  III,VII: ptosis None  III,IV,VI: extraocular muscles  Full ROM  V: mastication Normal  V: facial light touch sensation  Normal  V,VII: corneal reflex  Present  VII: facial muscle function - upper  Normal  VII: facial muscle function - lower Normal  VIII: hearing Not tested  IX: soft palate elevation  Normal  IX,X: gag reflex Present  XI: trapezius strength  5/5  XI: sternocleidomastoid strength 5/5  XI: neck flexion strength  5/5  XII: tongue strength  Normal    Data Review Lab Results  Component Value Date   WBC 5.2 03/05/2018   HGB 14.7 03/05/2018   HCT 43.9 03/05/2018   MCV 88.0 03/05/2018   PLT 185 03/05/2018   Lab Results  Component Value Date   NA 143 03/05/2018   K 4.4 03/05/2018   CL 109 03/05/2018   CO2 27 03/05/2018   BUN 14 03/05/2018   CREATININE 0.98 03/05/2018   GLUCOSE 113 (H) 03/05/2018   Lab Results  Component Value Date   INR 0.94 02/04/2018    Assessment/Plan: C3-4 disc degeneration, spondylosis, stenosis, cervicalgia, cervical myelopathy: I have discussed the situation with the patient.  I have reviewed his imaging studies with him and pointed out the abnormalities.  We have discussed the various treatment options including surgery.  I have described the surgical treatment option of a C3-4 anterior cervical discectomy, fusion and plating.  I have shown him surgical models.  I have given him a surgical pamphlet.  We have discussed the risks, benefits, alternatives, expected postoperative course, and likelihood of achieving our goals with surgery.  I have answered all his questions.  He has decided to proceed with surgery.   Cristi LoronJeffrey D Stefanee Mckell 03/11/2018 3:01 PM

## 2018-03-11 NOTE — Progress Notes (Signed)
Subjective:  The patient is alert and pleasant. He is in no apparent distress.  Objective: Vital signs in last 24 hours: Temp:  [97.9 F (36.6 C)-98.4 F (36.9 C)] 97.9 F (36.6 C) (06/17 1655) Pulse Rate:  [71-77] 71 (06/17 1725) Resp:  [15-21] 15 (06/17 1725) BP: (154-173)/(88-103) 173/98 (06/17 1725) SpO2:  [97 %-100 %] 100 % (06/17 1725) Weight:  [66.7 kg (147 lb)] 66.7 kg (147 lb) (06/17 1205) Estimated body mass index is 21.71 kg/m as calculated from the following:   Height as of this encounter: 5\' 9"  (1.753 m).   Weight as of this encounter: 66.7 kg (147 lb).   Intake/Output from previous day: No intake/output data recorded. Intake/Output this shift: Total I/O In: 1000 [I.V.:1000] Out: 50 [Blood:50]  Physical exam patient is alert and pleasant. His dressing is clean and dry. There is no hematoma or shift. He is moving all 4 extremities well.  Lab Results: No results for input(s): WBC, HGB, HCT, PLT in the last 72 hours. BMET No results for input(s): NA, K, CL, CO2, GLUCOSE, BUN, CREATININE, CALCIUM in the last 72 hours.  Studies/Results: No results found.  Assessment/Plan: The patient is doing well. I spoke with his wife. He would likely go home tomorrow.  LOS: 0 days     Cristi LoronJeffrey D Kesia Dalto 03/11/2018, 5:29 PM

## 2018-03-11 NOTE — Anesthesia Preprocedure Evaluation (Addendum)
Anesthesia Evaluation  Patient identified by MRN, date of birth, ID band Patient awake    Reviewed: Allergy & Precautions, H&P , NPO status , Patient's Chart, lab work & pertinent test results  Airway Mallampati: II  TM Distance: >3 FB Neck ROM: Full    Dental no notable dental hx. (+) Teeth Intact, Dental Advisory Given   Pulmonary Current Smoker,    Pulmonary exam normal breath sounds clear to auscultation       Cardiovascular hypertension, Pt. on medications  Rhythm:Regular Rate:Normal     Neuro/Psych negative neurological ROS  negative psych ROS   GI/Hepatic negative GI ROS, Neg liver ROS,   Endo/Other  negative endocrine ROS  Renal/GU negative Renal ROS  negative genitourinary   Musculoskeletal   Abdominal   Peds  Hematology negative hematology ROS (+)   Anesthesia Other Findings   Reproductive/Obstetrics negative OB ROS                            Anesthesia Physical Anesthesia Plan  ASA: II  Anesthesia Plan: General   Post-op Pain Management:    Induction: Intravenous  PONV Risk Score and Plan: 2 and Ondansetron, Dexamethasone and Midazolam  Airway Management Planned: Oral ETT  Additional Equipment:   Intra-op Plan:   Post-operative Plan: Extubation in OR  Informed Consent: I have reviewed the patients History and Physical, chart, labs and discussed the procedure including the risks, benefits and alternatives for the proposed anesthesia with the patient or authorized representative who has indicated his/her understanding and acceptance.   Dental advisory given  Plan Discussed with: CRNA  Anesthesia Plan Comments:         Anesthesia Quick Evaluation

## 2018-03-11 NOTE — Plan of Care (Signed)

## 2018-03-12 ENCOUNTER — Other Ambulatory Visit: Payer: Self-pay

## 2018-03-12 DIAGNOSIS — M5011 Cervical disc disorder with radiculopathy,  high cervical region: Secondary | ICD-10-CM | POA: Diagnosis not present

## 2018-03-12 DIAGNOSIS — M5001 Cervical disc disorder with myelopathy,  high cervical region: Secondary | ICD-10-CM | POA: Diagnosis not present

## 2018-03-12 DIAGNOSIS — Z888 Allergy status to other drugs, medicaments and biological substances status: Secondary | ICD-10-CM | POA: Diagnosis not present

## 2018-03-12 DIAGNOSIS — M4802 Spinal stenosis, cervical region: Secondary | ICD-10-CM | POA: Diagnosis not present

## 2018-03-12 DIAGNOSIS — F17210 Nicotine dependence, cigarettes, uncomplicated: Secondary | ICD-10-CM | POA: Diagnosis not present

## 2018-03-12 DIAGNOSIS — I10 Essential (primary) hypertension: Secondary | ICD-10-CM | POA: Diagnosis not present

## 2018-03-12 MED ORDER — OXYCODONE HCL 5 MG PO TABS: 5.0000 mg | ORAL_TABLET | ORAL | 0 refills | Status: DC | PRN

## 2018-03-12 MED ORDER — DOCUSATE SODIUM 100 MG PO CAPS: 100.0000 mg | ORAL_CAPSULE | Freq: Two times a day (BID) | ORAL | 0 refills | Status: DC

## 2018-03-12 NOTE — Anesthesia Postprocedure Evaluation (Signed)
Anesthesia Post Note  Patient: David CamaraVincent G Debrosse  Procedure(s) Performed: ANTERIOR CERVICAL DECOMPRESSION/DISCECTOMY FUSION, INTERBODY PROSTHESIS, PLATE/SCREWS CERVICAL THREE- CERVICAL FOUR (N/A )     Patient location during evaluation: PACU Anesthesia Type: General Level of consciousness: awake and alert Pain management: pain level controlled Vital Signs Assessment: post-procedure vital signs reviewed and stable Respiratory status: spontaneous breathing, nonlabored ventilation and respiratory function stable Cardiovascular status: blood pressure returned to baseline and stable Postop Assessment: no apparent nausea or vomiting Anesthetic complications: no    Last Vitals:  Vitals:   03/12/18 0514 03/12/18 0727  BP: 134/84 (!) 144/93  Pulse: 72 89  Resp: 18 18  Temp: 36.7 C 37 C  SpO2: 98% 98%    Last Pain:  Vitals:   03/12/18 0916  TempSrc:   PainSc: 2                  Jaryan Chicoine,W. EDMOND

## 2018-03-12 NOTE — Progress Notes (Signed)
Occupational Therapy Evaluation Patient Details Name: AIDDEN MARKOVIC MRN: [id number] DOB: 1956-01-25 Today's Date: 03/12/2018   PTA, pt was independent with single crutch for ADL and functional mobility. He is limited by cervical pain, decreased adherence to cervical precautions, increased tone, and decreased motor control of RUE. He requires min guard assist for toilet and tub/shower transfers and supervision for LB ADL at this time. Educated pt concerning cervical precautions related to ADL as well as compensatory ADL strategies and he verbalizes understanding but requires consistent cues to adhere to these. OT will continue to follow while admitted.     03/12/18 0823  OT Visit Information  Last OT Received On 03/12/18  Assistance Needed +1  History of Present Illness Pt is a 62 y.o. male s/p C3-4 ACDF. He has a PMH significant for cervical spondylosis with myelopathy and radiculopathy, chronic back pain, hypertension, joint swelling in R knee, and weakness.   Precautions  Precautions Cervical  Precaution Booklet Issued Yes (comment)  Precaution Comments Reviewed cervical precautions with pt and educated concerning brace wear schedule and methods to don/doff.  Restrictions  Weight Bearing Restrictions No  Home Living  Family/patient expects to be discharged to: Private residence  Living Arrangements Spouse/significant other  Available Help at Discharge Family;Available 24 hours/day  Type of Home Apartment  Home Access Stairs to enter  Entrance Stairs-Number of Steps 2 flights  Entrance Stairs-Rails Right;Left  Home Layout One level  Bathroom Environmental health practitioner Crutches  Prior Function  Level of Independence Independent with assistive device(s)  Comments Has been ambulating with single crutch.   Communication  Communication No difficulties  Pain Assessment  Pain Assessment Faces  Faces Pain Scale 2  Pain Location incision  site  Pain Descriptors / Indicators Aching;Sore  Pain Intervention(s) Limited activity within patient's tolerance;Monitored during session;Repositioned  Cognition  Arousal/Alertness Awake/alert  Behavior During Therapy WFL for tasks assessed/performed  Overall Cognitive Status Within Functional Limits for tasks assessed  Upper Extremity Assessment  Upper Extremity Assessment LUE deficits/detail;RUE deficits/detail  RUE Deficits / Details AROM and strength functional and reports improving sensation but continues to have some tingling.   LUE Deficits / Details Noted increased tone limiting ROM as well as decreased coordination and motor control at all joints. Limited digit extension (lacking 25%) and decreased light touch sensation.   LUE Sensation decreased light touch  LUE Coordination decreased fine motor;decreased gross motor  Lower Extremity Assessment  Lower Extremity Assessment Defer to PT evaluation  ADL  Overall ADL's  Needs assistance/impaired  Eating/Feeding Set up;Sitting  Grooming Supervision/safety;Standing  Upper Body Bathing Set up;Sitting  Lower Body Bathing Min guard;Cueing for sequencing;Sit to/from stand  Upper Body Dressing  Set up;Sitting  Lower Body Dressing Min guard;Sit to/from Photographer Details (indicate cue type and reason) Noted to reach for furniture during session.   Toileting- Horticulturist, commercial;Sit to/from stand  Tub/ [employer] guard;Ambulation;Tub transfer  Functional mobility during ADLs Min guard  General ADL Comments Educated pt concerning cervical precautions as well as compensatory ADL strategies. Pt requiring cues throughout for all ADL tasks to adhere to precautions. On my arrival, pt without cervical collar and educated on need to wear brace unless in shower. He verbalizes understanding.   Vision- History  Baseline Vision/History Wears glasses  Patient  Visual Report No change from baseline  Vision- Assessment  Vision Assessment? No apparent visual deficits  Bed Mobility  Overal bed mobility Needs Assistance  Bed Mobility Rolling;Sidelying to Sit  Rolling Supervision  Sidelying to sit Supervision  General bed mobility comments Supervision for safety with cues to adhere to precautions and complete log roll.   Transfers  Overall transfer level Needs assistance  Equipment used None  Transfers Sit to/from Stand  Sit to Stand Min guard  General transfer comment Min guard assist for safety.   Balance  Overall balance assessment Mild deficits observed, not formally tested  General Comments  General comments (skin integrity, edema, etc.) Assisted pt to adjust cervical collar for proper fit.   OT - End of Session  Equipment Utilized During Treatment Gait belt;Rolling walker  Activity Tolerance Patient tolerated treatment well  Patient left in chair;with call bell/phone within reach  Nurse Communication Mobility status  OT Assessment  OT Recommendation/Assessment Patient needs continued OT Services  OT Visit Diagnosis Other abnormalities of gait and mobility (R26.89);Pain  Pain - part of body  (cervical)  OT Problem List Decreased strength;Decreased range of motion;Impaired balance (sitting and/or standing);Decreased safety awareness;Pain;Decreased knowledge of use of DME or AE;Decreased knowledge of precautions;Decreased activity tolerance  OT Plan  OT Frequency (ACUTE ONLY) Min 2X/week  OT Treatment/Interventions (ACUTE ONLY) Self-care/ADL training;Therapeutic exercise;Energy conservation;DME and/or AE instruction;Therapeutic activities;Patient/family education;Balance training  AM-PAC OT "6 Clicks" Daily Activity Outcome Measure  Help from another person eating meals? 4  Help from another person taking care of personal grooming? 3  Help from another person toileting, which includes using toliet, bedpan, or urinal? 3  Help from another  person bathing (including washing, rinsing, drying)? 3  Help from another person to put on and taking off regular upper body clothing? 3  Help from another person to put on and taking off regular lower body clothing? 3  6 Click Score 19  ADL G Code Conversion CK  OT Recommendation  Follow Up Recommendations Supervision/Assistance - 24 hour;No OT follow up  OT Equipment None recommended by OT  Individuals Consulted  Consulted and Agree with Results and Recommendations Patient  Acute Rehab OT Goals  Patient Stated Goal to go home today  OT Goal Formulation With patient  Time For Goal Achievement 03/26/18  Potential to Achieve Goals Good  OT Time Calculation  OT Start Time (ACUTE ONLY) 0807  OT Stop Time (ACUTE ONLY) 0820  OT Time Calculation (min) 13 min  OT General Charges  $OT Visit 1 Visit  OT Evaluation  $OT Eval Moderate Complexity 1 Mod  Written Expression  Dominant Hand Right   Doristine Sectionharity A Ayaka Andes, MS OTR/L  Pager: [phone]

## 2018-03-12 NOTE — Progress Notes (Signed)
Patient is discharged from room 3C07 at this time. Alert and in stable condition. IV site d/c'd and instructions read to patient with understanding verbalized. Left unit via wheelchair with all belongings at side. 

## 2018-03-12 NOTE — Discharge Summary (Signed)
Physician Discharge Summary  Patient ID: David Keith MRN: [id number] DOB/AGE: Nov 25, 1955 62 y.o.  Admit date: 03/11/2018 Discharge date: 03/12/2018  Admission Diagnoses: C3-4 herniated disc, disc degeneration, spondylosis, stenosis, cervical myelopathy, cervicalgia, cervical radiculopathy Discharge Diagnoses: The same Active Problems:   Cervical herniated disc   Discharged Condition: good  Hospital Course: I performed a C3-4 anterior cervical discectomy, fusion and plating on the patient on 03/11/2018.  The surgery went well.  The patient's postoperative course was unremarkable.  On postoperative day #1 he requested discharge home.  He was given written and oral discharge instructions.  All his questions were answered.  Consults: Physical therapy Significant Diagnostic Studies: None Treatments: C3-4 anterior cervical discectomy, fusion and plating Discharge Exam: Blood pressure (!) 144/93, pulse 89, temperature 98.6 F (37 C), temperature source Oral, resp. rate 18, height 5\' 9"  (1.753 m), weight 66.7 kg (147 lb), SpO2 98 %. The patient is alert and pleasant.  He looks well.  His dressing is clean and dry.  There is no hematoma or shift.  His strength is normal in all 4 extremities.  Disposition: Home  Discharge Instructions    Call MD for:  difficulty breathing, headache or visual disturbances   Complete by:  As directed    Call MD for:  extreme fatigue   Complete by:  As directed    Call MD for:  hives   Complete by:  As directed    Call MD for:  persistant dizziness or light-headedness   Complete by:  As directed    Call MD for:  persistant nausea and vomiting   Complete by:  As directed    Call MD for:  redness, tenderness, or signs of infection (pain, swelling, redness, odor or green/yellow discharge around incision site)   Complete by:  As directed    Call MD for:  severe uncontrolled pain   Complete by:  As directed    Call MD for:  temperature >100.4   Complete  by:  As directed    Diet - low sodium heart healthy   Complete by:  As directed    Discharge instructions   Complete by:  As directed    Call [phone] for a followup appointment. Take a stool softener while you are using pain medications.   Driving Restrictions   Complete by:  As directed    Do not drive for 2 weeks.   Increase activity slowly   Complete by:  As directed    Lifting restrictions   Complete by:  As directed    Do not lift more than 5 pounds. No excessive bending or twisting.   May shower / Bathe   Complete by:  As directed    Remove the dressing for 3 days after surgery.  You may shower, but leave the incision alone.   Remove dressing in 48 hours   Complete by:  As directed    Your stitches are under the scan and will dissolve by themselves. The Steri-Strips will fall off after you take a few showers. Do not rub back or pick at the wound, Leave the wound alone.     Allergies as of 03/12/2018      Reactions   Flexeril [cyclobenzaprine] Other (See Comments)   Gives him weird thoughts   Other    NO BLOOD PRODUCTS. (pt will accept Albumin)      Medication List    STOP taking these medications   diclofenac sodium 1 % Gel Commonly known as:  VOLTAREN   HYDROcodone-acetaminophen 5-325 MG tablet Commonly known as:  NORCO/VICODIN   ibuprofen 200 MG tablet Commonly known as:  ADVIL,MOTRIN   meloxicam 7.5 MG tablet Commonly known as:  MOBIC     TAKE these medications   amLODipine 10 MG tablet Commonly known as:  NORVASC TAKE 1 TABLET BY MOUTH EVERY DAY. What changed:    how much to take  how to take this  when to take this  additional instructions   docusate sodium 100 MG capsule Commonly known as:  COLACE Take 1 capsule (100 mg total) by mouth 2 (two) times daily.   multivitamin with minerals Tabs tablet Take 1 tablet by mouth daily.   oxyCODONE 5 MG immediate release tablet Commonly known as:  Oxy IR/ROXICODONE Take 1 tablet (5 mg total)  by mouth every 4 (four) hours as needed for moderate pain ((score 4 to 6)).        Signed: Cristi LoronJeffrey D Shasha Buchbinder 03/12/2018, 8:55 AM

## 2018-03-12 NOTE — Evaluation (Signed)
Physical Therapy Evaluation Patient Details Name: David CamaraVincent G Keith MRN: [id number] DOB: Oct 09, 1955 Today's Date: 03/12/2018   History of Present Illness  Pt is a 62 y.o. male s/p C3-4 ACDF. He has a PMH significant for cervical spondylosis with myelopathy and radiculopathy, chronic back pain, hypertension, joint swelling in R knee, and weakness.   Clinical Impression  Patient is s/p above surgery resulting in the deficits listed below (see PT Problem List). Pt functioning at supervision level. Patient will benefit from skilled PT to increase their independence and safety with mobility (while adhering to their precautions) to allow discharge to the venue listed below.     Follow Up Recommendations No PT follow up;Supervision - Intermittent    Equipment Recommendations  None recommended by PT(cont. use of singe crutch)    Recommendations for Other Services       Precautions / Restrictions Precautions Precautions: Cervical Precaution Booklet Issued: Yes (comment) Precaution Comments: Reviewed cervical precautions with pt and educated concerning brace wear schedule and methods to don/doff. Restrictions Weight Bearing Restrictions: No      Mobility  Bed Mobility Overal bed mobility: Modified Independent             General bed mobility comments: v/c's to not rotate, to maintain spinal alignment and complete log roll  Transfers Overall transfer level: Needs assistance Equipment used: None Transfers: Sit to/from Stand Sit to Stand: Min guard         General transfer comment: pt impulsively fast, didn't use AD at first then used cane, v/c's to minimize trunk flexion  Ambulation/Gait Ambulation/Gait assistance: Min guard Gait Distance (Feet): 200 Feet Assistive device: Straight cane(to mimic single crutch) Gait Pattern/deviations: Step-through pattern;Decreased stride length;Wide base of support(decreased step heigth) Gait velocity: wfl Gait velocity interpretation:  >2.62 ft/sec, indicative of community ambulatory General Gait Details: pt bow-legged and with R side weakness. Pt uses unilateral crutch at baseline. Pt attempted to amb without AD, pt reaching for wall and furniture to maintain balance. Pt given straight cane. Pt much more steady. Pt with good sequencing of cane more stable/steady.  Stairs Stairs: Yes Stairs assistance: Min guard   Number of Stairs: 24 General stair comments: used L hand rail and cane in R. verbal cues for step to gait pattern and proper cane sequencing to improve safety and decrease fall risk due to pt's impulsivity and trying to pull self up the stairs with bilat HR  Wheelchair Mobility    Modified Rankin (Stroke Patients Only)       Balance Overall balance assessment: Mild deficits observed, not formally tested                                           Pertinent Vitals/Pain Pain Assessment: 0-10 Pain Score: 2  Faces Pain Scale: Hurts a little bit Pain Location: incision site Pain Descriptors / Indicators: Aching;Sore Pain Intervention(s): Monitored during session    Home Living Family/patient expects to be discharged to:: Private residence Living Arrangements: Spouse/significant other Available Help at Discharge: Family;Available 24 hours/day Type of Home: Apartment Home Access: Stairs to enter Entrance Stairs-Rails: Right;Left Entrance Stairs-Number of Steps: 2 flights Home Layout: One level Home Equipment: Crutches      Prior Function Level of Independence: Independent with assistive device(s)         Comments: Has been ambulating with single crutch.      Hand Dominance  Dominant Hand: Right    Extremity/Trunk Assessment   Upper Extremity Assessment Upper Extremity Assessment: Defer to OT evaluation RUE Deficits / Details: AROM and strength functional and reports improving sensation but continues to have some tingling.  LUE Deficits / Details: Noted increased tone  limiting ROM as well as decreased coordination and motor control at all joints. Limited digit extension (lacking 25%) and decreased light touch sensation.  LUE Sensation: decreased light touch LUE Coordination: decreased fine motor;decreased gross motor    Lower Extremity Assessment Lower Extremity Assessment: Generalized weakness(R weaker than L, pt reports R knee buckling with fatigue)    Cervical / Trunk Assessment Cervical / Trunk Assessment: Other exceptions Cervical / Trunk Exceptions: recent neck surgery  Communication   Communication: No difficulties  Cognition Arousal/Alertness: Awake/alert Behavior During Therapy: Impulsive Overall Cognitive Status: Within Functional Limits for tasks assessed                                 General Comments: pt ready to leave and quick to move      General Comments General comments (skin integrity, edema, etc.): cervical collar optimally fitted due to being to loose and pt rotating neck in brace    Exercises     Assessment/Plan    PT Assessment Patient needs continued PT services  PT Problem List Decreased strength;Decreased activity tolerance;Decreased balance;Decreased mobility;Decreased coordination;Decreased knowledge of use of DME;Decreased safety awareness       PT Treatment Interventions DME instruction;Gait training;Stair training;Functional mobility training;Therapeutic activities;Therapeutic exercise;Balance training    PT Goals (Current goals can be found in the Care Plan section)  Acute Rehab PT Goals Patient Stated Goal: home PT Goal Formulation: With patient Time For Goal Achievement: 03/26/18 Potential to Achieve Goals: Good    Frequency Min 5X/week   Barriers to discharge        Co-evaluation               AM-PAC PT "6 Clicks" Daily Activity  Outcome Measure Difficulty turning over in bed (including adjusting bedclothes, sheets and blankets)?: None Difficulty moving from lying on  back to sitting on the side of the bed? : None Difficulty sitting down on and standing up from a chair with arms (e.g., wheelchair, bedside commode, etc,.)?: None Help needed moving to and from a bed to chair (including a wheelchair)?: A Little Help needed walking in hospital room?: A Little Help needed climbing 3-5 steps with a railing? : A Little 6 Click Score: 21    End of Session Equipment Utilized During Treatment: Gait belt Activity Tolerance: Patient tolerated treatment well Patient left: in bed;with call bell/phone within reach(sitting EOB) Nurse Communication: Mobility status PT Visit Diagnosis: Unsteadiness on feet (R26.81)    Time: 4098-1191 PT Time Calculation (min) (ACUTE ONLY): 14 min   Charges:   PT Evaluation $PT Eval Low Complexity: 1 Low     PT G CodesLewis Shock, PT, DPT Pager #: [phone] Office #: [phone]   David Keith 03/12/2018, 9:27 AM

## 2018-03-14 DIAGNOSIS — M1990 Unspecified osteoarthritis, unspecified site: Secondary | ICD-10-CM | POA: Diagnosis not present

## 2018-03-14 DIAGNOSIS — D649 Anemia, unspecified: Secondary | ICD-10-CM | POA: Diagnosis not present

## 2018-03-14 DIAGNOSIS — M5031 Other cervical disc degeneration,  high cervical region: Secondary | ICD-10-CM | POA: Diagnosis not present

## 2018-03-14 DIAGNOSIS — I69354 Hemiplegia and hemiparesis following cerebral infarction affecting left non-dominant side: Secondary | ICD-10-CM | POA: Diagnosis not present

## 2018-03-14 DIAGNOSIS — M4802 Spinal stenosis, cervical region: Secondary | ICD-10-CM | POA: Diagnosis not present

## 2018-03-14 DIAGNOSIS — I10 Essential (primary) hypertension: Secondary | ICD-10-CM | POA: Diagnosis not present

## 2018-03-15 ENCOUNTER — Encounter (HOSPITAL_COMMUNITY): Payer: Self-pay | Admitting: Neurosurgery

## 2018-04-16 DIAGNOSIS — G8192 Hemiplegia, unspecified affecting left dominant side: Secondary | ICD-10-CM | POA: Diagnosis not present

## 2018-06-04 ENCOUNTER — Ambulatory Visit (INDEPENDENT_AMBULATORY_CARE_PROVIDER_SITE_OTHER): Payer: Medicare Other

## 2018-06-04 ENCOUNTER — Ambulatory Visit (INDEPENDENT_AMBULATORY_CARE_PROVIDER_SITE_OTHER): Payer: Medicare Other | Admitting: Urgent Care

## 2018-06-04 ENCOUNTER — Encounter: Payer: Self-pay | Admitting: Urgent Care

## 2018-06-04 VITALS — BP 146/88 | HR 90 | Temp 98.6°F | Resp 16 | Ht 69.0 in | Wt 144.0 lb

## 2018-06-04 DIAGNOSIS — W19XXXA Unspecified fall, initial encounter: Secondary | ICD-10-CM

## 2018-06-04 DIAGNOSIS — M546 Pain in thoracic spine: Secondary | ICD-10-CM

## 2018-06-04 DIAGNOSIS — S299XXA Unspecified injury of thorax, initial encounter: Secondary | ICD-10-CM | POA: Diagnosis not present

## 2018-06-04 DIAGNOSIS — I10 Essential (primary) hypertension: Secondary | ICD-10-CM

## 2018-06-04 DIAGNOSIS — M549 Dorsalgia, unspecified: Secondary | ICD-10-CM | POA: Diagnosis not present

## 2018-06-04 DIAGNOSIS — R0781 Pleurodynia: Secondary | ICD-10-CM

## 2018-06-04 DIAGNOSIS — S2231XA Fracture of one rib, right side, initial encounter for closed fracture: Secondary | ICD-10-CM | POA: Diagnosis not present

## 2018-06-04 DIAGNOSIS — R0789 Other chest pain: Secondary | ICD-10-CM

## 2018-06-04 DIAGNOSIS — R030 Elevated blood-pressure reading, without diagnosis of hypertension: Secondary | ICD-10-CM

## 2018-06-04 DIAGNOSIS — M62838 Other muscle spasm: Secondary | ICD-10-CM | POA: Diagnosis not present

## 2018-06-04 MED ORDER — METHOCARBAMOL 500 MG PO TABS: 500.0000 mg | ORAL_TABLET | Freq: Three times a day (TID) | ORAL | 0 refills | Status: DC | PRN

## 2018-06-04 MED ORDER — MELOXICAM 7.5 MG PO TABS: 7.5000 mg | ORAL_TABLET | Freq: Every day | ORAL | 0 refills | Status: DC

## 2018-06-04 NOTE — Progress Notes (Signed)
    MRN: [id number] DOB: August 16, 1956  Subjective:   David Keith is a 62 y.o. male presenting for 5 day history of right sided chest wall, flank and thoracic back pain s/p fall onto concrete floor 1 week ago. Has stiffness of his back, difficulty laying on his back. Denies fever, shob, chest pain. Has been using oxycodone, hydrocodone. Has previously used ibuprofen and meloxicam well.  reports that he has been smoking cigarettes. He has a 10.00 pack-year smoking history. He has never used smokeless tobacco. He reports that he drinks about 2.0 standard drinks of alcohol per week. He reports that he has current or past drug history. Drug: Marijuana.   David Keith has a current medication list which includes the following prescription(s): amlodipine, docusate sodium, multivitamin with minerals, and oxycodone. Also is allergic to flexeril [cyclobenzaprine] and other.  David Keith  has a past medical history of Cervical spondylosis with myelopathy and radiculopathy, Chronic back pain, Hypertension, Joint swelling, and Weakness. Also  has a past surgical history that includes Knee arthroscopy (Right, 2011); Colonoscopy; and Anterior cervical decomp/discectomy fusion (N/A, 03/11/2018).  Objective:   Vitals: BP (!) 146/88   Pulse 90   Temp 98.6 F (37 C) (Oral)   Resp 16   Ht 5\' 9"  (1.753 m)   Wt 144 lb (65.3 kg)   SpO2 97%   BMI 21.27 kg/m   BP Readings from Last 3 Encounters:  06/04/18 (!) 146/88  03/12/18 (!) 144/93  03/05/18 (!) 157/94   Physical Exam  Constitutional: He is oriented to person, place, and time. He appears well-developed and well-nourished.  Cardiovascular: Normal rate, regular rhythm, normal heart sounds and intact distal pulses. Exam reveals no gallop and no friction rub.  No murmur heard. Pulmonary/Chest: Effort normal and breath sounds normal. No stridor. No respiratory distress. He has no wheezes. He has no rales.     Chest wall is not dull to percussion. He exhibits  tenderness. He exhibits no mass, no bony tenderness, no laceration, no crepitus, no edema, no deformity, no swelling and no retraction.    Neurological: He is alert and oriented to person, place, and time.   Assessment and Plan :   Fall, initial encounter  Chest wall pain  Rib pain on right side  Acute right-sided thoracic back pain  Muscle spasm  Essential hypertension  Elevated blood pressure reading  Will start conservative management with meloxicam for pain and inflammation, Robaxin for a muscle relaxant. Counseled patient on potential for adverse effects with medications prescribed today, patient verbalized understanding. Patient is to hydrate very well. Radiology report is pending.   Wallis Bamberg, PA-C [facility] at [facility] [phone] 06/04/2018  12:12 PM

## 2018-06-04 NOTE — Patient Instructions (Addendum)
Chest Wall Pain Chest wall pain is pain in or around the bones and muscles of your chest. Sometimes, an injury causes this pain. Sometimes, the cause may not be known. This pain may take several weeks or longer to get better. Follow these instructions at home: Pay attention to any changes in your symptoms. Take these actions to help with your pain:  Rest as told by your health care provider.  Avoid activities that cause pain. These include any activities that use your chest muscles or your abdominal and side muscles to lift heavy items.  If directed, apply ice to the painful area: ? Put ice in a plastic bag. ? Place a towel between your skin and the bag. ? Leave the ice on for 20 minutes, 2-3 times per day.  Take over-the-counter and prescription medicines only as told by your health care provider.  Do not use tobacco products, including cigarettes, chewing tobacco, and e-cigarettes. If you need help quitting, ask your health care provider.  Keep all follow-up visits as told by your health care provider. This is important.  Contact a health care provider if:  You have a fever.  Your chest pain becomes worse.  You have new symptoms. Get help right away if:  You have nausea or vomiting.  You feel sweaty or light-headed.  You have a cough with phlegm (sputum) or you cough up blood.  You develop shortness of breath. This information is not intended to replace advice given to you by your health care provider. Make sure you discuss any questions you have with your health care provider. Document Released: 09/11/2005 Document Revised: 01/20/2016 Document Reviewed: 12/07/2014 Elsevier Interactive Patient Education  2018 [employer].      Muscle Cramps and Spasms Muscle cramps and spasms occur when a muscle or muscles tighten and you have no control over this tightening (involuntary muscle contraction). They are a common problem and can develop in any muscle. The most common  place is in the calf muscles of the leg. Muscle cramps and muscle spasms are both involuntary muscle contractions, but there are some differences between the two:  Muscle cramps are painful. They come and go and may last a few seconds to 15 minutes. Muscle cramps are often more forceful and last longer than muscle spasms.  Muscle spasms may or may not be painful. They may also last just a few seconds or much longer.  Certain medical conditions, such as diabetes or Parkinson disease, can make it more likely to develop cramps or spasms. However, cramps or spasms are usually not caused by a serious underlying problem. Common causes include:  Overexertion.  Overuse from repetitive motions, or doing the same thing over and over.  Remaining in a certain position for a long period of time.  Improper preparation, form, or technique while playing a sport or doing an activity.  Dehydration.  Injury.  Side effects of some medicines.  Abnormally low levels of the salts and ions in your blood (electrolytes), especially potassium and calcium. This could happen if you are taking water pills (diuretics) or if you are pregnant.  In many cases, the cause of muscle cramps or spasms is unknown. Follow these instructions at home:  Stay well hydrated. Drink enough fluid to keep your urine clear or pale yellow.  Try massaging, stretching, and relaxing the affected muscle.  If directed, apply heat to tight or tense muscles as often as told by your health care provider. Use the heat source that your  health care provider recommends, such as a moist heat pack or a heating pad. ? Place a towel between your skin and the heat source. ? Leave the heat on for 20-30 minutes. ? Remove the heat if your skin turns bright red. This is especially important if you are unable to feel pain, heat, or cold. You may have a greater risk of getting burned.  If directed, put ice on the affected area. This may help if you are  sore or have pain after a cramp or spasm. ? Put ice in a plastic bag. ? Place a towel between your skin and the bag. ? Leavethe ice on for 20 minutes, 2-3 times a day.  Take over-the-counter and prescription medicines only as told by your health care provider.  Pay attention to any changes in your symptoms. Contact a health care provider if:  Your cramps or spasms get more severe or happen more often.  Your cramps or spasms do not improve over time. This information is not intended to replace advice given to you by your health care provider. Make sure you discuss any questions you have with your health care provider. Document Released: 03/03/2002 Document Revised: 10/13/2015 Document Reviewed: 06/15/2015 Elsevier Interactive Patient Education  [employer].      If you have lab work done today you will be contacted with your lab results within the next 2 weeks.  If you have not heard from [place] then please contact us. The fastest way to get your results is to register for My Chart.   IF you received an x-ray today, you will receive an invoice from [facility]. Please contact [facility] at [phone] with questions or concerns regarding your invoice.   IF you received labwork today, you will receive an invoice from [facility]. Please contact [facility] at [phone] with questions or concerns regarding your invoice.   Our billing staff will not be able to assist you with questions regarding bills from these companies.  You will be contacted with the lab results as soon as they are available. The fastest way to get your results is to activate your My Chart account. Instructions are located on the last page of this paperwork. If you have not heard from [place] regarding the results in 2 weeks, please contact this office.

## 2018-06-11 ENCOUNTER — Ambulatory Visit (INDEPENDENT_AMBULATORY_CARE_PROVIDER_SITE_OTHER): Payer: Medicare Other | Admitting: Urgent Care

## 2018-06-11 ENCOUNTER — Other Ambulatory Visit: Payer: Self-pay

## 2018-06-11 ENCOUNTER — Encounter: Payer: Self-pay | Admitting: Urgent Care

## 2018-06-11 VITALS — BP 140/62 | HR 94 | Temp 98.3°F | Ht 69.0 in | Wt 147.0 lb

## 2018-06-11 DIAGNOSIS — R202 Paresthesia of skin: Secondary | ICD-10-CM | POA: Diagnosis not present

## 2018-06-11 DIAGNOSIS — R200 Anesthesia of skin: Secondary | ICD-10-CM

## 2018-06-11 DIAGNOSIS — M4316 Spondylolisthesis, lumbar region: Secondary | ICD-10-CM

## 2018-06-11 DIAGNOSIS — M5136 Other intervertebral disc degeneration, lumbar region: Secondary | ICD-10-CM

## 2018-06-11 DIAGNOSIS — Z9889 Other specified postprocedural states: Secondary | ICD-10-CM

## 2018-06-11 DIAGNOSIS — R0789 Other chest pain: Secondary | ICD-10-CM

## 2018-06-11 DIAGNOSIS — M5020 Other cervical disc displacement, unspecified cervical region: Secondary | ICD-10-CM

## 2018-06-11 DIAGNOSIS — S2231XD Fracture of one rib, right side, subsequent encounter for fracture with routine healing: Secondary | ICD-10-CM | POA: Diagnosis not present

## 2018-06-11 NOTE — Patient Instructions (Addendum)
Rib Fracture ° °A rib fracture is a break or crack in one of the bones of the ribs. The ribs are a group of long, curved bones that wrap around your chest and attach to your spine. They protect your lungs and other organs in the chest cavity. A broken or cracked rib is often painful, but most do not cause other problems. Most rib fractures heal on their own over time. However, rib fractures can be more serious if multiple ribs are broken or if broken ribs move out of place and push against other structures. °What are the causes? °· A direct blow to the chest. For example, this could happen during contact sports, a car accident, or a fall against a hard object. °· Repetitive movements with high force, such as pitching a baseball or having severe coughing spells. °What are the signs or symptoms? °· Pain when you breathe in or cough. °· Pain when someone presses on the injured area. °How is this diagnosed? °Your caregiver will perform a physical exam. Various imaging tests may be ordered to confirm the diagnosis and to look for related injuries. These tests may include a chest X-ray, computed tomography (CT), magnetic resonance imaging (MRI), or a bone scan. °How is this treated? °Rib fractures usually heal on their own in 1-3 months. The longer healing period is often associated with a continued cough or other aggravating activities. During the healing period, pain control is very important. Medication is usually given to control pain. Hospitalization or surgery may be needed for more severe injuries, such as those in which multiple ribs are broken or the ribs have moved out of place. °Follow these instructions at home: °· Avoid strenuous activity and any activities or movements that cause pain. Be careful during activities and avoid bumping the injured rib. °· Gradually increase activity as directed by your caregiver. °· Only take over-the-counter or prescription medications as directed by your caregiver. Do not take  other medications without asking your caregiver first. °· Apply ice to the injured area for the first 1-2 days after you have been treated or as directed by your caregiver. Applying ice helps to reduce inflammation and pain. °? Put ice in a plastic bag. °? Place a towel between your skin and the bag. °? Leave the ice on for 15-20 minutes at a time, every 2 hours while you are awake. °· Perform deep breathing as directed by your caregiver. This will help prevent pneumonia, which is a common complication of a broken rib. Your caregiver may instruct you to: °? Take deep breaths several times a day. °? Try to cough several times a day, holding a pillow against the injured area. °? Use a device called an incentive spirometer to practice deep breathing several times a day. °· Drink enough fluids to keep your urine clear or pale yellow. This will help you avoid constipation. °· Do not wear a rib belt or binder. These restrict breathing, which can lead to pneumonia. °Get help right away if: °· You have a fever. °· You have difficulty breathing or shortness of breath. °· You develop a continual cough, or you cough up thick or bloody sputum. °· You feel sick to your stomach (nausea), throw up (vomit), or have abdominal pain. °· You have worsening pain not controlled with medications. °This information is not intended to replace advice given to you by your health care provider. Make sure you discuss any questions you have with your health care provider. °Document Released: 09/11/2005   Revised: 02/17/2016 Document Reviewed: 11/13/2012 Elsevier Interactive Patient Education  [employer]2018 [employer]     If you have lab work done today you will be contacted with your lab results within the next 2 weeks.  If you have not heard from us then please contact us. The fastest way to get your results is to register for My Chart.   IF you received an x-ray today, you will receive an invoice from [facility]. Please  contact [facility] at [phone] with questions or concerns regarding your invoice.   IF you received labwork today, you will receive an invoice from [facility]. Please contact [facility] at [phone] with questions or concerns regarding your invoice.   Our billing staff will not be able to assist you with questions regarding bills from these companies.  You will be contacted with the lab results as soon as they are available. The fastest way to get your results is to activate your My Chart account. Instructions are located on the last page of this paperwork. If you have not heard from us regarding the results in 2 weeks, please contact this office.

## 2018-06-11 NOTE — Progress Notes (Signed)
   MRN: [id number] DOB: 10-06-1955  Subjective:   David Keith is a 62 y.o. male presenting for recheck on his rib pain.  Patient reports that he is doing so much better, has been using methocarbamol and meloxicam with very good results.  States that he does not feel any pain over the right lateral side of his chest anymore.  Reports that he is breathing okay.  He does have intermittent tingling of his hands and feet.  Has a history of surgeries of his back.  Denies weakness, incontinence, difficulty urinating, hematuria.  Patient was recently checked for diabetes and he does not have this either.  reports that he has been smoking cigarettes. He has a 10.00 pack-year smoking history. He has never used smokeless tobacco. He reports that he drinks about 2.0 standard drinks of alcohol per week. He reports that he has current or past drug history. Drug: Marijuana.   David Keith has a current medication list which includes the following prescription(s): amlodipine, docusate sodium, meloxicam, methocarbamol, multivitamin with minerals, and oxycodone. Also is allergic to flexeril [cyclobenzaprine] and other.  David Keith  has a past medical history of Cervical spondylosis with myelopathy and radiculopathy, Chronic back pain, Hypertension, Joint swelling, and Weakness. Also  has a past surgical history that includes Knee arthroscopy (Right, 2011); Colonoscopy; and Anterior cervical decomp/discectomy fusion (N/A, 03/11/2018).  Objective:   Vitals: BP 140/62 (BP Location: Left Arm, Patient Position: Sitting, Cuff Size: Normal)   Pulse 94   Temp 98.3 F (36.8 C) (Oral)   Ht 5\' 9"  (1.753 m)   Wt 147 lb (66.7 kg)   SpO2 96%   BMI 21.71 kg/m   Physical Exam  Constitutional: He is oriented to person, place, and time. He appears well-developed and well-nourished.  Cardiovascular: Normal rate.  Pulmonary/Chest: Effort normal.  Neurological: He is alert and oriented to person, place, and time.   Assessment and  Plan :   Numbness and tingling in both hands  Numbness and tingling of both feet  Cervical herniated disc  Spondylolisthesis of lumbar region  Degenerative disc disease, lumbar  History of back surgery  Closed fracture of one rib of right side with routine healing, subsequent encounter  Chest wall tenderness  Patient is doing much better with respect to his rib fracture and chest wall tenderness.  Continue medications as prescribed.  I offered patient gabapentin to help with the tingling in his hands and feet.  I suspect that this is sequelae from his back surgeries.  I offered patient gabapentin which she declined for now given that his symptoms are very mild in nature.  I did recommend that he set up an office visit for follow-up and recheck with his spine surgeon.  Otherwise follow-up as needed. Counseled patient on potential for adverse effects with medications prescribed today, patient verbalized understanding. Return-to-clinic precautions discussed, patient verbalized understanding.   Wallis BambergMario Kaileah Shevchenko, PA-C [facility] [phone] 06/11/2018  11:11 AM

## 2018-08-01 ENCOUNTER — Ambulatory Visit (INDEPENDENT_AMBULATORY_CARE_PROVIDER_SITE_OTHER): Payer: Medicare Other | Admitting: Family Medicine

## 2018-08-01 ENCOUNTER — Encounter: Payer: Self-pay | Admitting: Family Medicine

## 2018-08-01 ENCOUNTER — Other Ambulatory Visit: Payer: Self-pay

## 2018-08-01 VITALS — BP 150/94 | HR 90 | Temp 98.3°F | Resp 14 | Ht 69.0 in | Wt 146.4 lb

## 2018-08-01 DIAGNOSIS — M25561 Pain in right knee: Secondary | ICD-10-CM

## 2018-08-01 DIAGNOSIS — I10 Essential (primary) hypertension: Secondary | ICD-10-CM | POA: Diagnosis not present

## 2018-08-01 DIAGNOSIS — G8929 Other chronic pain: Secondary | ICD-10-CM

## 2018-08-01 DIAGNOSIS — M1711 Unilateral primary osteoarthritis, right knee: Secondary | ICD-10-CM

## 2018-08-01 MED ORDER — METHOCARBAMOL 500 MG PO TABS: 500.0000 mg | ORAL_TABLET | Freq: Three times a day (TID) | ORAL | 3 refills | Status: DC | PRN

## 2018-08-01 MED ORDER — AMLODIPINE BESYLATE 10 MG PO TABS: 10.0000 mg | ORAL_TABLET | Freq: Every day | ORAL | 1 refills | Status: DC

## 2018-08-01 MED ORDER — MELOXICAM 7.5 MG PO TABS: 7.5000 mg | ORAL_TABLET | Freq: Every day | ORAL | 1 refills | Status: DC

## 2018-08-01 NOTE — Progress Notes (Signed)
11/7/201911:16 AM  David Keith 1955-09-26, 62 y.o. male [id number]  Chief Complaint  Patient presents with  . Follow-up    right knee pain -Medication managment need refiil on amlodipine and robaxin    HPI:   Patient is a 62 y.o. male with past medical history significant for HTN, DDD with radiculopathy, right knee pain who presents today for routine followup  Had cervical spine sugery in June 2019 LUE doing a bit better, no weakness, still tingling Sees Dr Lovell Sheehan  Right knee tingles sometimes when he sleeps of walks Was taking meloxicam prn Given methocarbamol - helps a lot Has known advanced DJD Does not want any surgeries at this time, on a fix income  He walks with a cane  He has not taken BP medication this morning  Declines flu vaccine today  Prior colonoscopy no polyps, denies any fhx colon cancer  Lab Results  Component Value Date   CREATININE 0.98 03/05/2018   Lab Results  Component Value Date   LDLCALC 79 02/05/2018    Fall Risk  08/01/2018 08/01/2018 08/01/2018 06/11/2018 06/04/2018  Falls in the past year? 0 0 0 Yes Yes  Number falls in past yr: - - - - 1  Injury with Fall? - - - - Yes  Follow up - - - - -     Depression screen [facility] 2/9 08/01/2018 08/01/2018 06/11/2018  Decreased Interest 0 0 0  Down, Depressed, Hopeless 0 0 0  PHQ - 2 Score 0 0 0    Allergies  Allergen Reactions  . Flexeril [Cyclobenzaprine] Other (See Comments)    Gives him weird thoughts  . Other     NO BLOOD PRODUCTS. (pt will accept Albumin)    Prior to Admission medications   Medication Sig Start Date End Date Taking? Authorizing Provider  amLODipine (NORVASC) 10 MG tablet TAKE 1 TABLET BY MOUTH EVERY DAY. Patient taking differently: Take 10 mg by mouth daily.  12/25/17  Yes Massie Maroon, FNP  docusate sodium (COLACE) 100 MG capsule Take 1 capsule (100 mg total) by mouth 2 (two) times daily. 03/12/18  Yes Tressie Stalker, MD  meloxicam (MOBIC) 7.5 MG tablet  Take 1 tablet (7.5 mg total) by mouth daily. 06/04/18  Yes Wallis Bamberg, PA-C  methocarbamol (ROBAXIN) 500 MG tablet Take 1 tablet (500 mg total) by mouth every 8 (eight) hours as needed for muscle spasms. 06/04/18  Yes Wallis Bamberg, PA-C  Multiple Vitamin (MULTIVITAMIN WITH MINERALS) TABS tablet Take 1 tablet by mouth daily.   Yes [provider]  oxyCODONE (OXY IR/ROXICODONE) 5 MG immediate release tablet Take 1 tablet (5 mg total) by mouth every 4 (four) hours as needed for moderate pain ((score 4 to 6)). 03/12/18  Yes Tressie Stalker, MD    Past Medical History:  Diagnosis Date  . Cervical spondylosis with myelopathy and radiculopathy   . Chronic back pain    radiculopathy and stenosis  . Hypertension    takes AMlodipine daily  . Joint swelling    right knee  . Weakness    numbness in both legs when walking    Past Surgical History:  Procedure Laterality Date  . ANTERIOR CERVICAL DECOMP/DISCECTOMY FUSION N/A 03/11/2018   Procedure: ANTERIOR CERVICAL DECOMPRESSION/DISCECTOMY FUSION, INTERBODY PROSTHESIS, PLATE/SCREWS CERVICAL THREE- CERVICAL FOUR;  Surgeon: Tressie Stalker, MD;  Location: [facility] OR;  Service: Neurosurgery;  Laterality: N/A;  ANTERIOR CERVICAL DECOMPRESSION/DISCECTOMY FUSION, INTERBODY PROSTHESIS, PLATE/SCREWS CERVICAL THREE- CERVICAL FOUR  . COLONOSCOPY    .  KNEE ARTHROSCOPY Right 2011    Social History   Tobacco Use  . Smoking status: Current Every Day Smoker    Packs/day: 0.50    Years: 20.00    Pack years: 10.00    Types: Cigarettes  . Smokeless tobacco: Never Used  . Tobacco comment: 3 packs a week  Substance Use Topics  . Alcohol use: Yes    Alcohol/week: 2.0 standard drinks    Types: 2 Cans of beer per week    Comment: daily    Family History  Problem Relation Age of Onset  . Cancer Mother   . Heart disease Father   . Diabetes Brother     Review of Systems  Constitutional: Negative for chills and fever.  Respiratory: Negative for cough  and shortness of breath.   Cardiovascular: Negative for chest pain, palpitations and leg swelling.  Gastrointestinal: Negative for abdominal pain, nausea and vomiting.     OBJECTIVE:  Blood pressure (!) 150/94, pulse 90, temperature 98.3 F (36.8 C), temperature source Oral, resp. rate 14, height 5\' 9"  (1.753 m), weight 146 lb 6.4 oz (66.4 kg), SpO2 98 %. Body mass index is 21.62 kg/m.   BP Readings from Last 3 Encounters:  08/01/18 (!) 150/94  06/11/18 140/62  06/04/18 (!) 146/88    Physical Exam  Constitutional: He is oriented to person, place, and time. He appears well-developed and well-nourished.  HENT:  Head: Normocephalic and atraumatic.  Mouth/Throat: Oropharynx is clear and moist.  Eyes: Pupils are equal, round, and reactive to light. Conjunctivae and EOM are normal.  Neck: Neck supple.  Cardiovascular: Normal rate and regular rhythm. Exam reveals no gallop and no friction rub.  No murmur heard. Pulmonary/Chest: Effort normal and breath sounds normal. He has no wheezes. He has no rales.  Musculoskeletal: He exhibits no edema.  Neurological: He is alert and oriented to person, place, and time.  Skin: Skin is warm and dry.  Psychiatric: He has a normal mood and affect.  Nursing note and vitals reviewed.   ASSESSMENT and PLAN  1. Essential hypertension Today above goal, has not taken meds today. Previous controlled. cont current regime. Reassess at next visit - amLODipine (NORVASC) 10 MG tablet; Take 1 tablet (10 mg total) by mouth daily. - Basic Metabolic Panel  2. Chronic pain of right knee 3. Primary osteoarthritis of right knee Stable. Refill meds. Discussed r/se/b  Other orders - meloxicam (MOBIC) 7.5 MG tablet; Take 1 tablet (7.5 mg total) by mouth daily. - methocarbamol (ROBAXIN) 500 MG tablet; Take 1 tablet (500 mg total) by mouth every 8 (eight) hours as needed for muscle spasms.   Return in about 6 months (around 01/30/2019).    Myles Lipps,  MD Primary Care at [facility] [address] [place], [place] [address] Ph.  [phone] Fax [phone]

## 2018-08-01 NOTE — Patient Instructions (Signed)
° ° ° °  If you have lab work done today you will be contacted with your lab results within the next 2 weeks.  If you have not heard from us then please contact us. The fastest way to get your results is to register for My Chart. ° ° °IF you received an x-ray today, you will receive an invoice from [facility]. Please contact [facility] at [phone] with questions or concerns regarding your invoice.  ° °IF you received labwork today, you will receive an invoice from [facility]. Please contact [facility] at [phone] with questions or concerns regarding your invoice.  ° °Our billing staff will not be able to assist you with questions regarding bills from these companies. ° °You will be contacted with the lab results as soon as they are available. The fastest way to get your results is to activate your My Chart account. Instructions are located on the last page of this paperwork. If you have not heard from us regarding the results in 2 weeks, please contact this office. °  ° ° ° °

## 2018-08-02 LAB — BASIC METABOLIC PANEL
BUN/Creatinine Ratio: 14 (ref 10–24)
BUN: 11 mg/dL (ref 8–27)
CO2: 23 mmol/L (ref 20–29)
Calcium: 9.3 mg/dL (ref 8.6–10.2)
Chloride: 104 mmol/L (ref 96–106)
Creatinine, Ser: 0.79 mg/dL (ref 0.76–1.27)
GFR calc Af Amer: 110 mL/min/{1.73_m2} (ref >59)
GFR calc non Af Amer: 95 mL/min/{1.73_m2} (ref >59)
Glucose: 106 mg/dL — ABNORMAL HIGH (ref 65–99)
Potassium: 4.3 mmol/L (ref 3.5–5.2)
Sodium: 143 mmol/L (ref 134–144)

## 2018-08-26 ENCOUNTER — Ambulatory Visit: Payer: No Typology Code available for payment source | Admitting: Family Medicine

## 2018-09-02 ENCOUNTER — Ambulatory Visit (INDEPENDENT_AMBULATORY_CARE_PROVIDER_SITE_OTHER): Payer: Medicare Other | Admitting: Family Medicine

## 2018-09-02 ENCOUNTER — Encounter: Payer: Self-pay | Admitting: Family Medicine

## 2018-09-02 VITALS — BP 140/89 | HR 106 | Temp 99.0°F | Resp 16 | Ht 69.0 in | Wt 147.0 lb

## 2018-09-02 DIAGNOSIS — Z1211 Encounter for screening for malignant neoplasm of colon: Secondary | ICD-10-CM

## 2018-09-02 DIAGNOSIS — I10 Essential (primary) hypertension: Secondary | ICD-10-CM | POA: Diagnosis not present

## 2018-09-02 LAB — POCT URINALYSIS DIPSTICK
Bilirubin, UA: NEGATIVE
Blood, UA: NEGATIVE
Glucose, UA: NEGATIVE
Ketones, UA: NEGATIVE
Leukocytes, UA: NEGATIVE
Nitrite, UA: NEGATIVE
Protein, UA: NEGATIVE
Spec Grav, UA: 1.025 (ref 1.010–1.025)
Urobilinogen, UA: 0.2 E.U./dL
pH, UA: 5.5 (ref 5.0–8.0)

## 2018-09-02 NOTE — Patient Instructions (Signed)

## 2018-09-02 NOTE — Progress Notes (Signed)
Established Patient Office Visit  Subjective:  Patient ID: David Keith, male    DOB: 1956/08/23  Age: 62 y.o. MRN: [id number]  CC:  Chief Complaint  Patient presents with  . Hypertension    HPI David Keith presents for follow up on hypertension. States that he has a history of chronic knee pain. Patient states he had CERVICAL SPONDYLOSIS WITH MYELOPATHY AND RADICULOPATHY and had anterior cervical decompression/discectomy fusion interbody prosthesis, plate/screws on 03/11/2018.  Patient states that he does exercise by walking every day. Patient states that he smokes 1 pack every 1.5 days. Not interested in quitting at the present time. He reports drinking alcohol daily.  Past Medical History:  Diagnosis Date  . Cervical spondylosis with myelopathy and radiculopathy   . Chronic back pain    radiculopathy and stenosis  . Hypertension    takes AMlodipine daily  . Joint swelling    right knee  . Weakness    numbness in both legs when walking    Past Surgical History:  Procedure Laterality Date  . ANTERIOR CERVICAL DECOMP/DISCECTOMY FUSION N/A 03/11/2018   Procedure: ANTERIOR CERVICAL DECOMPRESSION/DISCECTOMY FUSION, INTERBODY PROSTHESIS, PLATE/SCREWS CERVICAL THREE- CERVICAL FOUR;  Surgeon: Tressie Stalker, MD;  Location: [facility] OR;  Service: Neurosurgery;  Laterality: N/A;  ANTERIOR CERVICAL DECOMPRESSION/DISCECTOMY FUSION, INTERBODY PROSTHESIS, PLATE/SCREWS CERVICAL THREE- CERVICAL FOUR  . COLONOSCOPY    . KNEE ARTHROSCOPY Right 2011    Family History  Problem Relation Age of Onset  . Cancer Mother   . Heart disease Father   . Diabetes Brother     Social History   Socioeconomic History  . Marital status: Married    Spouse name: Not on file  . Number of children: Not on file  . Years of education: Not on file  . Highest education level: Not on file  Occupational History  . Not on file  Social Needs  . Financial resource strain: Not on file  . Food  insecurity:    Worry: Not on file    Inability: Not on file  . Transportation needs:    Medical: Not on file    Non-medical: Not on file  Tobacco Use  . Smoking status: Current Every Day Smoker    Packs/day: 0.50    Years: 20.00    Pack years: 10.00    Types: Cigarettes  . Smokeless tobacco: Never Used  . Tobacco comment: 3 packs a week  Substance and Sexual Activity  . Alcohol use: Yes    Alcohol/week: 2.0 standard drinks    Types: 2 Cans of beer per week    Comment: daily  . Drug use: Yes    Types: Marijuana  . Sexual activity: Not on file  Lifestyle  . Physical activity:    Days per week: Not on file    Minutes per session: Not on file  . Stress: Not on file  Relationships  . Social connections:    Talks on phone: Not on file    Gets together: Not on file    Attends religious service: Not on file    Active member of club or organization: Not on file    Attends meetings of clubs or organizations: Not on file    Relationship status: Not on file  . Intimate partner violence:    Fear of current or ex partner: Not on file    Emotionally abused: Not on file    Physically abused: Not on file    Forced sexual activity: Not  on file  Other Topics Concern  . Not on file  Social History Narrative  . Not on file    Outpatient Medications Prior to Visit  Medication Sig Dispense Refill  . amLODipine (NORVASC) 10 MG tablet Take 1 tablet (10 mg total) by mouth daily. 90 tablet 1  . meloxicam (MOBIC) 7.5 MG tablet Take 1 tablet (7.5 mg total) by mouth daily. 30 tablet 1  . methocarbamol (ROBAXIN) 500 MG tablet Take 1 tablet (500 mg total) by mouth every 8 (eight) hours as needed for muscle spasms. 60 tablet 3  . Multiple Vitamin (MULTIVITAMIN WITH MINERALS) TABS tablet Take 1 tablet by mouth daily.    Marland Kitchen docusate sodium (COLACE) 100 MG capsule Take 1 capsule (100 mg total) by mouth 2 (two) times daily. (Patient not taking: Reported on 09/02/2018) 60 capsule 0  . oxyCODONE (OXY  IR/ROXICODONE) 5 MG immediate release tablet Take 1 tablet (5 mg total) by mouth every 4 (four) hours as needed for moderate pain ((score 4 to 6)). (Patient not taking: Reported on 09/02/2018) 30 tablet 0   No facility-administered medications prior to visit.     Allergies  Allergen Reactions  . Flexeril [Cyclobenzaprine] Other (See Comments)    Gives him weird thoughts  . Other     NO BLOOD PRODUCTS. (pt will accept Albumin)    ROS Review of Systems  Constitutional: Negative.   HENT: Negative.   Eyes: Negative.   Respiratory: Negative.   Cardiovascular: Negative.   Gastrointestinal: Negative.   Endocrine: Negative.   Genitourinary: Negative.   Musculoskeletal: Negative.   Skin: Negative.   Allergic/Immunologic: Negative.   Neurological: Negative.   Hematological: Negative.   Psychiatric/Behavioral: Negative.       Objective:    Physical Exam  Constitutional: He is oriented to person, place, and time. He appears well-developed and well-nourished. No distress.  HENT:  Head: Normocephalic and atraumatic.  Eyes: Pupils are equal, round, and reactive to light. Conjunctivae and EOM are normal.  Neck: Normal range of motion.  Cardiovascular: Normal rate, regular rhythm and normal heart sounds.  Pulmonary/Chest: Effort normal and breath sounds normal. No respiratory distress.  Musculoskeletal: Normal range of motion.  Neurological: He is alert and oriented to person, place, and time.  Skin: Skin is warm and dry.  Psychiatric: He has a normal mood and affect. His behavior is normal. Judgment and thought content normal.  Nursing note and vitals reviewed.   BP 140/89 (BP Location: Right Arm, Patient Position: Sitting, Cuff Size: Normal)   Pulse (!) 106   Temp 99 F (37.2 C) (Oral)   Resp 16   Ht 5\' 9"  (1.753 m)   Wt 147 lb (66.7 kg)   SpO2 100%   BMI 21.71 kg/m  Wt Readings from Last 3 Encounters:  09/02/18 147 lb (66.7 kg)  08/01/18 146 lb 6.4 oz (66.4 kg)   06/11/18 147 lb (66.7 kg)     Health Maintenance Due  Topic Date Due  . COLONOSCOPY  01/17/2017    There are no preventive care reminders to display for this patient.  Lab Results  Component Value Date   TSH 1.714 02/05/2018   Lab Results  Component Value Date   WBC 5.2 03/05/2018   HGB 14.7 03/05/2018   HCT 43.9 03/05/2018   MCV 88.0 03/05/2018   PLT 185 03/05/2018   Lab Results  Component Value Date   NA 143 08/01/2018   K 4.3 08/01/2018   CO2 23 08/01/2018  GLUCOSE 106 (H) 08/01/2018   BUN 11 08/01/2018   CREATININE 0.79 08/01/2018   BILITOT 0.5 03/05/2018   ALKPHOS 70 03/05/2018   AST 27 03/05/2018   ALT 21 03/05/2018   PROT 7.0 03/05/2018   ALBUMIN 4.1 03/05/2018   CALCIUM 9.3 08/01/2018   ANIONGAP 7 03/05/2018   Lab Results  Component Value Date   CHOL 163 02/05/2018   Lab Results  Component Value Date   HDL 68 02/05/2018   Lab Results  Component Value Date   LDLCALC 79 02/05/2018   Lab Results  Component Value Date   TRIG 81 02/05/2018   Lab Results  Component Value Date   CHOLHDL 2.4 02/05/2018   Lab Results  Component Value Date   HGBA1C 5.1 02/05/2018      Assessment & Plan:   Problem List Items Addressed This Visit      Cardiovascular and Mediastinum   Hypertension - Primary   Relevant Orders   Urinalysis Dipstick (Completed)    Other Visit Diagnoses    Screen for colon cancer       Relevant Orders   Cologuard      No orders of the defined types were placed in this encounter.   Follow-up: Return in about 6 months (around 03/04/2019).  Patient is seeing PCP at [facility]. Informed patient that he will need to choose a PCP as he cannot be seen by both practices. Patient states that he will continue with this clinic.   Mike GipAndre Adalberto Metzgar, FNP

## 2018-11-13 ENCOUNTER — Telehealth: Payer: Self-pay

## 2018-11-13 DIAGNOSIS — I10 Essential (primary) hypertension: Secondary | ICD-10-CM

## 2018-11-13 MED ORDER — MELOXICAM 7.5 MG PO TABS: 7.5000 mg | ORAL_TABLET | Freq: Every day | ORAL | 1 refills | Status: DC

## 2018-11-13 MED ORDER — AMLODIPINE BESYLATE 10 MG PO TABS: 10.0000 mg | ORAL_TABLET | Freq: Every day | ORAL | 1 refills | Status: DC

## 2018-11-13 MED ORDER — METHOCARBAMOL 500 MG PO TABS: 500.0000 mg | ORAL_TABLET | Freq: Three times a day (TID) | ORAL | 3 refills | Status: DC | PRN

## 2018-11-13 NOTE — Telephone Encounter (Signed)
Refills for medications sent into pharmacy. Asked that patient keep next appointment. Thanks!

## 2018-11-15 ENCOUNTER — Other Ambulatory Visit: Payer: Self-pay

## 2018-11-15 DIAGNOSIS — I10 Essential (primary) hypertension: Secondary | ICD-10-CM

## 2018-11-15 MED ORDER — METHOCARBAMOL 500 MG PO TABS: 500.0000 mg | ORAL_TABLET | Freq: Three times a day (TID) | ORAL | 3 refills | Status: DC | PRN

## 2018-11-15 MED ORDER — MELOXICAM 7.5 MG PO TABS: 7.5000 mg | ORAL_TABLET | Freq: Every day | ORAL | 1 refills | Status: DC

## 2018-11-15 MED ORDER — AMLODIPINE BESYLATE 10 MG PO TABS: 10.0000 mg | ORAL_TABLET | Freq: Every day | ORAL | 1 refills | Status: DC

## 2018-11-15 MED FILL — MELOXICAM 7.5 MG TABLET: 7.5 | 30 days supply | Qty: 30 | Fill #0

## 2018-11-15 MED FILL — AMLODIPINE BESYLATE 10 MG T: 10 | 30 days supply | Qty: 30 | Fill #0

## 2018-11-15 MED FILL — METHOCARBAMOL 500 MG TABS: 500 | 20 days supply | Qty: 60 | Fill #0

## 2019-03-05 ENCOUNTER — Ambulatory Visit: Payer: No Typology Code available for payment source | Admitting: Family Medicine

## 2019-03-11 ENCOUNTER — Telehealth: Payer: Self-pay

## 2019-03-11 NOTE — Telephone Encounter (Signed)
Called and spoke with patient for COVID 19 Screening. Patient had no risk factors and is cleared to come into office for appointment. Thanks! 

## 2019-03-12 ENCOUNTER — Other Ambulatory Visit: Payer: Self-pay

## 2019-03-12 ENCOUNTER — Encounter: Payer: Self-pay | Admitting: Family Medicine

## 2019-03-12 ENCOUNTER — Ambulatory Visit (INDEPENDENT_AMBULATORY_CARE_PROVIDER_SITE_OTHER): Payer: Medicare Other | Admitting: Family Medicine

## 2019-03-12 VITALS — BP 156/56 | HR 80 | Temp 98.9°F | Resp 18 | Ht 69.0 in | Wt 149.0 lb

## 2019-03-12 DIAGNOSIS — Z720 Tobacco use: Secondary | ICD-10-CM

## 2019-03-12 DIAGNOSIS — G8929 Other chronic pain: Secondary | ICD-10-CM | POA: Diagnosis not present

## 2019-03-12 DIAGNOSIS — M25561 Pain in right knee: Secondary | ICD-10-CM | POA: Diagnosis not present

## 2019-03-12 DIAGNOSIS — I10 Essential (primary) hypertension: Secondary | ICD-10-CM

## 2019-03-12 LAB — POCT URINALYSIS DIPSTICK
Bilirubin, UA: NEGATIVE
Blood, UA: NEGATIVE
Glucose, UA: NEGATIVE
Ketones, UA: NEGATIVE
Leukocytes, UA: NEGATIVE
Nitrite, UA: NEGATIVE
Protein, UA: NEGATIVE
Spec Grav, UA: 1.02 (ref 1.010–1.025)
Urobilinogen, UA: 0.2 E.U./dL
pH, UA: 6 (ref 5.0–8.0)

## 2019-03-12 MED ORDER — AMLODIPINE BESYLATE 10 MG PO TABS: 10.0000 mg | ORAL_TABLET | Freq: Every day | ORAL | 1 refills | Status: DC

## 2019-03-12 MED ORDER — MELOXICAM 7.5 MG PO TABS: 7.5000 mg | ORAL_TABLET | Freq: Every day | ORAL | 1 refills | Status: DC

## 2019-03-12 MED ORDER — METHOCARBAMOL 500 MG PO TABS: 500.0000 mg | ORAL_TABLET | Freq: Three times a day (TID) | ORAL | 0 refills | Status: DC | PRN

## 2019-03-12 MED ORDER — DICLOFENAC SODIUM 1 % TD GEL: 2.0000 g | Freq: Four times a day (QID) | TRANSDERMAL | 2 refills | Status: AC

## 2019-03-12 MED ORDER — DICLOFENAC SODIUM 1 % TD GEL: 2.0000 g | Freq: Four times a day (QID) | TRANSDERMAL | 2 refills | Status: DC

## 2019-03-12 MED FILL — MELOXICAM 7.5 MG TABLET: 7.5 | 30 days supply | Qty: 30 | Fill #0

## 2019-03-12 MED FILL — DICLOFENAC SODIUM 1% GEL: 1 | 12 days supply | Qty: 100 | Fill #0

## 2019-03-12 MED FILL — AMLODIPINE BESYLATE 10 MG T: 10 | 30 days supply | Qty: 30 | Fill #0

## 2019-03-12 MED FILL — METHOCARBAMOL 500 MG TABS: 500 | 20 days supply | Qty: 60 | Fill #0

## 2019-03-12 NOTE — Progress Notes (Signed)
Patient [facility]   Progress Note: General Provider: Lanae Boast, FNP  SUBJECTIVE:   David Keith is a 63 y.o. male who  has a past medical history of Cervical spondylosis with myelopathy and radiculopathy, Chronic back pain, Hypertension, Joint swelling, and Weakness.. Patient presents today for Hypertension and Knee Pain (right knee. voltern refills. ) Patient reports not taking his medications this morning. He states that he is compliant with his medication and does not report side effects at the present time. He states that he has a history of right knee pain and has had surgery in the past to remove damaged cartilage. He states that the knee remains swollen and painful. He states that he wears a brace to the right knee and ambulates with a cane. He states that his knee is warm especially at the end of the day.   Review of Systems  Constitutional: Negative.   HENT: Negative.   Eyes: Negative.   Respiratory: Negative.   Cardiovascular: Negative.   Gastrointestinal: Negative.   Genitourinary: Negative.   Musculoskeletal: Positive for joint pain.  Skin: Negative.   Neurological: Negative.   Psychiatric/Behavioral: Negative.      OBJECTIVE: BP (!) 156/56 (BP Location: Right Arm, Patient Position: Sitting, Cuff Size: Normal)   Pulse 80   Temp 98.9 F (37.2 C) (Oral)   Resp 18   Ht 5\' 9"  (1.753 m)   Wt 149 lb (67.6 kg)   SpO2 100%   BMI 22.00 kg/m   Wt Readings from Last 3 Encounters:  03/12/19 149 lb (67.6 kg)  09/02/18 147 lb (66.7 kg)  08/01/18 146 lb 6.4 oz (66.4 kg)     Physical Exam Vitals signs and nursing note reviewed.  Constitutional:      General: He is not in acute distress.    Appearance: Normal appearance.  HENT:     Head: Normocephalic and atraumatic.  Eyes:     Extraocular Movements: Extraocular movements intact.     Conjunctiva/sclera: Conjunctivae normal.     Pupils: Pupils are equal, round, and  reactive to light.  Cardiovascular:     Rate and Rhythm: Normal rate and regular rhythm.     Heart sounds: No murmur.  Pulmonary:     Effort: Pulmonary effort is normal.     Breath sounds: Normal breath sounds.  Musculoskeletal:     Right knee: He exhibits decreased range of motion and swelling. Tenderness found. Medial joint line tenderness noted.  Skin:    General: Skin is warm and dry.  Neurological:     Mental Status: He is alert and oriented to person, place, and time.  Psychiatric:        Mood and Affect: Mood normal.        Behavior: Behavior normal.        Thought Content: Thought content normal.        Judgment: Judgment normal.     ASSESSMENT/PLAN:   1. Essential hypertension Continue with current medications. Patient to return for fasting labs.  - Urinalysis Dipstick - Lipid Panel With LDL/HDL Ratio; Future - Comprehensive metabolic panel; Future - amLODipine (NORVASC) 10 MG tablet; Take 1 tablet (10 mg total) by mouth daily.  Dispense: 90 tablet; Refill: 1  2. Chronic pain of right knee Referred for further evaluation.  - Ambulatory referral to Sports Medicine - methocarbamol (ROBAXIN) 500 MG tablet; Take 1 tablet (500 mg total) by mouth every 8 (eight) hours as needed for muscle  spasms.  Dispense: 60 tablet; Refill: 0 - meloxicam (MOBIC) 7.5 MG tablet; Take 1 tablet (7.5 mg total) by mouth daily.  Dispense: 30 tablet; Refill: 1 - diclofenac sodium (VOLTAREN) 1 % GEL; Apply 2 g topically 4 (four) times daily.  Dispense: 150 g; Refill: 2  3. Tobacco abuse Smoking cessation instruction/counseling given:  counseled patient on the dangers of tobacco use, advised patient to stop smoking, and reviewed strategies to maximize success    Return in about 3 months (around 06/12/2019) for htnfasting labs in one week. .    The patient was given clear instructions to go to ER or return to medical center if symptoms do not improve, worsen or new problems develop. The patient  verbalized understanding and agreed with plan of care.   Ms. Freda Jacksonndr L. Riley Lamouglas, FNP-BC [facility] [address]  [place], [place] [id number] [phone]

## 2019-03-12 NOTE — Patient Instructions (Signed)
Acute Knee Pain, Adult Many things can cause knee pain. Sometimes, knee pain is sudden (acute) and may be caused by damage, swelling, or irritation of the muscles and tissues that support your knee. The pain often goes away on its own with time and rest. If the pain does not go away, tests may be done to find out what is causing the pain. Follow these instructions at home: Pay attention to any changes in your symptoms. Take these actions to relieve your pain. If you have a knee sleeve or brace:   Wear the sleeve or brace as told by your doctor. Remove it only as told by your doctor.  Loosen the sleeve or brace if your toes: ? Tingle. ? Become numb. ? Turn cold and blue.  Keep the sleeve or brace clean.  If the sleeve or brace is not waterproof: ? Do not let it get wet. ? Cover it with a watertight covering when you take a bath or shower. Activity  Rest your knee.  Do not do things that cause pain.  Avoid activities where both feet leave the ground at the same time (high-impact activities). Examples are running, jumping rope, and doing jumping jacks.  Work with a physical therapist to make a safe exercise program, as told by your doctor. Managing pain, stiffness, and swelling   If told, put ice on the knee: ? Put ice in a plastic bag. ? Place a towel between your skin and the bag. ? Leave the ice on for 20 minutes, 2-3 times a day.  If told, put pressure (compression) on your injured knee to control swelling, give support, and help with discomfort. Compression may be done with an elastic bandage. General instructions  Take all medicines only as told by your doctor.  Raise (elevate) your knee while you are sitting or lying down. Make sure your knee is higher than your heart.  Sleep with a pillow under your knee.  Do not use any products that contain nicotine or tobacco. These include cigarettes, e-cigarettes, and chewing tobacco. These products may slow down healing. If  you need help quitting, ask your doctor.  If you are overweight, work with your doctor and a food expert (dietitian) to set goals to lose weight. Being overweight can make your knee hurt more.  Keep all follow-up visits as told by your doctor. This is important. Contact a doctor if:  The knee pain does not stop.  The knee pain changes or gets worse.  You have a fever along with knee pain.  Your knee feels warm when you touch it.  Your knee gives out or locks up. Get help right away if:  Your knee swells, and the swelling gets worse.  You cannot move your knee.  You have very bad knee pain. Summary  Many things can cause knee pain. The pain often goes away on its own with time and rest.  Your doctor may do tests to find out the cause of the pain.  Pay attention to any changes in your symptoms. Relieve your pain with rest, medicines, light activity, and use of ice.  Get help right away if you cannot move your knee or your knee pain is very bad. This information is not intended to replace advice given to you by your health care provider. Make sure you discuss any questions you have with your health care provider. Document Released: 12/08/2008 Document Revised: 02/21/2018 Document Reviewed: 02/21/2018 Elsevier Interactive Patient Education  2019 [employer] Hypertension  Hypertension is another name for high blood pressure. High blood pressure forces your heart to work harder to pump blood. This can cause problems over time. There are two numbers in a blood pressure reading. There is a top number (systolic) over a bottom number (diastolic). It is best to have a blood pressure below 120/80. Healthy choices can help lower your blood pressure. You may need medicine to help lower your blood pressure if:  Your blood pressure cannot be lowered with healthy choices.  Your blood pressure is higher than 130/80. Follow these instructions at home: Eating and drinking   If directed,  follow the DASH eating plan. This diet includes: ? Filling half of your plate at each meal with fruits and vegetables. ? Filling one quarter of your plate at each meal with whole grains. Whole grains include whole wheat pasta, brown rice, and whole grain bread. ? Eating or drinking low-fat dairy products, such as skim milk or low-fat yogurt. ? Filling one quarter of your plate at each meal with low-fat (lean) proteins. Low-fat proteins include fish, skinless chicken, eggs, beans, and tofu. ? Avoiding fatty meat, cured and processed meat, or chicken with skin. ? Avoiding premade or processed food.  Eat less than 1,500 mg of salt (sodium) a day.  Limit alcohol use to no more than 1 drink a day for nonpregnant women and 2 drinks a day for men. One drink equals 12 oz of beer, 5 oz of wine, or 1 oz of hard liquor. Lifestyle  Work with your doctor to stay at a healthy weight or to lose weight. Ask your doctor what the best weight is for you.  Get at least 30 minutes of exercise that causes your heart to beat faster (aerobic exercise) most days of the week. This may include walking, swimming, or biking.  Get at least 30 minutes of exercise that strengthens your muscles (resistance exercise) at least 3 days a week. This may include lifting weights or pilates.  Do not use any products that contain nicotine or tobacco. This includes cigarettes and e-cigarettes. If you need help quitting, ask your doctor.  Check your blood pressure at home as told by your doctor.  Keep all follow-up visits as told by your doctor. This is important. Medicines  Take over-the-counter and prescription medicines only as told by your doctor. Follow directions carefully.  Do not skip doses of blood pressure medicine. The medicine does not work as well if you skip doses. Skipping doses also puts you at risk for problems.  Ask your doctor about side effects or reactions to medicines that you should watch for. Contact a  doctor if:  You think you are having a reaction to the medicine you are taking.  You have headaches that keep coming back (recurring).  You feel dizzy.  You have swelling in your ankles.  You have trouble with your vision. Get help right away if:  You get a very bad headache.  You start to feel confused.  You feel weak or numb.  You feel faint.  You get very bad pain in your: ? Chest. ? Belly (abdomen).  You throw up (vomit) more than once.  You have trouble breathing. Summary  Hypertension is another name for high blood pressure.  Making healthy choices can help lower blood pressure. If your blood pressure cannot be controlled with healthy choices, you may need to take medicine. This information is not intended to replace advice given to you by your health care  provider. Make sure you discuss any questions you have with your health care provider. Document Released: 02/28/2008 Document Revised: 08/09/2016 Document Reviewed: 08/09/2016 Elsevier Interactive Patient Education  2019 Elsevier Inc.  

## 2019-03-18 ENCOUNTER — Other Ambulatory Visit: Payer: Self-pay

## 2019-03-18 ENCOUNTER — Other Ambulatory Visit: Payer: Medicare Other

## 2019-03-18 ENCOUNTER — Ambulatory Visit: Payer: No Typology Code available for payment source | Admitting: Family Medicine

## 2019-03-18 DIAGNOSIS — I10 Essential (primary) hypertension: Secondary | ICD-10-CM | POA: Diagnosis not present

## 2019-03-19 ENCOUNTER — Other Ambulatory Visit: Payer: No Typology Code available for payment source

## 2019-03-19 ENCOUNTER — Telehealth: Payer: Self-pay

## 2019-03-19 LAB — COMPREHENSIVE METABOLIC PANEL
ALT: 36 IU/L (ref 0–44)
AST: 54 IU/L — ABNORMAL HIGH (ref 0–40)
Albumin/Globulin Ratio: 1.6 (ref 1.2–2.2)
Albumin: 4.2 g/dL (ref 3.8–4.8)
Alkaline Phosphatase: 76 IU/L (ref 39–117)
BUN/Creatinine Ratio: 10 (ref 10–24)
BUN: 8 mg/dL (ref 8–27)
Bilirubin Total: 0.4 mg/dL (ref 0.0–1.2)
CO2: 22 mmol/L (ref 20–29)
Calcium: 9 mg/dL (ref 8.6–10.2)
Chloride: 106 mmol/L (ref 96–106)
Creatinine, Ser: 0.81 mg/dL (ref 0.76–1.27)
GFR calc Af Amer: 109 mL/min/{1.73_m2} (ref >59)
GFR calc non Af Amer: 94 mL/min/{1.73_m2} (ref >59)
Globulin, Total: 2.6 g/dL (ref 1.5–4.5)
Glucose: 96 mg/dL (ref 65–99)
Potassium: 4.1 mmol/L (ref 3.5–5.2)
Sodium: 142 mmol/L (ref 134–144)
Total Protein: 6.8 g/dL (ref 6.0–8.5)

## 2019-03-19 LAB — LIPID PANEL WITH LDL/HDL RATIO
Cholesterol, Total: 157 mg/dL (ref 100–199)
HDL: 78 mg/dL (ref >39)
LDL Calculated: 67 mg/dL (ref 0–99)
LDl/HDL Ratio: 0.9 ratio (ref 0.0–3.6)
Triglycerides: 58 mg/dL (ref 0–149)
VLDL Cholesterol Cal: 12 mg/dL (ref 5–40)

## 2019-03-19 NOTE — Progress Notes (Signed)
The  labs are stable without significant clinical change.  All other results are normal or within acceptable limits. No Medication changes at the present time. Please return for your follow up appts.  

## 2019-03-19 NOTE — Telephone Encounter (Signed)
-----   Message from Lanae Boast, [place] sent at 03/19/2019  8:07 AM EDT ----- The  labs are stable without significant clinical change.  All other results are normal or within acceptable limits. No Medication changes at the present time. Please return for your follow up appts.

## 2019-03-19 NOTE — Telephone Encounter (Signed)
Called and spoke with patient. Advised that all labs are stable and no medication changes will be made at this time. Thanks!

## 2019-03-20 ENCOUNTER — Other Ambulatory Visit: Payer: Self-pay

## 2019-03-20 ENCOUNTER — Other Ambulatory Visit: Payer: No Typology Code available for payment source

## 2019-03-31 ENCOUNTER — Encounter: Payer: Self-pay | Admitting: Family Medicine

## 2019-03-31 ENCOUNTER — Ambulatory Visit: Payer: Medicare Other | Admitting: Family Medicine

## 2019-03-31 ENCOUNTER — Other Ambulatory Visit: Payer: Self-pay

## 2019-03-31 VITALS — BP 138/78 | Ht 69.0 in | Wt 148.0 lb

## 2019-03-31 DIAGNOSIS — G8929 Other chronic pain: Secondary | ICD-10-CM

## 2019-03-31 DIAGNOSIS — M25561 Pain in right knee: Secondary | ICD-10-CM

## 2019-03-31 NOTE — Patient Instructions (Signed)
Your pain is due to arthritis. These are the different medications you can take for this: Tylenol 500mg  1-2 tabs three times a day for pain. Capsaicin, aspercreme, or biofreeze topically up to four times a day may also help with pain. Some supplements that may help for arthritis: Boswellia extract, curcumin, pycnogenol Meloxicam daily with food. Cortisone injections are an option. If cortisone injections do not help, there are different types of shots that may help but they take longer to take effect - we will look into these gel shots for you. It's important that you continue to stay active. Straight leg raises, knee extensions 3 sets of 10 once a day (add ankle weight if these become too easy). Consider physical therapy to strengthen muscles around the joint that hurts to take pressure off of the joint itself. Shoe inserts with good arch support may be helpful. Heat or ice 15 minutes at a time 3-4 times a day as needed to help with pain. Water aerobics and cycling with low resistance are the best two types of exercise for arthritis though any exercise is ok as long as it doesn't worsen the pain. Follow up with me as needed; let me know if you want to proceed with any of the above.

## 2019-04-01 ENCOUNTER — Encounter: Payer: Self-pay | Admitting: Family Medicine

## 2019-04-01 NOTE — Progress Notes (Signed)
PCP and consultation requested by: Lanae Boast, FNP  Subjective:   HPI: Patient is a 63 y.o. male here for right knee pain.  Patient reports he's had problems with his right knee for several years. Had a partial meniscectomy back in 2009 he believes. Past 6 months pain has been worse anterior right knee. Knee popping as well. Pain level 9/10 and sharp. Brace helps. Has tried meloxicam, voltaren gel, and horse linament with mild benefit. Not interested in injection - states last one didn't help for very long. No skin changes, numbness.  Past Medical History:  Diagnosis Date  . Cervical spondylosis with myelopathy and radiculopathy   . Chronic back pain    radiculopathy and stenosis  . Hypertension    takes AMlodipine daily  . Joint swelling    right knee  . Weakness    numbness in both legs when walking    Current Outpatient Medications on File Prior to Visit  Medication Sig Dispense Refill  . amLODipine (NORVASC) 10 MG tablet Take 1 tablet (10 mg total) by mouth daily. 90 tablet 1  . diclofenac sodium (VOLTAREN) 1 % GEL Apply 2 g topically 4 (four) times daily. 150 g 2  . docusate sodium (COLACE) 100 MG capsule Take 1 capsule (100 mg total) by mouth 2 (two) times daily. (Patient not taking: Reported on 09/02/2018) 60 capsule 0  . meloxicam (MOBIC) 7.5 MG tablet Take 1 tablet (7.5 mg total) by mouth daily. 30 tablet 1  . methocarbamol (ROBAXIN) 500 MG tablet Take 1 tablet (500 mg total) by mouth every 8 (eight) hours as needed for muscle spasms. 60 tablet 0  . Multiple Vitamin (MULTIVITAMIN WITH MINERALS) TABS tablet Take 1 tablet by mouth daily.    Marland Kitchen oxyCODONE (OXY IR/ROXICODONE) 5 MG immediate release tablet Take 1 tablet (5 mg total) by mouth every 4 (four) hours as needed for moderate pain ((score 4 to 6)). (Patient not taking: Reported on 09/02/2018) 30 tablet 0   No current facility-administered medications on file prior to visit.     Past Surgical History:   Procedure Laterality Date  . ANTERIOR CERVICAL DECOMP/DISCECTOMY FUSION N/A 03/11/2018   Procedure: ANTERIOR CERVICAL DECOMPRESSION/DISCECTOMY FUSION, INTERBODY PROSTHESIS, PLATE/SCREWS CERVICAL THREE- CERVICAL FOUR;  Surgeon: Newman Pies, MD;  Location: [facility];  Service: Neurosurgery;  Laterality: N/A;  ANTERIOR CERVICAL DECOMPRESSION/DISCECTOMY FUSION, INTERBODY PROSTHESIS, PLATE/SCREWS CERVICAL THREE- CERVICAL FOUR  . COLONOSCOPY    . KNEE ARTHROSCOPY Right 2011    Allergies  Allergen Reactions  . Flexeril [Cyclobenzaprine] Other (See Comments)    Gives him weird thoughts  . Other     NO BLOOD PRODUCTS. (pt will accept Albumin)    Social History   Socioeconomic History  . Marital status: Married    Spouse name: Not on file  . Number of children: Not on file  . Years of education: Not on file  . Highest education level: Not on file  Occupational History  . Not on file  Social Needs  . Financial resource strain: Not on file  . Food insecurity    Worry: Not on file    Inability: Not on file  . Transportation needs    Medical: Not on file    Non-medical: Not on file  Tobacco Use  . Smoking status: Current Every Day Smoker    Packs/day: 0.50    Years: 20.00    Pack years: 10.00    Types: Cigarettes  . Smokeless tobacco: Never Used  . Tobacco comment: 3  packs a week  Substance and Sexual Activity  . Alcohol use: Yes    Alcohol/week: 2.0 standard drinks    Types: 2 Cans of beer per week    Comment: daily  . Drug use: Yes    Types: Marijuana  . Sexual activity: Not on file  Lifestyle  . Physical activity    Days per week: Not on file    Minutes per session: Not on file  . Stress: Not on file  Relationships  . Social Musicianconnections    Talks on phone: Not on file    Gets together: Not on file    Attends religious service: Not on file    Active member of club or organization: Not on file    Attends meetings of clubs or organizations: Not on file    Relationship  status: Not on file  . Intimate partner violence    Fear of current or ex partner: Not on file    Emotionally abused: Not on file    Physically abused: Not on file    Forced sexual activity: Not on file  Other Topics Concern  . Not on file  Social History Narrative  . Not on file    Family History  Problem Relation Age of Onset  . Cancer Mother   . Heart disease Father   . Diabetes Brother     BP 138/78   Ht 5\' 9"  (1.753 m)   Wt 148 lb (67.1 kg)   BMI 21.86 kg/m   Review of Systems: See HPI above.     Objective:  Physical Exam:  Gen: NAD, comfortable in exam room  Right knee: Mod effusion.  No other deformity, bruising. TTP medial joint line mildly.  No other tenderness. Lacks about 5 degrees of extension.  Flexion to 100 degrees.  5/5 strength flexion and extension. Negative ant/post drawers. Negative valgus/varus testing. Negative lachmans. Negative mcmurrays, apleys, patellar apprehension. NV intact distally.  Left knee: No deformity. FROM with 5/5 strength. No tenderness to palpation. NVI distally.   Assessment & Plan:  1. Right knee pain - independently reviewed radiographs from last year noting severe arthritis.  History and exam consistent with this as cause of pain.  We discussed conservative and more aggressive options.  He would like to continue with conservative measures: tylenol, topical medications, meloxicam.  We will look into viscosupplementation cost for him though.  Heat or ice.  F/u prn.

## 2019-05-05 ENCOUNTER — Telehealth: Payer: Self-pay

## 2019-05-05 DIAGNOSIS — M25561 Pain in right knee: Secondary | ICD-10-CM

## 2019-05-05 DIAGNOSIS — G8929 Other chronic pain: Secondary | ICD-10-CM

## 2019-05-05 DIAGNOSIS — I10 Essential (primary) hypertension: Secondary | ICD-10-CM

## 2019-05-05 MED ORDER — MELOXICAM 7.5 MG PO TABS: 7.5000 mg | ORAL_TABLET | Freq: Every day | ORAL | 1 refills | Status: DC

## 2019-05-05 MED ORDER — AMLODIPINE BESYLATE 10 MG PO TABS: 10.0000 mg | ORAL_TABLET | Freq: Every day | ORAL | 1 refills | Status: DC

## 2019-05-05 MED FILL — MELOXICAM 7.5 MG TABLET: 7.5 | 30 days supply | Qty: 30 | Fill #0

## 2019-05-05 MED FILL — AMLODIPINE BESYLATE 10 MG T: 10 | 30 days supply | Qty: 30 | Fill #0

## 2019-05-05 NOTE — Telephone Encounter (Signed)
[facility] Thanks!

## 2019-06-04 ENCOUNTER — Encounter (HOSPITAL_COMMUNITY): Payer: Self-pay

## 2019-06-04 ENCOUNTER — Encounter (HOSPITAL_COMMUNITY): Payer: Self-pay | Admitting: *Deleted

## 2019-06-12 ENCOUNTER — Ambulatory Visit: Payer: No Typology Code available for payment source | Admitting: Family Medicine

## 2019-07-29 ENCOUNTER — Other Ambulatory Visit: Payer: Self-pay | Admitting: Orthopedic Surgery

## 2019-08-05 NOTE — Patient Instructions (Addendum)
DUE TO COVID-19 ONLY ONE VISITOR IS ALLOWED TO COME WITH YOU AND STAY IN THE WAITING ROOM ONLY DURING PRE OP AND PROCEDURE DAY OF SURGERY. THE 1 VISITOR MAY VISIT WITH YOU AFTER SURGERY IN YOUR PRIVATE ROOM DURING VISITING HOURS ONLY!  YOU NEED TO HAVE A COVID 19 TEST ON_Thursday 11/12/2020______ , THIS TEST MUST BE DONE BEFORE SURGERY, COME  [address], [place] , [address].  [facility]) ONCE YOUR COVID TEST IS COMPLETED, PLEASE BEGIN THE QUARANTINE INSTRUCTIONS AS OUTLINED IN YOUR HANDOUT.                David Keith   Your procedure is scheduled on: Monday 08/11/2019   Report to [facility] Main  Entrance    Report to admitting at 8:45am.     Call this number if you have problems the morning of surgery [phone]    Remember: Do not eat food or drink liquids after Midnight. NO SOLID FOOD AFTER MIDNIGHT THE NIGHT PRIOR TO SURGERY. NOTHING BY MOUTH EXCEPT CLEAR LIQUIDS UNTIL 8:15 AM. PLEASE FINISH ENSURE DRINK PER SURGEON ORDER  WHICH NEEDS TO BE COMPLETED AT 8:15 AM.   CLEAR LIQUID DIET   Foods Allowed                                                                     Foods Excluded  Coffee and tea, regular and decaf                             liquids that you cannot  Plain Jell-O any favor except red or purple             see through such as: Fruit ices (not with fruit pulp)                                     milk, soups, orange juice  Iced Popsicles                                    All solid food Carbonated beverages, regular and diet                                    Cranberry, grape and apple juices Sports drinks like Gatorade Lightly seasoned clear broth or consume(fat free) Sugar, honey syrup  Sample Menu Breakfast                                Lunch                                     Supper Cranberry juice                    Beef broth  Chicken broth Jell-O                                      Grape juice                           Apple juice Coffee or tea                        Jell-O                                      Popsicle                                                Coffee or tea                        Coffee or tea  _____________________________________________________________________       Take these medicines the morning of surgery with A SIP OF WATER: Amlodipine (Norvasc)   BRUSH YOUR TEETH MORNING OF SURGERY AND RINSE YOUR MOUTH OUT, NO CHEWING GUM CANDY OR MINTS.                                You may not have any metal on your body including hair pins and              piercings     Do not wear jewelry, make-up, lotions, powders or cologne, deodorant                          Men may shave face and neck.   Do not bring valuables to the hospital. [facility] IS NOT             RESPONSIBLE   FOR VALUABLES.  Contacts, dentures or bridgework may not be worn into surgery.  Leave suitcase in the car. After surgery it may be brought to your room.                  Please read over the following fact sheets you were given: _____________________________________________________________________             [facility] - Preparing for Surgery Before surgery, you can play an important role.  Because skin is not sterile, your skin needs to be as free of germs as possible.  You can reduce the number of germs on your skin by washing with CHG (chlorahexidine gluconate) soap before surgery.  CHG is an antiseptic cleaner which kills germs and bonds with the skin to continue killing germs even after washing. Please DO NOT use if you have an allergy to CHG or antibacterial soaps.  If your skin becomes reddened/irritated stop using the CHG and inform your nurse when you arrive at Short Stay. Do not shave (including legs and underarms) for at least 48 hours prior to the first CHG shower.  You may shave your face/neck. Please follow these instructions carefully:  1.   Shower with CHG Soap the night before surgery and the  morning of Surgery.  2.  If you choose to wash your hair, wash your hair first as usual with your  normal  shampoo.  3.  After you shampoo, rinse your hair and body thoroughly to remove the  shampoo.                            4.  Use CHG as you would any other liquid soap.  You can apply chg directly  to the skin and wash                       Gently with a scrungie or clean washcloth.  5.  Apply the CHG Soap to your body ONLY FROM THE NECK DOWN.   Do not use on face/ open                           Wound or open sores. Avoid contact with eyes, ears mouth and genitals (private parts).                       Wash face,  Genitals (private parts) with your normal soap.             6.  Wash thoroughly, paying special attention to the area where your surgery  will be performed.  7.  Thoroughly rinse your body with warm water from the neck down.  8.  DO NOT shower/wash with your normal soap after using and rinsing off  the CHG Soap.                9.  Pat yourself dry with a clean towel.            10.  Wear clean pajamas.            11.  Place clean sheets on your bed the night of your first shower and do not  sleep with pets. Day of Surgery : Do not apply any lotions/deodorants the morning of surgery.  Please wear clean clothes to the hospital/surgery center.  FAILURE TO FOLLOW THESE INSTRUCTIONS MAY RESULT IN THE CANCELLATION OF YOUR SURGERY PATIENT SIGNATURE_________________________________  NURSE SIGNATURE__________________________________  ________________________________________________________________________   David Keith  An incentive spirometer is a tool that can help keep your lungs clear and active. This tool measures how well you are filling your lungs with each breath. Taking long deep breaths may help reverse or decrease the chance of developing breathing (pulmonary) problems (especially infection) following:  A  long period of time when you are unable to move or be active. BEFORE THE PROCEDURE   If the spirometer includes an indicator to show your best effort, your nurse or respiratory therapist will set it to a desired goal.  If possible, sit up straight or lean slightly forward. Try not to slouch.  Hold the incentive spirometer in an upright position. INSTRUCTIONS FOR USE  1. Sit on the edge of your bed if possible, or sit up as far as you can in bed or on a chair. 2. Hold the incentive spirometer in an upright position. 3. Breathe out normally. 4. Place the mouthpiece in your mouth and seal your lips tightly around it. 5. Breathe in slowly and as deeply as possible, raising the piston or the ball toward the top of the column. 6. Hold your breath for 3-5 seconds or for as long as  possible. Allow the piston or ball to fall to the bottom of the column. 7. Remove the mouthpiece from your mouth and breathe out normally. 8. Rest for a few seconds and repeat Steps 1 through 7 at least 10 times every 1-2 hours when you are awake. Take your time and take a few normal breaths between deep breaths. 9. The spirometer may include an indicator to show your best effort. Use the indicator as a goal to work toward during each repetition. 10. After each set of 10 deep breaths, practice coughing to be sure your lungs are clear. If you have an incision (the cut made at the time of surgery), support your incision when coughing by placing a pillow or rolled up towels firmly against it. Once you are able to get out of bed, walk around indoors and cough well. You may stop using the incentive spirometer when instructed by your caregiver.  RISKS AND COMPLICATIONS  Take your time so you do not get dizzy or light-headed.  If you are in pain, you may need to take or ask for pain medication before doing incentive spirometry. It is harder to take a deep breath if you are having pain. AFTER USE  Rest and breathe slowly and  easily.  It can be helpful to keep track of a log of your progress. Your caregiver can provide you with a simple table to help with this. If you are using the spirometer at home, follow these instructions: SEEK MEDICAL CARE IF:   You are having difficultly using the spirometer.  You have trouble using the spirometer as often as instructed.  Your pain medication is not giving enough relief while using the spirometer.  You develop fever of 100.5 F (38.1 C) or higher. SEEK IMMEDIATE MEDICAL CARE IF:   You cough up bloody sputum that had not been present before.  You develop fever of 102 F (38.9 C) or greater.  You develop worsening pain at or near the incision site. MAKE SURE YOU:   Understand these instructions.  Will watch your condition.  Will get help right away if you are not doing well or get worse. Document Released: 01/22/2007 Document Revised: 12/04/2011 Document Reviewed: 03/25/2007 [facility] Patient Information 2014 [place], [place].   ________________________________________________________________________

## 2019-08-06 ENCOUNTER — Inpatient Hospital Stay (HOSPITAL_COMMUNITY)
Admission: RE | Admit: 2019-08-06 | Discharge: 2019-08-06 | Disposition: A | Discharge status: Home / Self Care | Payer: Medicare Other | Source: Ambulatory Visit

## 2019-08-07 ENCOUNTER — Encounter (HOSPITAL_COMMUNITY): Payer: Self-pay

## 2019-08-07 ENCOUNTER — Other Ambulatory Visit (HOSPITAL_COMMUNITY): Payer: Medicare Other

## 2019-08-07 ENCOUNTER — Other Ambulatory Visit: Payer: Self-pay

## 2019-08-07 ENCOUNTER — Encounter (HOSPITAL_COMMUNITY)
Admission: RE | Admit: 2019-08-07 | Discharge: 2019-08-07 | Disposition: A | Discharge status: Home / Self Care | Payer: Medicare Other | Source: Ambulatory Visit | Attending: Orthopedic Surgery | Admitting: Orthopedic Surgery

## 2019-08-07 ENCOUNTER — Ambulatory Visit (HOSPITAL_COMMUNITY)
Admission: RE | Admit: 2019-08-07 | Discharge: 2019-08-07 | Disposition: A | Discharge status: Home / Self Care | Payer: Medicare Other | Source: Ambulatory Visit | Attending: Orthopedic Surgery | Admitting: Orthopedic Surgery

## 2019-08-07 ENCOUNTER — Other Ambulatory Visit (HOSPITAL_COMMUNITY)
Admission: RE | Admit: 2019-08-07 | Discharge: 2019-08-07 | Disposition: A | Discharge status: Home / Self Care | Payer: Medicare Other | Source: Ambulatory Visit

## 2019-08-07 DIAGNOSIS — Z20828 Contact with and (suspected) exposure to other viral communicable diseases: Secondary | ICD-10-CM | POA: Diagnosis not present

## 2019-08-07 DIAGNOSIS — Z01818 Encounter for other preprocedural examination: Secondary | ICD-10-CM

## 2019-08-07 DIAGNOSIS — M1711 Unilateral primary osteoarthritis, right knee: Secondary | ICD-10-CM | POA: Diagnosis not present

## 2019-08-07 DIAGNOSIS — F17210 Nicotine dependence, cigarettes, uncomplicated: Secondary | ICD-10-CM | POA: Insufficient documentation

## 2019-08-07 DIAGNOSIS — Z7982 Long term (current) use of aspirin: Secondary | ICD-10-CM | POA: Insufficient documentation

## 2019-08-07 DIAGNOSIS — I10 Essential (primary) hypertension: Secondary | ICD-10-CM | POA: Diagnosis not present

## 2019-08-07 DIAGNOSIS — Z79899 Other long term (current) drug therapy: Secondary | ICD-10-CM | POA: Diagnosis not present

## 2019-08-07 HISTORY — DX: Inflammatory liver disease, unspecified: K75.9

## 2019-08-07 LAB — CBC WITH DIFFERENTIAL/PLATELET
Abs Immature Granulocytes: 0.01 10*3/uL (ref 0.00–0.07)
Basophils Absolute: 0 10*3/uL (ref 0.0–0.1)
Basophils Relative: 1 %
Eosinophils Absolute: 0.1 10*3/uL (ref 0.0–0.5)
Eosinophils Relative: 1 %
HCT: 43.1 % (ref 39.0–52.0)
Hemoglobin: 14 g/dL (ref 13.0–17.0)
Immature Granulocytes: 0 %
Lymphocytes Relative: 22 %
Lymphs Abs: 1 10*3/uL (ref 0.7–4.0)
MCH: 30.2 pg (ref 26.0–34.0)
MCHC: 32.5 g/dL (ref 30.0–36.0)
MCV: 92.9 fL (ref 80.0–100.0)
Monocytes Absolute: 0.5 10*3/uL (ref 0.1–1.0)
Monocytes Relative: 11 %
Neutro Abs: 3.1 10*3/uL (ref 1.7–7.7)
Neutrophils Relative %: 65 %
Platelets: 300 10*3/uL (ref 150–400)
RBC: 4.64 MIL/uL (ref 4.22–5.81)
RDW: 15.5 % (ref 11.5–15.5)
WBC: 4.8 10*3/uL (ref 4.0–10.5)
nRBC: 0 % (ref 0.0–0.2)

## 2019-08-07 LAB — COMPREHENSIVE METABOLIC PANEL
ALT: 26 U/L (ref 0–44)
AST: 36 U/L (ref 15–41)
Albumin: 4 g/dL (ref 3.5–5.0)
Alkaline Phosphatase: 70 U/L (ref 38–126)
Anion gap: 7 (ref 5–15)
BUN: 9 mg/dL (ref 8–23)
CO2: 26 mmol/L (ref 22–32)
Calcium: 9.5 mg/dL (ref 8.9–10.3)
Chloride: 108 mmol/L (ref 98–111)
Creatinine, Ser: 0.91 mg/dL (ref 0.61–1.24)
GFR calc Af Amer: >60 mL/min (ref >60)
GFR calc non Af Amer: >60 mL/min (ref >60)
Glucose, Bld: 113 mg/dL — ABNORMAL HIGH (ref 70–99)
Potassium: 4.8 mmol/L (ref 3.5–5.1)
Sodium: 141 mmol/L (ref 135–145)
Total Bilirubin: 0.4 mg/dL (ref 0.3–1.2)
Total Protein: 7.4 g/dL (ref 6.5–8.1)

## 2019-08-07 LAB — URINALYSIS, ROUTINE W REFLEX MICROSCOPIC
Bilirubin Urine: NEGATIVE
Glucose, UA: NEGATIVE mg/dL
Hgb urine dipstick: NEGATIVE
Ketones, ur: 5 mg/dL — AB
Leukocytes,Ua: NEGATIVE
Nitrite: NEGATIVE
Protein, ur: NEGATIVE mg/dL
Specific Gravity, Urine: 1.02 (ref 1.005–1.030)
pH: 6 (ref 5.0–8.0)

## 2019-08-07 LAB — SURGICAL PCR SCREEN
MRSA, PCR: NEGATIVE
Staphylococcus aureus: NEGATIVE

## 2019-08-07 LAB — PROTIME-INR
INR: 1 (ref 0.8–1.2)
Prothrombin Time: 12.6 seconds (ref 11.4–15.2)

## 2019-08-07 LAB — APTT: aPTT: 30 seconds (ref 24–36)

## 2019-08-07 NOTE — Patient Instructions (Signed)
DUE TO COVID-19 ONLY ONE VISITOR IS ALLOWED TO COME WITH YOU AND STAY IN THE WAITING ROOM ONLY DURING PRE OP AND PROCEDURE DAY OF SURGERY. THE 1 VISITOR MAY VISIT WITH YOU AFTER SURGERY IN YOUR PRIVATE ROOM DURING VISITING HOURS ONLY!  YOU NEED TO HAVE A COVID 19 TEST ON___11-08-2019 , immediately after pre-op appt ___ @_______ , THIS TEST MUST BE DONE BEFORE SURGERY, COME  [address], [place] , .  [facility]) ONCE YOUR COVID TEST IS COMPLETED, PLEASE BEGIN THE QUARANTINE INSTRUCTIONS AS OUTLINED IN YOUR HANDOUT.                David Keith    Your procedure is scheduled on: 08-11-2019   Report to [facility] Main  Entrance   Report to admitting at 8:45AM     Call this number if you have problems the morning of surgery [phone]      Do not eat food After Midnight. YOU MAY HAVE CLEAR LIQUIDS FROM MIDNIGHT UNTIL 8:15AM. At 8:15AM Please finish the prescribed Pre-Surgery ENSURE drink. Nothing by mouth after you finish the ENSURE drink !   CLEAR LIQUID DIET   Foods Allowed                                                                     Foods Excluded  Coffee and tea, regular and decaf                             liquids that you cannot  Plain Jell-O any favor except red or purple                                           see through such as: Fruit ices (not with fruit pulp)                                     milk, soups, orange juice  Iced Popsicles                                    All solid food Carbonated beverages, regular and diet                                    Cranberry, grape and apple juices Sports drinks like Gatorade Lightly seasoned clear broth or consume(fat free) Sugar, honey syrup  Sample Menu Breakfast                                Lunch                                     Supper Cranberry juice  Beef broth                            Chicken broth Jell-O                                      Grape juice                           Apple juice Coffee or tea                        Jell-O                                      Popsicle                                                Coffee or tea                        Coffee or tea  _____________________________________________________________________  BRUSH YOUR TEETH MORNING OF SURGERY AND RINSE YOUR MOUTH OUT, NO CHEWING GUM CANDY OR MINTS.     Take these medicines the morning of surgery with A SIP OF WATER: Amlodipine                                  You may not have any metal on your body including hair pins and              piercings  Do not wear jewelry, make-up, lotions, powders or perfumes, deodorant                      Men may shave face and neck.   Do not bring valuables to the hospital. [place].  Contacts, dentures or bridgework may not be worn into surgery.  YOU MAY BRING A SMALL OVERNIGHT BAG                   Please read over the following fact sheets you were given: _____________________________________________________________________             [facility] - Preparing for Surgery Before surgery, you can play an important role.  Because skin is not sterile, your skin needs to be as free of germs as possible.  You can reduce the number of germs on your skin by washing with CHG (chlorahexidine gluconate) soap before surgery.  CHG is an antiseptic cleaner which kills germs and bonds with the skin to continue killing germs even after washing. Please DO NOT use if you have an allergy to CHG or antibacterial soaps.  If your skin becomes reddened/irritated stop using the CHG and inform your nurse when you arrive at Short Stay. Do not shave (including legs and underarms) for at least 48 hours prior to the first CHG shower.  You may shave your face/neck. Please follow these instructions carefully:  1.  Shower with CHG Soap the night before surgery and the   morning of Surgery.  2.  If you choose to wash your hair, wash your hair first as usual with your  normal  shampoo.  3.  After you shampoo, rinse your hair and body thoroughly to remove the  shampoo.                           4.  Use CHG as you would any other liquid soap.  You can apply chg directly  to the skin and wash                       Gently with a scrungie or clean washcloth.  5.  Apply the CHG Soap to your body ONLY FROM THE NECK DOWN.   Do not use on face/ open                           Wound or open sores. Avoid contact with eyes, ears mouth and genitals (private parts).                       Wash face,  Genitals (private parts) with your normal soap.             6.  Wash thoroughly, paying special attention to the area where your surgery  will be performed.  7.  Thoroughly rinse your body with warm water from the neck down.  8.  DO NOT shower/wash with your normal soap after using and rinsing off  the CHG Soap.                9.  Pat yourself dry with a clean towel.            10.  Wear clean pajamas.            11.  Place clean sheets on your bed the night of your first shower and do not  sleep with pets. Day of Surgery : Do not apply any lotions/deodorants the morning of surgery.  Please wear clean clothes to the hospital/surgery center.  FAILURE TO FOLLOW THESE INSTRUCTIONS MAY RESULT IN THE CANCELLATION OF YOUR SURGERY PATIENT SIGNATURE_________________________________  NURSE SIGNATURE__________________________________  ________________________________________________________________________   David Keith  An incentive spirometer is a tool that can help keep your lungs clear and active. This tool measures how well you are filling your lungs with each breath. Taking long deep breaths may help reverse or decrease the chance of developing breathing (pulmonary) problems (especially infection) following:  A long period of time when you are unable to move or be  active. BEFORE THE PROCEDURE   If the spirometer includes an indicator to show your best effort, your nurse or respiratory therapist will set it to a desired goal.  If possible, sit up straight or lean slightly forward. Try not to slouch.  Hold the incentive spirometer in an upright position. INSTRUCTIONS FOR USE  1. Sit on the edge of your bed if possible, or sit up as far as you can in bed or on a chair. 2. Hold the incentive spirometer in an upright position. 3. Breathe out normally. 4. Place the mouthpiece in your mouth and seal your lips tightly around it. 5. Breathe in slowly and as deeply as possible, raising the piston or the ball toward the top of the  column. 6. Hold your breath for 3-5 seconds or for as long as possible. Allow the piston or ball to fall to the bottom of the column. 7. Remove the mouthpiece from your mouth and breathe out normally. 8. Rest for a few seconds and repeat Steps 1 through 7 at least 10 times every 1-2 hours when you are awake. Take your time and take a few normal breaths between deep breaths. 9. The spirometer may include an indicator to show your best effort. Use the indicator as a goal to work toward during each repetition. 10. After each set of 10 deep breaths, practice coughing to be sure your lungs are clear. If you have an incision (the cut made at the time of surgery), support your incision when coughing by placing a pillow or rolled up towels firmly against it. Once you are able to get out of bed, walk around indoors and cough well. You may stop using the incentive spirometer when instructed by your caregiver.  RISKS AND COMPLICATIONS  Take your time so you do not get dizzy or light-headed.  If you are in pain, you may need to take or ask for pain medication before doing incentive spirometry. It is harder to take a deep breath if you are having pain. AFTER USE  Rest and breathe slowly and easily.  It can be helpful to keep track of a log of  your progress. Your caregiver can provide you with a simple table to help with this. If you are using the spirometer at home, follow these instructions: [place] IF:   You are having difficultly using the spirometer.  You have trouble using the spirometer as often as instructed.  Your pain medication is not giving enough relief while using the spirometer.  You develop fever of 100.5 F (38.1 C) or higher. SEEK IMMEDIATE MEDICAL CARE IF:   You cough up bloody sputum that had not been present before.  You develop fever of 102 F (38.9 C) or greater.  You develop worsening pain at or near the incision site. MAKE SURE YOU:   Understand these instructions.  Will watch your condition.  Will get help right away if you are not doing well or get worse. Document Released: 01/22/2007 Document Revised: 12/04/2011 Document Reviewed: 03/25/2007 [facility] Patient Information 2014 [place], [place].   ________________________________________________________________________

## 2019-08-07 NOTE — Progress Notes (Signed)
PCP - Lanae Boast, FNP at [facility] Cardiologist -   Chest x-ray - done today  EKG - done today  Stress Test -  ECHO -  Cardiac Cath -   Sleep Study -  CPAP -   Fasting Blood Sugar -  Checks Blood Sugar _____ times a day  Blood Thinner Instructions: Aspirin Instructions: ASA 81mg , today states he needs to recheck the paperwork from [place] office  Last Dose:  Anesthesia review:   Reports his PCP is Lanae [facility] and that is who told him he has hepatitis C. Patient however doubtful of this diagnosis , stating "i've been married for 40 years , I don't know how ". RN explained the primary mode of hep C transmission is blood borne and provided examples such as IV drug use, blood transfusion etc. Patient reports he has not undergone any medical treatment for the Hep C and last saw the provider earlier this year.  Per review of encounter patient was dx 01-2018 via hep c screening and sent to infectious disease but missed appt, referral was sent again but it seems patient never went per his statements today. PA to be made aware. surgeon BMP order cancelled and replaced with order for CMP per anesthesia order set guidelines    Patient denies shortness of breath, fever, cough and chest pain at PAT appointment   Patient verbalized understanding of instructions that were given to them at the PAT appointment. Patient was also instructed that they will need to review over the PAT instructions again at home before surgery.

## 2019-08-07 NOTE — H&P (Signed)
TOTAL KNEE ADMISSION H&P  Patient is being admitted for right total knee arthroplasty.  Subjective:  Chief Complaint:right knee pain.  HPI: David Keith, 63 y.o. male, has a history of pain and functional disability in the right knee due to arthritis and has failed non-surgical conservative treatments for greater than 12 weeks to includeNSAID's and/or analgesics, weight reduction as appropriate and activity modification.  Onset of symptoms was gradual, starting >10 years ago with gradually worsening course since that time. The patient noted prior procedures on the knee to include  arthroscopy on the right knee(s).  Patient currently rates pain in the right knee(s) at 10 out of 10 with activity. Patient has night pain, worsening of pain with activity and weight bearing, pain that interferes with activities of daily living, pain with passive range of motion, crepitus and joint swelling.  Patient has evidence of subchondral sclerosis and joint space narrowing by imaging studies.   There is no active infection.  Patient Active Problem List   Diagnosis Date Noted  . Osteoarthritis of right knee 08/07/2019  . Cervical herniated disc 03/11/2018  . Benign essential HTN   . Tobacco abuse   . ETOH abuse   . Chronic back pain   . Hypokalemia   . Acute blood loss anemia   . Weakness 02/05/2018  . Ataxia 02/05/2018  . Left arm weakness   . Hepatitis C 08/23/2017  . Tobacco dependence 03/10/2016  . Knee swelling 02/09/2016  . Right knee pain 02/09/2016  . Spondylolisthesis of lumbar region 03/10/2015  . Inj musc/tend the rotator cuff of left shoulder, init 07/29/2014  . Hypertension 01/21/2012  . Smoking 01/04/2012   Past Medical History:  Diagnosis Date  . Cervical spondylosis with myelopathy and radiculopathy   . Chronic back pain    radiculopathy and stenosis  . Hypertension    takes AMlodipine daily  . Joint swelling    right knee  . Weakness    numbness in both legs when walking     Past Surgical History:  Procedure Laterality Date  . ANTERIOR CERVICAL DECOMP/DISCECTOMY FUSION N/A 03/11/2018   Procedure: ANTERIOR CERVICAL DECOMPRESSION/DISCECTOMY FUSION, INTERBODY PROSTHESIS, PLATE/SCREWS CERVICAL THREE- CERVICAL FOUR;  Surgeon: Tressie Stalker, MD;  Location: [facility] OR;  Service: Neurosurgery;  Laterality: N/A;  ANTERIOR CERVICAL DECOMPRESSION/DISCECTOMY FUSION, INTERBODY PROSTHESIS, PLATE/SCREWS CERVICAL THREE- CERVICAL FOUR  . COLONOSCOPY    . KNEE ARTHROSCOPY Right 2011    No current facility-administered medications for this encounter.    Current Outpatient Medications  Medication Sig Dispense Refill Last Dose  . amLODipine (NORVASC) 10 MG tablet Take 1 tablet (10 mg total) by mouth daily. 90 tablet 1   . aspirin EC 81 MG tablet Take 81 mg by mouth daily.     . diclofenac sodium (VOLTAREN) 1 % GEL Apply 2 g topically 4 (four) times daily. (Patient taking differently: Apply 2 g topically 4 (four) times daily as needed (pain.). ) 150 g 2   . meloxicam (MOBIC) 7.5 MG tablet Take 1 tablet (7.5 mg total) by mouth daily. 30 tablet 1   . methocarbamol (ROBAXIN) 500 MG tablet Take 1 tablet (500 mg total) by mouth every 8 (eight) hours as needed for muscle spasms. 60 tablet 0   . Multiple Vitamin (MULTIVITAMIN WITH MINERALS) TABS tablet Take 1 tablet by mouth daily.      Allergies  Allergen Reactions  . Flexeril [Cyclobenzaprine] Other (See Comments)    Gives him weird thoughts  . Other  NO BLOOD PRODUCTS. (pt will accept Albumin)    Social History   Tobacco Use  . Smoking status: Current Every Day Smoker    Packs/day: 0.50    Years: 20.00    Pack years: 10.00    Types: Cigarettes  . Smokeless tobacco: Never Used  . Tobacco comment: 3 packs a week  Substance Use Topics  . Alcohol use: Yes    Alcohol/week: 2.0 standard drinks    Types: 2 Cans of beer per week    Comment: daily    Family History  Problem Relation Age of Onset  . Cancer Mother   . Heart  disease Father   . Diabetes Brother      Review of Systems  Constitutional: Negative.   HENT: Negative.   Respiratory: Negative.   Cardiovascular: Negative.   Gastrointestinal: Positive for nausea.  Genitourinary: Negative.   Musculoskeletal: Positive for joint pain.  Skin: Negative.   Neurological: Negative.   Endo/Heme/Allergies: Negative.   Psychiatric/Behavioral: Negative.     Objective:  Physical Exam  Constitutional: He is oriented to person, place, and time. He appears well-developed and well-nourished.  HENT:  Head: Normocephalic and atraumatic.  Eyes: Pupils are equal, round, and reactive to light.  Neck: Normal range of motion. Neck supple.  Cardiovascular: Intact distal pulses.  Respiratory: Effort normal.  Musculoskeletal:        General: Tenderness and deformity present.     Comments: Patient has impressive varus deformity of about 10 1+ laxity to valgus testing range of motion is 5/90 with an ouch there is palpable crepitus as you take him through range of motion is tender along the medial joint line and proximal flare of the tibia.  Neurovascular intact distally.  Toes are pink and well perfused.  Neurological: He is oriented to person, place, and time.  Skin: Skin is warm and dry.  Psychiatric: He has a normal mood and affect. His behavior is normal. Judgment and thought content normal.    Vital signs in last 24 hours: BP: ()/()  Arterial Line BP: ()/()   Labs:   Estimated body mass index is 21.86 kg/m as calculated from the following:   Height as of 03/31/19: 5\' 9"  (1.753 m).   Weight as of 03/31/19: 67.1 kg.   Imaging Review Plain radiographs demonstrate that he had at least 5 mm of erosion of the proximal tibia as well as the distal medial femoral condyle.   Assessment/Plan:  End stage arthritis, right knee   The patient history, physical examination, clinical judgment of the provider and imaging studies are consistent with end stage  degenerative joint disease of the right knee(s) and total knee arthroplasty is deemed medically necessary. The treatment options including medical management, injection therapy arthroscopy and arthroplasty were discussed at length. The risks and benefits of total knee arthroplasty were presented and reviewed. The risks due to aseptic loosening, infection, stiffness, patella tracking problems, thromboembolic complications and other imponderables were discussed. The patient acknowledged the explanation, agreed to proceed with the plan and consent was signed. Patient is being admitted for inpatient treatment for surgery, pain control, PT, OT, prophylactic antibiotics, VTE prophylaxis, progressive ambulation and ADL's and discharge planning. The patient is planning to be discharged home with home health services     Patient's anticipated LOS is less than 2 midnights, meeting these requirements: - Younger than 107 - Lives within 1 hour of care - Has a competent adult at home to recover with post-op recover - NO history  of  - Chronic pain requiring opiods  - Diabetes  - Coronary Artery Disease  - Heart failure  - Heart attack  - Stroke  - DVT/VTE  - Cardiac arrhythmia  - Respiratory Failure/COPD  - Renal failure  - Anemia  - Advanced Liver disease

## 2019-08-08 LAB — NOVEL CORONAVIRUS, NAA (HOSP ORDER, SEND-OUT TO REF LAB; TAT 18-24 HRS): SARS-CoV-2, NAA: NOT DETECTED

## 2019-08-08 LAB — NO BLOOD PRODUCTS

## 2019-08-10 MED ORDER — TRANEXAMIC ACID 1000 MG/10ML IV SOLN
2000.0000 mg | INTRAVENOUS | Status: DC
  Filled 2019-08-10: qty 20

## 2019-08-10 MED ORDER — BUPIVACAINE LIPOSOME 1.3 % IJ SUSP
20.0000 mL | Freq: Once | INTRAMUSCULAR | Status: DC
  Filled 2019-08-10 (×2): qty 20

## 2019-08-11 ENCOUNTER — Other Ambulatory Visit: Payer: Self-pay

## 2019-08-11 ENCOUNTER — Encounter (HOSPITAL_COMMUNITY)
Admission: RE | Disposition: A | Discharge status: Home / Self Care | Payer: Self-pay | Source: Other Acute Inpatient Hospital | Attending: Orthopedic Surgery

## 2019-08-11 ENCOUNTER — Observation Stay (HOSPITAL_COMMUNITY)
Admission: RE | Admit: 2019-08-11 | Discharge: 2019-08-12 | Disposition: A | Discharge status: Home / Self Care | Payer: Medicare Other | Source: Other Acute Inpatient Hospital | Attending: Orthopedic Surgery | Admitting: Orthopedic Surgery

## 2019-08-11 ENCOUNTER — Ambulatory Visit (HOSPITAL_COMMUNITY): Payer: Medicare Other | Admitting: Certified Registered"

## 2019-08-11 ENCOUNTER — Encounter (HOSPITAL_COMMUNITY): Payer: Self-pay

## 2019-08-11 ENCOUNTER — Ambulatory Visit (HOSPITAL_COMMUNITY): Payer: Medicare Other | Admitting: Physician Assistant

## 2019-08-11 DIAGNOSIS — E876 Hypokalemia: Secondary | ICD-10-CM | POA: Diagnosis not present

## 2019-08-11 DIAGNOSIS — Z888 Allergy status to other drugs, medicaments and biological substances status: Secondary | ICD-10-CM | POA: Diagnosis not present

## 2019-08-11 DIAGNOSIS — M1711 Unilateral primary osteoarthritis, right knee: Secondary | ICD-10-CM | POA: Diagnosis not present

## 2019-08-11 DIAGNOSIS — F1010 Alcohol abuse, uncomplicated: Secondary | ICD-10-CM | POA: Insufficient documentation

## 2019-08-11 DIAGNOSIS — F17210 Nicotine dependence, cigarettes, uncomplicated: Secondary | ICD-10-CM | POA: Diagnosis not present

## 2019-08-11 DIAGNOSIS — Z833 Family history of diabetes mellitus: Secondary | ICD-10-CM | POA: Insufficient documentation

## 2019-08-11 DIAGNOSIS — Z7982 Long term (current) use of aspirin: Secondary | ICD-10-CM | POA: Insufficient documentation

## 2019-08-11 DIAGNOSIS — I10 Essential (primary) hypertension: Secondary | ICD-10-CM | POA: Diagnosis not present

## 2019-08-11 DIAGNOSIS — Z8249 Family history of ischemic heart disease and other diseases of the circulatory system: Secondary | ICD-10-CM | POA: Diagnosis not present

## 2019-08-11 DIAGNOSIS — Z79899 Other long term (current) drug therapy: Secondary | ICD-10-CM | POA: Diagnosis not present

## 2019-08-11 DIAGNOSIS — Z8619 Personal history of other infectious and parasitic diseases: Secondary | ICD-10-CM | POA: Insufficient documentation

## 2019-08-11 DIAGNOSIS — M21061 Valgus deformity, not elsewhere classified, right knee: Secondary | ICD-10-CM | POA: Insufficient documentation

## 2019-08-11 DIAGNOSIS — Z981 Arthrodesis status: Secondary | ICD-10-CM | POA: Insufficient documentation

## 2019-08-11 DIAGNOSIS — M4316 Spondylolisthesis, lumbar region: Secondary | ICD-10-CM | POA: Insufficient documentation

## 2019-08-11 DIAGNOSIS — Z809 Family history of malignant neoplasm, unspecified: Secondary | ICD-10-CM | POA: Diagnosis not present

## 2019-08-11 DIAGNOSIS — Z791 Long term (current) use of non-steroidal anti-inflammatories (NSAID): Secondary | ICD-10-CM | POA: Insufficient documentation

## 2019-08-11 DIAGNOSIS — Z96651 Presence of right artificial knee joint: Secondary | ICD-10-CM

## 2019-08-11 HISTORY — PX: TOTAL KNEE ARTHROPLASTY: SHX125

## 2019-08-11 SURGERY — ARTHROPLASTY, KNEE, TOTAL
Anesthesia: Regional | Site: Knee | Laterality: Right

## 2019-08-11 MED ORDER — BUPIVACAINE-EPINEPHRINE 0.25% -1:200000 IJ SOLN
INTRAMUSCULAR | Status: AC
  Filled 2019-08-11: qty 1

## 2019-08-11 MED ORDER — HYDROMORPHONE HCL 1 MG/ML IJ SOLN: 0.5000 mg | INTRAMUSCULAR | Status: DC | PRN

## 2019-08-11 MED ORDER — TIZANIDINE HCL 2 MG PO TABS: 2.0000 mg | ORAL_TABLET | Freq: Four times a day (QID) | ORAL | 0 refills | Status: DC | PRN

## 2019-08-11 MED ORDER — ONDANSETRON HCL 4 MG/2ML IJ SOLN: 4.0000 mg | Freq: Four times a day (QID) | INTRAMUSCULAR | Status: DC | PRN

## 2019-08-11 MED ORDER — ONDANSETRON HCL 4 MG/2ML IJ SOLN: 4.0000 mg | Freq: Once | INTRAMUSCULAR | Status: DC | PRN

## 2019-08-11 MED ORDER — MIDAZOLAM HCL 2 MG/2ML IJ SOLN
1.0000 mg | Freq: Once | INTRAMUSCULAR | Status: AC
  Administered 2019-08-11: 2 mg via INTRAVENOUS
  Filled 2019-08-11: qty 2

## 2019-08-11 MED ORDER — FENTANYL CITRATE (PF) 100 MCG/2ML IJ SOLN
50.0000 ug | Freq: Once | INTRAMUSCULAR | Status: AC
  Administered 2019-08-11: 100 ug via INTRAVENOUS
  Filled 2019-08-11: qty 2

## 2019-08-11 MED ORDER — BUPIVACAINE-EPINEPHRINE (PF) 0.25% -1:200000 IJ SOLN
INTRAMUSCULAR | Status: DC | PRN
  Administered 2019-08-11: 30 mL

## 2019-08-11 MED ORDER — DOCUSATE SODIUM 100 MG PO CAPS
100.0000 mg | ORAL_CAPSULE | Freq: Two times a day (BID) | ORAL | Status: DC
  Filled 2019-08-11: qty 1

## 2019-08-11 MED ORDER — LIDOCAINE 2% (20 MG/ML) 5 ML SYRINGE
INTRAMUSCULAR | Status: AC
  Filled 2019-08-11: qty 5

## 2019-08-11 MED ORDER — CELECOXIB 200 MG PO CAPS
200.0000 mg | ORAL_CAPSULE | Freq: Two times a day (BID) | ORAL | Status: DC
  Administered 2019-08-11: 200 mg via ORAL
  Filled 2019-08-11: qty 1

## 2019-08-11 MED ORDER — PROPOFOL 500 MG/50ML IV EMUL
INTRAVENOUS | Status: AC
  Filled 2019-08-11: qty 50

## 2019-08-11 MED ORDER — PROPOFOL 500 MG/50ML IV EMUL
INTRAVENOUS | Status: DC | PRN
  Administered 2019-08-11: 50 ug/kg/min via INTRAVENOUS

## 2019-08-11 MED ORDER — ONDANSETRON HCL 4 MG PO TABS: 4.0000 mg | ORAL_TABLET | Freq: Four times a day (QID) | ORAL | Status: DC | PRN

## 2019-08-11 MED ORDER — ACETAMINOPHEN 325 MG PO TABS: 325.0000 mg | ORAL_TABLET | Freq: Four times a day (QID) | ORAL | Status: DC | PRN

## 2019-08-11 MED ORDER — WATER FOR IRRIGATION, STERILE IR SOLN
Status: DC | PRN
  Administered 2019-08-11: 1000 mL

## 2019-08-11 MED ORDER — OXYCODONE HCL 5 MG PO TABS: 5.0000 mg | ORAL_TABLET | Freq: Once | ORAL | Status: DC | PRN

## 2019-08-11 MED ORDER — POVIDONE-IODINE 10 % EX SWAB
2.0000 "application " | Freq: Once | CUTANEOUS | Status: AC
  Administered 2019-08-11: 2 via TOPICAL

## 2019-08-11 MED ORDER — LIDOCAINE 2% (20 MG/ML) 5 ML SYRINGE
INTRAMUSCULAR | Status: DC | PRN
  Administered 2019-08-11: 40 mg via INTRAVENOUS

## 2019-08-11 MED ORDER — ONDANSETRON HCL 4 MG/2ML IJ SOLN
INTRAMUSCULAR | Status: DC | PRN
  Administered 2019-08-11: 4 mg via INTRAVENOUS

## 2019-08-11 MED ORDER — OXYCODONE HCL 5 MG PO TABS
5.0000 mg | ORAL_TABLET | ORAL | Status: DC | PRN
  Administered 2019-08-11 – 2019-08-12 (×3): 10 mg via ORAL
  Filled 2019-08-11 (×3): qty 2

## 2019-08-11 MED ORDER — TRANEXAMIC ACID-NACL 1000-0.7 MG/100ML-% IV SOLN
1000.0000 mg | Freq: Once | INTRAVENOUS | Status: AC
  Administered 2019-08-11: 1000 mg via INTRAVENOUS
  Filled 2019-08-11: qty 100

## 2019-08-11 MED ORDER — METHOCARBAMOL 500 MG PO TABS
500.0000 mg | ORAL_TABLET | Freq: Four times a day (QID) | ORAL | Status: DC | PRN
  Administered 2019-08-11 – 2019-08-12 (×2): 500 mg via ORAL
  Filled 2019-08-11 (×2): qty 1

## 2019-08-11 MED ORDER — SODIUM CHLORIDE (PF) 0.9 % IJ SOLN
INTRAMUSCULAR | Status: AC
  Filled 2019-08-11: qty 50

## 2019-08-11 MED ORDER — FENTANYL CITRATE (PF) 100 MCG/2ML IJ SOLN
INTRAMUSCULAR | Status: AC
  Filled 2019-08-11: qty 2

## 2019-08-11 MED ORDER — EPHEDRINE 5 MG/ML INJ
INTRAVENOUS | Status: AC
  Filled 2019-08-11: qty 10

## 2019-08-11 MED ORDER — DEXAMETHASONE SODIUM PHOSPHATE 10 MG/ML IJ SOLN
INTRAMUSCULAR | Status: DC | PRN
  Administered 2019-08-11: 8 mg via INTRAVENOUS

## 2019-08-11 MED ORDER — MENTHOL 3 MG MT LOZG: 1.0000 | LOZENGE | OROMUCOSAL | Status: DC | PRN

## 2019-08-11 MED ORDER — PROPOFOL 10 MG/ML IV BOLUS
INTRAVENOUS | Status: DC | PRN
  Administered 2019-08-11: 40 mg via INTRAVENOUS

## 2019-08-11 MED ORDER — DIPHENHYDRAMINE HCL 12.5 MG/5ML PO ELIX: 12.5000 mg | ORAL_SOLUTION | ORAL | Status: DC | PRN

## 2019-08-11 MED ORDER — CHLORHEXIDINE GLUCONATE 4 % EX LIQD: 60.0000 mL | Freq: Once | CUTANEOUS | Status: DC

## 2019-08-11 MED ORDER — EPHEDRINE SULFATE-NACL 50-0.9 MG/10ML-% IV SOSY
PREFILLED_SYRINGE | INTRAVENOUS | Status: DC | PRN
  Administered 2019-08-11 (×2): 10 mg via INTRAVENOUS

## 2019-08-11 MED ORDER — METOCLOPRAMIDE HCL 5 MG/ML IJ SOLN: 5.0000 mg | Freq: Three times a day (TID) | INTRAMUSCULAR | Status: DC | PRN

## 2019-08-11 MED ORDER — ROPIVACAINE HCL 5 MG/ML IJ SOLN
INTRAMUSCULAR | Status: DC | PRN
  Administered 2019-08-11: 30 mL via PERINEURAL

## 2019-08-11 MED ORDER — ASPIRIN EC 81 MG PO TBEC: 81.0000 mg | DELAYED_RELEASE_TABLET | Freq: Two times a day (BID) | ORAL | 0 refills | Status: DC

## 2019-08-11 MED ORDER — METOCLOPRAMIDE HCL 5 MG PO TABS: 5.0000 mg | ORAL_TABLET | Freq: Three times a day (TID) | ORAL | Status: DC | PRN

## 2019-08-11 MED ORDER — OXYCODONE HCL 5 MG/5ML PO SOLN: 5.0000 mg | Freq: Once | ORAL | Status: DC | PRN

## 2019-08-11 MED ORDER — AMLODIPINE BESYLATE 10 MG PO TABS
10.0000 mg | ORAL_TABLET | Freq: Every day | ORAL | Status: DC
  Administered 2019-08-11 – 2019-08-12 (×2): 10 mg via ORAL
  Filled 2019-08-11: qty 1

## 2019-08-11 MED ORDER — ASPIRIN 81 MG PO CHEW
81.0000 mg | CHEWABLE_TABLET | Freq: Two times a day (BID) | ORAL | Status: DC
  Administered 2019-08-11 – 2019-08-12 (×2): 81 mg via ORAL
  Filled 2019-08-11 (×2): qty 1

## 2019-08-11 MED ORDER — KCL IN DEXTROSE-NACL 20-5-0.45 MEQ/L-%-% IV SOLN
INTRAVENOUS | Status: DC
  Administered 2019-08-11 – 2019-08-12 (×2): via INTRAVENOUS
  Filled 2019-08-11 (×3): qty 1000

## 2019-08-11 MED ORDER — TRANEXAMIC ACID 1000 MG/10ML IV SOLN
INTRAVENOUS | Status: DC | PRN
  Administered 2019-08-11: 2000 mg via TOPICAL

## 2019-08-11 MED ORDER — BUPIVACAINE IN DEXTROSE 0.75-8.25 % IT SOLN
INTRATHECAL | Status: DC | PRN
  Administered 2019-08-11: 1.6 mL via INTRATHECAL

## 2019-08-11 MED ORDER — BISACODYL 5 MG PO TBEC: 5.0000 mg | DELAYED_RELEASE_TABLET | Freq: Every day | ORAL | Status: DC | PRN

## 2019-08-11 MED ORDER — CEFAZOLIN SODIUM-DEXTROSE 2-4 GM/100ML-% IV SOLN
2.0000 g | INTRAVENOUS | Status: AC
  Administered 2019-08-11: 2 g via INTRAVENOUS
  Filled 2019-08-11: qty 100

## 2019-08-11 MED ORDER — ACETAMINOPHEN 500 MG PO TABS
1000.0000 mg | ORAL_TABLET | Freq: Once | ORAL | Status: AC
  Administered 2019-08-11: 1000 mg via ORAL
  Filled 2019-08-11: qty 2

## 2019-08-11 MED ORDER — METHOCARBAMOL 500 MG IVPB - SIMPLE MED
500.0000 mg | Freq: Four times a day (QID) | INTRAVENOUS | Status: DC | PRN
  Filled 2019-08-11: qty 50

## 2019-08-11 MED ORDER — TRANEXAMIC ACID-NACL 1000-0.7 MG/100ML-% IV SOLN
1000.0000 mg | INTRAVENOUS | Status: AC
  Administered 2019-08-11: 1000 mg via INTRAVENOUS
  Filled 2019-08-11: qty 100

## 2019-08-11 MED ORDER — OXYCODONE-ACETAMINOPHEN 5-325 MG PO TABS: 1.0000 | ORAL_TABLET | ORAL | 0 refills | Status: DC | PRN

## 2019-08-11 MED ORDER — ALUM & MAG HYDROXIDE-SIMETH 200-200-20 MG/5ML PO SUSP: 30.0000 mL | ORAL | Status: DC | PRN

## 2019-08-11 MED ORDER — FENTANYL CITRATE (PF) 100 MCG/2ML IJ SOLN: 25.0000 ug | INTRAMUSCULAR | Status: DC | PRN

## 2019-08-11 MED ORDER — SODIUM CHLORIDE 0.9 % IR SOLN
Status: DC | PRN
  Administered 2019-08-11: 1

## 2019-08-11 MED ORDER — ADULT MULTIVITAMIN W/MINERALS CH: 1.0000 | ORAL_TABLET | Freq: Every day | ORAL | Status: DC

## 2019-08-11 MED ORDER — GABAPENTIN 300 MG PO CAPS
300.0000 mg | ORAL_CAPSULE | Freq: Three times a day (TID) | ORAL | Status: DC
  Administered 2019-08-11 (×2): 300 mg via ORAL
  Filled 2019-08-11 (×2): qty 1

## 2019-08-11 MED ORDER — PROPOFOL 10 MG/ML IV BOLUS
INTRAVENOUS | Status: AC
  Filled 2019-08-11: qty 20

## 2019-08-11 MED ORDER — FENTANYL CITRATE (PF) 100 MCG/2ML IJ SOLN
INTRAMUSCULAR | Status: DC | PRN
  Administered 2019-08-11: 50 ug via INTRAVENOUS
  Administered 2019-08-11 (×2): 25 ug via INTRAVENOUS

## 2019-08-11 MED ORDER — PHENOL 1.4 % MT LIQD: 1.0000 | OROMUCOSAL | Status: DC | PRN

## 2019-08-11 MED ORDER — PANTOPRAZOLE SODIUM 40 MG PO TBEC
40.0000 mg | DELAYED_RELEASE_TABLET | Freq: Every day | ORAL | Status: DC
  Administered 2019-08-11: 18:00:00 40 mg via ORAL

## 2019-08-11 MED ORDER — FLEET ENEMA 7-19 GM/118ML RE ENEM: 1.0000 | ENEMA | Freq: Once | RECTAL | Status: DC | PRN

## 2019-08-11 MED ORDER — POLYETHYLENE GLYCOL 3350 17 G PO PACK: 17.0000 g | PACK | Freq: Every day | ORAL | Status: DC | PRN

## 2019-08-11 MED ORDER — ONDANSETRON HCL 4 MG/2ML IJ SOLN
INTRAMUSCULAR | Status: AC
  Filled 2019-08-11: qty 2

## 2019-08-11 MED ORDER — LACTATED RINGERS IV SOLN
INTRAVENOUS | Status: DC
  Administered 2019-08-11: 10:00:00 via INTRAVENOUS

## 2019-08-11 MED ORDER — BUPIVACAINE LIPOSOME 1.3 % IJ SUSP
INTRAMUSCULAR | Status: DC | PRN
  Administered 2019-08-11: 20 mL

## 2019-08-11 MED ORDER — DEXAMETHASONE SODIUM PHOSPHATE 10 MG/ML IJ SOLN
INTRAMUSCULAR | Status: AC
  Filled 2019-08-11: qty 1

## 2019-08-11 MED ORDER — SODIUM CHLORIDE (PF) 0.9 % IJ SOLN
INTRAMUSCULAR | Status: DC | PRN
  Administered 2019-08-11: 70 mL

## 2019-08-11 SURGICAL SUPPLY — 59 items
ATTUNE MED DOME PAT 41 KNEE (Knees) ×1 IMPLANT
ATTUNE MED DOME PAT 41MM KNEE (Knees) ×1 IMPLANT
ATTUNE PS FEM RT SZ 7 CEM KNEE (Femur) ×2 IMPLANT
ATTUNE PSRP INSR SZ7 5 KNEE (Insert) ×1 IMPLANT
ATTUNE PSRP INSR SZ7 5MM KNEE (Insert) ×1 IMPLANT
BAG DECANTER FOR FLEXI CONT (MISCELLANEOUS) ×3 IMPLANT
BAG SPEC THK2 15X12 ZIP CLS (MISCELLANEOUS) ×1
BAG ZIPLOCK 12X15 (MISCELLANEOUS) ×3 IMPLANT
BASE TIBIAL ROT PLAT SZ 8 KNEE (Knees) IMPLANT
BLADE SAG 18X100X1.27 (BLADE) ×5 IMPLANT
BLADE SAW SGTL 11.0X1.19X90.0M (BLADE) ×3 IMPLANT
BLADE SURG SZ10 CARB STEEL (BLADE) ×6 IMPLANT
BNDG CMPR MED 10X6 ELC LF (GAUZE/BANDAGES/DRESSINGS) ×1
BNDG ELASTIC 6X10 VLCR STRL LF (GAUZE/BANDAGES/DRESSINGS) ×3 IMPLANT
BOWL SMART MIX CTS (DISPOSABLE) ×3 IMPLANT
BSPLAT TIB 8 CMNT ROT PLAT STR (Knees) ×1 IMPLANT
CEMENT HV SMART SET (Cement) ×4 IMPLANT
CLOSURE WOUND 1/2 X4 (GAUZE/BANDAGES/DRESSINGS) ×1
COVER SURGICAL LIGHT HANDLE (MISCELLANEOUS) ×3 IMPLANT
COVER WAND RF STERILE (DRAPES) ×2 IMPLANT
CUFF TOURN SGL QUICK 34 (TOURNIQUET CUFF) ×3
CUFF TRNQT CYL 34X4.125X (TOURNIQUET CUFF) ×1 IMPLANT
DECANTER SPIKE VIAL GLASS SM (MISCELLANEOUS) ×9 IMPLANT
DRAPE U-SHAPE 47X51 STRL (DRAPES) ×3 IMPLANT
DRSG AQUACEL AG ADV 3.5X10 (GAUZE/BANDAGES/DRESSINGS) ×3 IMPLANT
DRSG AQUACEL AG ADV 3.5X14 (GAUZE/BANDAGES/DRESSINGS) ×2 IMPLANT
DURAPREP 26ML APPLICATOR (WOUND CARE) ×3 IMPLANT
ELECT REM PT RETURN 15FT ADLT (MISCELLANEOUS) ×3 IMPLANT
GLOVE BIO SURGEON STRL SZ7.5 (GLOVE) ×3 IMPLANT
GLOVE BIO SURGEON STRL SZ8.5 (GLOVE) ×3 IMPLANT
GLOVE BIOGEL PI IND STRL 8 (GLOVE) ×1 IMPLANT
GLOVE BIOGEL PI IND STRL 9 (GLOVE) ×1 IMPLANT
GLOVE BIOGEL PI INDICATOR 8 (GLOVE) ×2
GLOVE BIOGEL PI INDICATOR 9 (GLOVE) ×2
GOWN STRL REUS W/TWL XL LVL3 (GOWN DISPOSABLE) ×6 IMPLANT
HANDPIECE INTERPULSE COAX TIP (DISPOSABLE) ×3
HOOD PEEL AWAY FLYTE STAYCOOL (MISCELLANEOUS) ×9 IMPLANT
IMMOBILIZER KNEE 22 UNIV (SOFTGOODS) ×2 IMPLANT
KIT TURNOVER KIT A (KITS) IMPLANT
NDL HYPO 21X1.5 SAFETY (NEEDLE) ×2 IMPLANT
NEEDLE HYPO 21X1.5 SAFETY (NEEDLE) ×6 IMPLANT
NS IRRIG 1000ML POUR BTL (IV SOLUTION) ×3 IMPLANT
PACK ICE MAXI GEL EZY WRAP (MISCELLANEOUS) ×3 IMPLANT
PACK TOTAL KNEE CUSTOM (KITS) ×3 IMPLANT
PIN DRILL FIX HALF THREAD (BIT) ×2 IMPLANT
PIN STEINMAN FIXATION KNEE (PIN) ×2 IMPLANT
PROTECTOR NERVE ULNAR (MISCELLANEOUS) ×3 IMPLANT
SET HNDPC FAN SPRY TIP SCT (DISPOSABLE) ×1 IMPLANT
STRIP CLOSURE SKIN 1/2X4 (GAUZE/BANDAGES/DRESSINGS) ×1 IMPLANT
SUT VIC AB 1 CTX 36 (SUTURE) ×3
SUT VIC AB 1 CTX36XBRD ANBCTR (SUTURE) ×1 IMPLANT
SUT VIC AB 3-0 CT1 27 (SUTURE) ×9
SUT VIC AB 3-0 CT1 TAPERPNT 27 (SUTURE) ×3 IMPLANT
SYR CONTROL 10ML LL (SYRINGE) ×6 IMPLANT
TIBIAL BASE ROT PLAT SZ 8 KNEE (Knees) ×3 IMPLANT
TRAY FOLEY MTR SLVR 16FR STAT (SET/KITS/TRAYS/PACK) ×3 IMPLANT
WATER STERILE IRR 1000ML POUR (IV SOLUTION) ×6 IMPLANT
WRAP KNEE MAXI GEL POST OP (GAUZE/BANDAGES/DRESSINGS) ×2 IMPLANT
YANKAUER SUCT BULB TIP 10FT TU (MISCELLANEOUS) ×3 IMPLANT

## 2019-08-11 NOTE — Anesthesia Postprocedure Evaluation (Signed)
Anesthesia Post Note  Patient: David Keith  Procedure(s) Performed: RIGHT TOTAL KNEE ARTHROPLASTY (Right Knee)     Patient location during evaluation: PACU Anesthesia Type: Spinal Level of consciousness: oriented and awake and alert Pain management: pain level controlled Vital Signs Assessment: post-procedure vital signs reviewed and stable Respiratory status: spontaneous breathing, respiratory function stable and nonlabored ventilation Cardiovascular status: blood pressure returned to baseline and stable Postop Assessment: no headache, no backache, no apparent nausea or vomiting and spinal receding Anesthetic complications: no    Last Vitals:  Vitals:   08/11/19 1430 08/11/19 1452  BP: (!) 148/84 (!) 141/93  Pulse: (!) 54 (!) 59  Resp: 13 15  Temp: (!) 36 C (!) 36.3 C  SpO2: 100% 100%    Last Pain:  Vitals:   08/11/19 1500  TempSrc:   PainSc: 0-No pain                 Lidia Collum

## 2019-08-11 NOTE — Op Note (Signed)
PATIENT ID:      David CamaraVincent G Dendinger  MRN:     [id number] DOB/AGE:    65-15-1957 / 63 y.o.       OPERATIVE REPORT   DATE OF PROCEDURE:  08/11/2019      PREOPERATIVE DIAGNOSIS:   RIGHT KNEE OSTEOARTHRITIS AND VALGUS      Estimated body mass index is 22.34 kg/m as calculated from the following:   Height as of 08/07/19: 5\' 9"  (1.753 m).   Weight as of 08/07/19: 68.6 kg.                                                       POSTOPERATIVE DIAGNOSIS:  Same                                                                      PROCEDURE:  Procedure(s): RIGHT TOTAL KNEE ARTHROPLASTY Using DepuyAttune RP implants #7R Femur, #8Tibia, 5 mm Attune RP bearing, 41 Patella    SURGEON: Nestor LewandowskyFrank J Codie Krogh  ASSISTANT:   Tomi LikensEric K. [employer] PA-C   (Present and scrubbed throughout the case, critical for assistance with exposure, retraction, instrumentation, and closure.)        ANESTHESIA: Spinal, 20cc Exparel, 50cc 0.25% Marcaine EBL: 400 cc FLUID REPLACEMENT: 1600 cc crystaloid TOURNIQUET: DRAINS: None TRANEXAMIC ACID: 1gm IV, 2gm topical COMPLICATIONS:  None         INDICATIONS FOR PROCEDURE: The patient has  RIGHT KNEE OSTEOARTHRITIS  Var deformities, XR shows bone on bone arthritis, lateral subluxation of tibia. Patient has failed all conservative measures including anti-inflammatory medicines, narcotics, attempts at exercise and weight loss, cortisone injections and viscosupplementation.  Risks and benefits of surgery have been discussed, questions answered.   DESCRIPTION OF PROCEDURE: The patient identified by armband, received  IV antibiotics, in [facility] holding [facility]. Patient taken to the operating room, appropriate anesthetic monitors were attached, and Spinal anesthesia was  induced. IV Tranexamic acid was given.Tourniquet applied high to the operative thigh. Lateral post and foot positioner applied to the table, the lower extremity was then prepped and draped in usual sterile fashion  from the toes to the tourniquet. Time-out procedure was performed. The skin and subcutaneous tissue along the incision was injected with 20 cc of a mixture of Exparel and Marcaine solution, using a 20-gauge by 1-1/2 inch needle. We began the operation, with the knee flexed 130 degrees, by making the anterior midline incision starting at handbreadth above the patella going over the patella 1 cm medial to and 4 cm distal to the tibial tubercle. Small bleeders in the skin and the subcutaneous tissue identified and cauterized. Transverse retinaculum was incised and reflected medially and a medial parapatellar arthrotomy was accomplished. the patella was everted and theprepatellar fat pad resected. The superficial medial collateral ligament was then elevated from anterior to posterior along the proximal flare of the tibia and anterior half of the menisci resected. The knee was hyperflexed exposing bone on bone arthritis. Peripheral and notch osteophytes as well as the cruciate ligaments were then resected. We continued to work  our way around posteriorly along the proximal tibia, and externally rotated the tibia subluxing it out from underneath the femur. A McHale PCL retractor was placed through the notch and a lateral Hohmann retractor placed, and we then entered the proximal tibia in line with the Depuy starter drill in line with the axis of the tibia followed by an intramedullary guide rod and 0-degree posterior slope cutting guide. The tibial cutting guide, 4 degree posterior sloped, was pinned into place allowing resection of 5 mm of bone medially and 5 mm of bone laterally. Satisfied with the tibial resection, we then entered the distal femur 2 mm anterior to the PCL origin with the intramedullary guide rod and applied the distal femoral cutting guide set at 9 mm, with 5 degrees of valgus. This was pinned along the epicondylar axis. At this point, the distal femoral cut was accomplished without difficulty. We then  sized for a #7R femoral component and pinned the guide in 3 degrees of external rotation. The chamfer cutting guide was pinned into place. The anterior, posterior, and chamfer cuts were accomplished without difficulty followed by the Attune RP box cutting guide and the box cut. We also removed posterior osteophytes from the posterior femoral condyles. The posterior capsule was injected with Exparel solution. The knee was brought into full extension. We checked our extension gap and fit a 5 mm bearing. Distracting in extension with a lamina spreader,  bleeders in the posterior capsule, Posterior medial and posterior lateral gutter were cauterized.  The transexamic acid-soaked sponge was then placed in the gap of the knee in extension. The knee was flexed 30. The posterior patella cut was accomplished with the 9.5 mm Attune cutting guide, sized for a 79mm dome, and the fixation pegs drilled.The knee was then once again hyperflexed exposing the proximal tibia. We sized for a # 8 tibial base plate, applied the smokestack and the conical reamer followed by the the Delta fin keel punch. We then hammered into place the Attune RP trial femoral component, drilled the lugs, inserted a  5 mm trial bearing, trial patellar button, and took the knee through range of motion from 0-130 degrees. Medial and lateral ligamentous stability was checked. No thumb pressure was required for patellar Tracking. The tourniquet was not used. All trial components were removed, mating surfaces irrigated with pulse lavage, and dried with suction and sponges. 10 cc of the Exparel solution was applied to the cancellus bone of the patella distal femur and proximal tibia.  After waiting 30 seconds, the bony surfaces were again, dried with sponges. A double batch of DePuy HV cement was mixed and applied to all bony metallic mating surfaces except for the posterior condyles of the femur itself. In order, we hammered into place the tibial tray and  removed excess cement, the femoral component and removed excess cement. The final Attune RP bearing was inserted, and the knee brought to full extension with compression. The patellar button was clamped into place, and excess cement removed. The knee was held at 30 flexion with compression, while the cement cured. The wound was irrigated out with normal saline solution pulse lavage. The rest of the Exparel was injected into the parapatellar arthrotomy, subcutaneous tissues, and periosteal tissues. The parapatellar arthrotomy was closed with running #1 Vicryl suture. The subcutaneous tissue with 0 and 2-0 undyed Vicryl suture, and the skin with running 3-0 SQ vicryl. An Aquacil and Ace wrap were applied. The patient was taken to recovery room without difficulty.  [name] 08/11/2019, 7:06 AM

## 2019-08-11 NOTE — Transfer of Care (Signed)
Immediate Anesthesia Transfer of Care Note  Patient: David Keith  Procedure(s) Performed: RIGHT TOTAL KNEE ARTHROPLASTY (Right Knee)  Patient Location: PACU  Anesthesia Type:Spinal  Level of Consciousness: awake and confused  Airway & Oxygen Therapy: Patient Spontanous Breathing and Patient connected to face mask oxygen  Post-op Assessment: Report given to RN and Post -op Vital signs reviewed and stable  Post vital signs: Reviewed and stable  Last Vitals:  Vitals Value Taken Time  BP 137/97 08/11/19 1341  Temp    Pulse 81 08/11/19 1345  Resp 21 08/11/19 1345  SpO2 100 % 08/11/19 1345  Vitals shown include unvalidated device data.  Last Pain:  Vitals:   08/11/19 0945  TempSrc: Oral         Complications: No apparent anesthesia complications

## 2019-08-11 NOTE — Anesthesia Preprocedure Evaluation (Addendum)
Anesthesia Evaluation  Patient identified by MRN, date of birth, ID band Patient awake    Reviewed: Allergy & Precautions, H&P , NPO status , Patient's Chart, lab work & pertinent test results  Airway Mallampati: II  TM Distance: >3 FB Neck ROM: Full    Dental  (+) Poor Dentition, Missing   Pulmonary Current SmokerPatient did not abstain from smoking.,  Current smoker, 10 pack year history    Pulmonary exam normal        Cardiovascular hypertension, Pt. 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/p ACDF 2019 negative psych ROS   GI/Hepatic negative GI ROS, (+)     substance abuse  alcohol use and marijuana use, Hepatitis -, C  Endo/Other  negative endocrine ROS  Renal/GU negative Renal ROS  negative genitourinary   Musculoskeletal  (+) Arthritis , Osteoarthritis,  Chronic LBP with radiculopathy and stenosis   Abdominal   Peds  Hematology  (+) anemia ,   Anesthesia Other Findings No AC, no bleeding disorders, normal plts & INR  Reproductive/Obstetrics negative OB ROS                           Anesthesia Physical  Anesthesia Plan  ASA: III  Anesthesia Plan: Regional and Spinal   Post-op Pain Management:  Regional for Post-op pain   Induction:   PONV Risk Score and Plan: 0 and Midazolam, Propofol infusion, TIVA and Treatment may vary due to age or medical condition  Airway Management Planned: Natural Airway, Simple Face Mask and Nasal Cannula  Additional Equipment: None  Intra-op Plan:   Post-operative Plan:   Informed Consent: I have reviewed the patients History and Physical, chart, labs and discussed the procedure including the risks, benefits and alternatives for the proposed anesthesia with the patient or authorized representative who has indicated his/her understanding and  acceptance.       Plan Discussed with:   Anesthesia Plan Comments:       Anesthesia Quick Evaluation

## 2019-08-11 NOTE — Anesthesia Procedure Notes (Signed)

## 2019-08-11 NOTE — Plan of Care (Signed)
POC initiated 

## 2019-08-11 NOTE — Anesthesia Procedure Notes (Signed)
Procedure Name: MAC Date/Time: 08/11/2019 11:09 AM Performed by: Eben Burow, CRNA Pre-anesthesia Checklist: Patient identified, Emergency Drugs available, Suction available, Patient being monitored and Timeout performed Oxygen Delivery Method: Simple face mask Dental Injury: Teeth and Oropharynx as per pre-operative assessment

## 2019-08-11 NOTE — Progress Notes (Signed)
AssistedDr. Carolyn Witman with right, ultrasound guided, adductor canal block. Side rails up, monitors on throughout procedure. See vital signs in flow sheet. Tolerated Procedure well.  

## 2019-08-11 NOTE — Progress Notes (Signed)
Orthopedic Tech Progress Note Patient Details:  David Keith 1956/06/22 [id number] Pt was in pain and moving all over the bed. Ortho Devices Type of Ortho Device: Bone foam zero knee       Ladell Pier [facility] 08/11/2019, 1:44 PM

## 2019-08-11 NOTE — Interval H&P Note (Signed)
History and Physical Interval Note:  08/11/2019 9:38 AM  David Keith  has presented today for surgery, with the diagnosis of RIGHT KNEE OSTEOARTHRITIS AND VALGUS.  The various methods of treatment have been discussed with the patient and family. After consideration of risks, benefits and other options for treatment, the patient has consented to  Procedure(s): RIGHT TOTAL KNEE ARTHROPLASTY (Right) as a surgical intervention.  The patient's history has been reviewed, patient examined, no change in status, stable for surgery.  I have reviewed the patient's chart and labs.  Questions were answered to the patient's satisfaction.     [name]

## 2019-08-11 NOTE — Evaluation (Signed)
Physical Therapy Evaluation Patient Details Name: David Keith MRN: [id number] DOB: 1956-08-15 Today's Date: 08/11/2019   History of Present Illness  Patient is 63 y.o. male s/p Rt TKA with PMH significant for HTN, low back pain, cervical myelopthy s/p ACDF in 2019 with chronic bil LE numbness.    Clinical Impression  David Keith is a 63 y.o. male POD 0 s/p Rt TKA. Patient reports modified independence with single crutch for mobility at baseline. Patient is now limited by functional impairments (see PT problem list below) and requires min assist for transfers and gait with RW. Patient was able to ambulate ~75 feet with RW and min assist to steady and maintain safe proximity. Patient instructed in exercise to facilitate ROM and circulation. Patient will benefit from continued skilled PT interventions to address impairments and progress towards PLOF. Acute PT will follow to progress mobility and stair training in preparation for safe discharge home.    Follow Up Recommendations Follow surgeon's recommendation for DC plan and follow-up therapies    Equipment Recommendations  None recommended by PT    Recommendations for Other Services       Precautions / Restrictions Precautions Precautions: Fall Restrictions Weight Bearing Restrictions: No      Mobility  Bed Mobility Overal bed mobility: Needs Assistance Bed Mobility: Supine to Sit     Supine to sit: Supervision;HOB elevated     General bed mobility comments: pt using bed rails, no cues required  Transfers Overall transfer level: Needs assistance Equipment used: Rolling walker (2 wheeled) Transfers: Sit to/from Stand Sit to Stand: Min guard         General transfer comment: verbal cues required for safety as pt is eager and slightly impulsive to move, pt followed ceus safely when instructed. no assist required to initiate power up, pt slighlty unsteady with rise.  Ambulation/Gait Ambulation/Gait assistance:  Min assist Gait Distance (Feet): 75 Feet Assistive device: Rolling walker (2 wheeled) Gait Pattern/deviations: Step-through pattern;Decreased weight shift to right;Wide base of support Gait velocity: decreased   General Gait Details: ceus requried for safe hand placement on RW and safe step pattern within RW. Verbal cues and assist required to maintain safe proximity to [employer] Mobility    Modified Rankin (Stroke Patients Only)       Balance Overall balance assessment: Needs assistance Sitting-balance support: Feet supported;No upper extremity supported Sitting balance-Leahy Scale: Good     Standing balance support: During functional activity;Bilateral upper extremity supported Standing balance-Leahy Scale: Fair            Pertinent Vitals/Pain Pain Assessment: Faces Faces Pain Scale: Hurts a little bit Pain Location: Rt knee Pain Descriptors / Indicators: Aching;Sore Pain Intervention(s): Monitored during session;Limited activity within patient's tolerance;Repositioned;Ice applied    Home Living Family/patient expects to be discharged to:: Private residence Living Arrangements: Spouse/significant other Available Help at Discharge: Available 24 hours/day;Family Type of Home: Apartment Home Access: Stairs to enter Entrance Stairs-Rails: Right Entrance Stairs-Number of Steps: 4 steps wtih Rt rail and 3 more steps with Rt rail; or ~15 steps with Rt hand rail Home Layout: One level Home Equipment: Media planner - 2 wheels      Prior Function Level of Independence: Independent with assistive device(s)         Comments: pt uses single crutch to mobilize at baseline, he enjoys walking around community for exercise     Hand Dominance  Dominant Hand: Right    Extremity/Trunk Assessment   Upper Extremity Assessment Upper Extremity Assessment: Overall WFL for tasks assessed    Lower Extremity Assessment Lower  Extremity Assessment: RLE deficits/detail RLE Deficits / Details: pt with good quad strength, no extensor lag with SLR, 4/5 for strength with MMT RLE Sensation: history of peripheral neuropathy RLE Coordination: WNL    Cervical / Trunk Assessment Cervical / Trunk Assessment: Normal  Communication   Communication: No difficulties  Cognition Arousal/Alertness: Awake/alert Behavior During Therapy: WFL for tasks assessed/performed Overall Cognitive Status: Within Functional Limits for tasks assessed         General Comments      Exercises Total Joint Exercises Ankle Circles/Pumps: AROM;15 reps;Seated;Both Quad Sets: AROM;10 reps;Seated;Right Heel Slides: AROM;10 reps;Seated;Right   Assessment/Plan    PT Assessment Patient needs continued PT services  PT Problem List Decreased strength;Decreased balance;Decreased range of motion;Decreased mobility;Decreased activity tolerance;Decreased knowledge of use of DME       PT Treatment Interventions DME instruction;Functional mobility training;Therapeutic activities;Gait training;Stair training;Therapeutic exercise;Balance training;Patient/family education;Modalities    PT Goals (Current goals can be found in the Care Plan section)  Acute Rehab PT Goals Patient Stated Goal: to go home and get back to walking for exercise PT Goal Formulation: With patient Time For Goal Achievement: 08/18/19 Potential to Achieve Goals: Good    Frequency 7X/week    AM-PAC PT "6 Clicks" Mobility  Outcome Measure Help needed turning from your back to your side while in a flat bed without using bedrails?: A Little Help needed moving from lying on your back to sitting on the side of a flat bed without using bedrails?: A Little Help needed moving to and from a bed to a chair (including a wheelchair)?: A Little Help needed standing up from a chair using your arms (e.g., wheelchair or bedside chair)?: A Little Help needed to walk in hospital room?: A  Little Help needed climbing 3-5 steps with a railing? : A Little 6 Click Score: 18    End of Session Equipment Utilized During Treatment: Gait belt Activity Tolerance: Patient tolerated treatment well Patient left: in chair;with call bell/phone within reach;with chair alarm set Nurse Communication: Mobility status PT Visit Diagnosis: Muscle weakness (generalized) (M62.81);Difficulty in walking, not elsewhere classified (R26.2)    Time: 4627-0350 PT Time Calculation (min) (ACUTE ONLY): 29 min   Charges:   PT Evaluation $PT Eval Low Complexity: 1 Low PT Treatments $Therapeutic Exercise: 8-22 mins        Valentino Saxon, PT, DPT Physical Therapist with [facility]  08/11/2019 7:06 PM

## 2019-08-11 NOTE — Discharge Instructions (Signed)

## 2019-08-11 NOTE — Anesthesia Procedure Notes (Signed)
Anesthesia Regional Block: Adductor canal block   Pre-Anesthetic Checklist: ,, timeout performed, Correct Patient, Correct Site, Correct Laterality, Correct Procedure, Correct Position, site marked, Risks and benefits discussed,  Surgical consent,  Pre-op evaluation,  At surgeon's request and post-op pain management  Laterality: Right  Prep: chloraprep       Needles:  Injection technique: Single-shot  Needle Type: Echogenic Stimulator Needle     Needle Length: 10cm  Needle Gauge: 21     Additional Needles:   Procedures:,,,, ultrasound used (permanent image in chart),,,,  Narrative:  Start time: 08/11/2019 10:05 AM End time: 08/11/2019 10:11 AM Injection made incrementally with aspirations every 5 mL.  Performed by: Personally  Anesthesiologist: Lidia Collum, MD  Additional Notes: Monitors applied. Injection made in 5cc increments. No resistance to injection. Good needle visualization. Patient tolerated procedure well.

## 2019-08-12 ENCOUNTER — Encounter (HOSPITAL_COMMUNITY): Payer: Self-pay | Admitting: Orthopedic Surgery

## 2019-08-12 DIAGNOSIS — M1711 Unilateral primary osteoarthritis, right knee: Secondary | ICD-10-CM | POA: Diagnosis not present

## 2019-08-12 LAB — BASIC METABOLIC PANEL
Anion gap: 6 (ref 5–15)
BUN: 10 mg/dL (ref 8–23)
CO2: 24 mmol/L (ref 22–32)
Calcium: 8.4 mg/dL — ABNORMAL LOW (ref 8.9–10.3)
Chloride: 107 mmol/L (ref 98–111)
Creatinine, Ser: 0.83 mg/dL (ref 0.61–1.24)
GFR calc Af Amer: >60 mL/min (ref >60)
GFR calc non Af Amer: >60 mL/min (ref >60)
Glucose, Bld: 232 mg/dL — ABNORMAL HIGH (ref 70–99)
Potassium: 4.4 mmol/L (ref 3.5–5.1)
Sodium: 137 mmol/L (ref 135–145)

## 2019-08-12 LAB — CBC
HCT: 34.7 % — ABNORMAL LOW (ref 39.0–52.0)
Hemoglobin: 11.2 g/dL — ABNORMAL LOW (ref 13.0–17.0)
MCH: 30.5 pg (ref 26.0–34.0)
MCHC: 32.3 g/dL (ref 30.0–36.0)
MCV: 94.6 fL (ref 80.0–100.0)
Platelets: 187 10*3/uL (ref 150–400)
RBC: 3.67 MIL/uL — ABNORMAL LOW (ref 4.22–5.81)
RDW: 15.3 % (ref 11.5–15.5)
WBC: 9 10*3/uL (ref 4.0–10.5)
nRBC: 0 % (ref 0.0–0.2)

## 2019-08-12 NOTE — Discharge Summary (Signed)
Patient ID: David CamaraVincent G Keith MRN: [id number] DOB/AGE: Feb 22, 1956 63 y.o.  Admit date: 08/11/2019 Discharge date: 08/12/2019  Admission Diagnoses:  Principal Problem:   Osteoarthritis of right knee Active Problems:   S/P total knee replacement, right   Discharge Diagnoses:  Same  Past Medical History:  Diagnosis Date  . Cervical spondylosis with myelopathy and radiculopathy   . Chronic back pain    radiculopathy and stenosis  . Hepatitis    HEPATITIS C ,  [facility]   . Hypertension    takes AMlodipine daily  . Joint swelling    right knee  . Weakness    numbness in both legs when walking    Surgeries: Procedure(s): RIGHT TOTAL KNEE ARTHROPLASTY on 08/11/2019   Consultants:   Discharged Condition: Improved  Hospital Course: David Keith is an 63 y.o. male who was admitted 08/11/2019 for operative treatment ofOsteoarthritis of right knee. Patient has severe unremitting pain that affects sleep, daily activities, and work/hobbies. After pre-op clearance the patient was taken to the operating room on 08/11/2019 and underwent  Procedure(s): RIGHT TOTAL KNEE ARTHROPLASTY.    Patient was given perioperative antibiotics:  Anti-infectives (From admission, onward)   Start     Dose/Rate Route Frequency Ordered Stop   08/11/19 0915  ceFAZolin (ANCEF) IVPB 2g/100 mL premix     2 g 200 mL/hr over 30 Minutes Intravenous On call to O.R. 08/11/19 0905 08/11/19 1112       Patient was given sequential compression devices, early ambulation, and chemoprophylaxis to prevent DVT.  Patient benefited maximally from hospital stay and there were no complications.    Recent vital signs:  Patient Vitals for the past 24 hrs:  BP Temp Temp src Pulse Resp SpO2 Height Weight  08/12/19 0531 123/84 (Abnormal) 97.4 F (36.3 C) Oral 79 18 96 % no documentation no documentation  08/12/19 0150 116/75 (Abnormal) 97.5 F (36.4 C) Oral 68 18 97 % no documentation no documentation   08/11/19 2139 (Abnormal) 155/91 98.2 F (36.8 C) Oral 77 18 96 % no documentation no documentation  08/11/19 1752 (Abnormal) 150/94 97.8 F (36.6 C) no documentation 76 17 100 % no documentation no documentation  08/11/19 1654 (Abnormal) 146/90 (Abnormal) 96.9 F (36.1 C) no documentation 67 17 100 % no documentation no documentation  08/11/19 1551 (Abnormal) 141/92 97.8 F (36.6 C) no documentation 60 16 100 % no documentation no documentation  08/11/19 1527 no documentation no documentation no documentation no documentation no documentation no documentation 5\' 9"  (1.753 m) 68.6 kg  08/11/19 1452 (Abnormal) 141/93 (Abnormal) 97.4 F (36.3 C) Oral (Abnormal) 59 15 100 % no documentation no documentation  08/11/19 1430 (Abnormal) 148/84 (Abnormal) 96.8 F (36 C) no documentation (Abnormal) 54 13 100 % no documentation no documentation  08/11/19 1415 118/87 no documentation no documentation 63 13 100 % no documentation no documentation  08/11/19 1400 129/80 no documentation no documentation 63 14 100 % no documentation no documentation  08/11/19 1345 121/84 97.6 F (36.4 C) no documentation 81 (Abnormal) 21 100 % no documentation no documentation  08/11/19 1030 (Abnormal) 159/91 no documentation no documentation 91 19 100 % no documentation no documentation  08/11/19 1015 131/82 no documentation no documentation 80 16 100 % no documentation no documentation  08/11/19 1012 no documentation no documentation no documentation 85 19 100 % no documentation no documentation  08/11/19 1011 no documentation no documentation no documentation 86 17 100 % no documentation no documentation  08/11/19 1010 130/88 no documentation  no documentation 84 15 100 % no documentation no documentation  08/11/19 1009 no documentation no documentation no documentation 84 15 100 % no documentation no documentation  08/11/19 1008 no documentation no documentation no documentation 81 18 100 % no documentation no  documentation  08/11/19 1007 no documentation no documentation no documentation 91 13 100 % no documentation no documentation  08/11/19 1006 (Abnormal) 148/97 no documentation no documentation 87 11 98 % no documentation no documentation  08/11/19 1005 (Abnormal) 148/97 no documentation no documentation no documentation no documentation no documentation no documentation no documentation  08/11/19 0945 (Abnormal) 147/90 98.7 F (37.1 C) Oral 90 18 96 % no documentation no documentation     Recent laboratory studies:  Recent Labs    08/12/19 0258  WBC 9.0  HGB 11.2*  HCT 34.7*  PLT 187  NA 137  K 4.4  CL 107  CO2 24  BUN 10  CREATININE 0.83  GLUCOSE 232*  CALCIUM 8.4*     Discharge Medications:   Allergies as of 08/12/2019    Allergen Reactions Comment   Flexeril [cyclobenzaprine] Other (See Comments) Gives him weird thoughts   Other  NO BLOOD PRODUCTS. (pt will accept Albumin)      Medication List    Stop taking these medications   meloxicam 7.5 MG tablet Commonly known as: MOBIC   methocarbamol 500 MG tablet Commonly known as: Robaxin     Take these medications   amLODipine 10 MG tablet Commonly known as: NORVASC Take 1 tablet (10 mg total) by mouth daily.   aspirin EC 81 MG tablet Take 1 tablet (81 mg total) by mouth 2 (two) times daily. What changed: when to take this   diclofenac sodium 1 % Gel Commonly known as: VOLTAREN Apply 2 g topically 4 (four) times daily. What changed:   when to take this  reasons to take this   multivitamin with minerals Tabs tablet Take 1 tablet by mouth daily.   oxyCODONE-acetaminophen 5-325 MG tablet Commonly known as: PERCOCET/ROXICET Take 1 tablet by mouth every 4 (four) hours as needed for severe pain.   tiZANidine 2 MG tablet Commonly known as: ZANAFLEX Take 1 tablet (2 mg total) by mouth every 6 (six) hours as needed.        Durable Medical Equipment  (From admission, onward)         Start      Ordered   08/11/19 1501  DME Walker rolling  Once    Question:  Patient needs a walker to treat with the following condition  Answer:  Status post right knee replacement   08/11/19 1500   08/11/19 1501  DME 3 n 1  Once     08/11/19 1500           Discharge Care Instructions  (From admission, onward)         Start     Ordered   08/12/19 0000  Change dressing    Comments: Change dressing Only if drainage exceeds 40% of window on dressing   08/12/19 0804          Diagnostic Studies: Dg Chest 2 View  Result Date: 08/07/2019 CLINICAL DATA:  63 year old male under preoperative evaluation prior to knee surgery. EXAM: CHEST - 2 VIEW COMPARISON:  Chest x-ray 06/04/2018. FINDINGS: Lung volumes are normal. No consolidative airspace disease. No pleural effusions. No pneumothorax. No pulmonary nodule or mass noted. Pulmonary vasculature and the cardiomediastinal silhouette are within normal limits. IMPRESSION: No radiographic evidence  of acute cardiopulmonary disease. Electronically Signed   By: Trudie Reed M.D.   On: 08/07/2019 15:44    Disposition: Discharge disposition: 01-Home or Self Care       Discharge Instructions    Call MD / Call 911   Complete by: As directed    If you experience chest pain or shortness of breath, CALL 911 and be transported to the hospital emergency room.  If you develope a fever above 101 F, pus (white drainage) or increased drainage or redness at the wound, or calf pain, call your surgeon's office.   Change dressing   Complete by: As directed    Change dressing Only if drainage exceeds 40% of window on dressing   Constipation Prevention   Complete by: As directed    Drink plenty of fluids.  Prune juice may be helpful.  You may use a stool softener, such as Colace (over the counter) 100 mg twice a day.  Use MiraLax (over the counter) for constipation as needed.   Diet - low sodium heart healthy   Complete by: As directed    Increase activity  slowly as tolerated   Complete by: As directed       Follow-up Information    Gean Birchwood, MD In 2 weeks.   Specialty: Orthopedic Surgery Contact information: [address] [place] [address] [phone]            Signed: Nestor Lewandowsky 08/12/2019, 8:04 AM

## 2019-08-12 NOTE — Progress Notes (Signed)
Therapy Plan: Prearranged [facility] Has DME

## 2019-08-12 NOTE — Progress Notes (Signed)
PATIENT ID: David Keith  MRN: [id number]  DOB/AGE:  [birth year] / 63 y.o.  1 Day Post-Op Procedure(s) (LRB): RIGHT TOTAL KNEE ARTHROPLASTY (Right)    PROGRESS NOTE Subjective: Patient is alert, oriented, no Nausea, no Vomiting, yes passing gas. Taking PO well. Denies SOB, Chest or Calf Pain. Using Incentive Spirometer, PAS in place. Ambulate 75', Patient reports pain as 1/10 .    Objective: Vital signs in last 24 hours: Vitals:   08/11/19 1752 08/11/19 2139 08/12/19 0150 08/12/19 0531  BP: (Abnormal) 150/94 (Abnormal) 155/91 116/75 123/84  Pulse: 76 77 68 79  Resp: 17 18 18 18   Temp: 97.8 F (36.6 C) 98.2 F (36.8 C) (Abnormal) 97.5 F (36.4 C) (Abnormal) 97.4 F (36.3 C)  TempSrc:  Oral Oral Oral  SpO2: 100% 96% 97% 96%  Weight:      Height:          Intake/Output from previous day: I/O last 3 completed shifts: In: 5106.9 [P.O.:560; I.V.:3346.9; IV Piggyback:1200] Out: 3400 [Urine:3300; Blood:100]   Intake/Output this shift: No intake/output data recorded.   LABORATORY DATA: Recent Labs    08/12/19 0258  WBC 9.0  HGB 11.2*  HCT 34.7*  PLT 187  NA 137  K 4.4  CL 107  CO2 24  BUN 10  CREATININE 0.83  GLUCOSE 232*  CALCIUM 8.4*    Examination: Neurologically intact ABD soft Neurovascular intact Sensation intact distally Intact pulses distally Dorsiflexion/Plantar flexion intact Incision: dressing C/D/I No cellulitis present Compartment soft} Can do straight leg raise without difficulty flexes 90 actively.  Assessment:   1 Day Post-Op Procedure(s) (LRB): RIGHT TOTAL KNEE ARTHROPLASTY (Right) ADDITIONAL DIAGNOSIS: Expected Acute Blood Loss Anemia, Hypertension, history of alcohol abuse, history of tobacco abuse, History of hepatitis C Patient's anticipated LOS is less than 2 midnights, meeting these requirements: - Younger than 68 - Lives within 1 hour of care - Has a competent adult at home to recover with post-op recover - NO history of  -  Chronic pain requiring opiods  - Diabetes  - Coronary Artery Disease  - Heart failure  - Heart attack  - Stroke  - DVT/VTE  - Cardiac arrhythmia  - Respiratory Failure/COPD  - Renal failure  - Anemia  - Advanced Liver disease       Plan: PT/OT WBAT, AROM and PROM  DVT Prophylaxis:  SCDx72hrs, ASA 81 mg BID x 2 weeks DISCHARGE PLAN: Home,Today when he finishes physical therapy DISCHARGE NEEDS: HHPT, Walker and 3-in-1 comode seat     David Keith 08/12/2019, 7:58 AM Patient ID: David Keith, male   DOB: 1955/12/25, 63 y.o.   MRN: [id number]

## 2019-08-12 NOTE — Progress Notes (Signed)
Physical Therapy Treatment Patient Details Name: David Keith MRN: [id number] DOB: [birth year] Today's Date: 08/12/2019    History of Present Illness Patient is 63 y.o. male s/p Rt TKA with PMH significant for HTN, low back pain, cervical myelopthy s/p ACDF in 2019 with chronic bil LE numbness.    PT Comments    Progressing well with mobility. All education completed. Okay to d/c from PT standpoint.    Follow Up Recommendations  Follow surgeon's recommendation for DC plan and follow-up therapies     Equipment Recommendations  None recommended by PT    Recommendations for Other Services       Precautions / Restrictions Precautions Precautions: Fall Restrictions Weight Bearing Restrictions: No Other Position/Activity Restrictions: WBAT    Mobility  Bed Mobility Overal bed mobility: Needs Assistance Bed Mobility: Supine to Sit     Supine to sit: HOB elevated Supervision      Transfers Overall transfer level: Needs assistance Equipment used: Rolling walker (2 wheeled) Transfers: Sit to/from Stand Sit to Stand: Min guard         General transfer comment: Close guard for safety. VCs safety, hand placement.  Ambulation/Gait Ambulation/Gait assistance: Min guard Gait Distance (Feet): 125 Feet Assistive device: Rolling walker (2 wheeled) Gait Pattern/deviations: Step-through pattern;Trunk flexed;Decreased stride length     General Gait Details: Cues for safety, proper use of RW, distance from RW, posture. Close guard for safety.   Stairs Stairs: Yes Min guard Stair Management: Sideways;One rail Right;Step to pattern Number of Stairs: 5 General stair comments: Up and over portable stairs x 2. VCS safety, technique, sequence,   Wheelchair Mobility    Modified Rankin (Stroke Patients Only)       Balance Overall balance assessment: Needs assistance         Standing balance support: Bilateral upper extremity supported Standing balance-Leahy  Scale: Poor                              Cognition Arousal/Alertness: Awake/alert Behavior During Therapy: WFL for tasks assessed/performed Overall Cognitive Status: Within Functional Limits for tasks assessed                                        Exercises Total Joint Exercises Ankle Circles/Pumps: AROM;15 reps;Both;Supine Quad Sets: AROM;10 reps;Right;Supine Hip ABduction/ADduction: AROM;Right;10 reps;Supine Straight Leg Raises: AROM;Right;10 reps;Supine Long Arc Quad: AROM;Right;10 reps;Seated Knee Flexion: AROM;Right;10 reps;Seated Goniometric ROM: ~5-85 degrees    General Comments        Pertinent Vitals/Pain Pain Assessment: 0-10 Pain Score: 3  Pain Location: R knee Pain Descriptors / Indicators: Aching;Sore Pain Intervention(s): Monitored during session;Ice applied;Repositioned    Home Living                      Prior Function            PT Goals (current goals can now be found in the care plan section) Progress towards PT goals: Progressing toward goals    Frequency    7X/week      PT Plan Current plan remains appropriate    Co-evaluation              AM-PAC PT "6 Clicks" Mobility   Outcome Measure  Help needed turning from your back to your side while in a flat bed without using bedrails?: None  Help needed moving from lying on your back to sitting on the side of a flat bed without using bedrails?: None Help needed moving to and from a bed to a chair (including a wheelchair)?: A Little Help needed standing up from a chair using your arms (e.g., wheelchair or bedside chair)?: A Little Help needed to walk in hospital room?: A Little Help needed climbing 3-5 steps with a railing? : A Little 6 Click Score: 20    End of Session Equipment Utilized During Treatment: Gait belt Activity Tolerance: Patient tolerated treatment well Patient left: in chair;with call bell/phone within reach;with chair alarm set    PT Visit Diagnosis: Other abnormalities of gait and mobility (R26.89)     Time: 2353-6144 PT Time Calculation (min) (ACUTE ONLY): 20 min  Charges:  $Gait Training: 8-22 mins                        Rebeca Alert, PT Acute Rehabilitation Services Pager: [phone] Office: [phone]

## 2019-11-22 IMAGING — CT CT NECK W/ CM
4 of 5 series · 14 of 33 positions shown, 16 images · IV contrast (APPLIED)
Comparison: 02/05/2018 MRI cervical spine and CT angiogram of the
neck.

CLINICAL DATA: 61 y/o M; retropharyngeal/prevertebral fluid
collection.

EXAM:
CT NECK WITH CONTRAST
TECHNIQUE: Multidetector CT imaging of the neck was performed using the
standard protocol following the bolus administration of intravenous
contrast.
CONTRAST:  75mL OMNIPAQUE IOHEXOL 300 MG/ML  SOLN

[Series 3: neck 2.0 i31s 3 · axial · 0.44mm/px · z∈[-285,-129]mm · 4 of 131 slices shown, 5 images]
[im 27/131  soft-tissue]
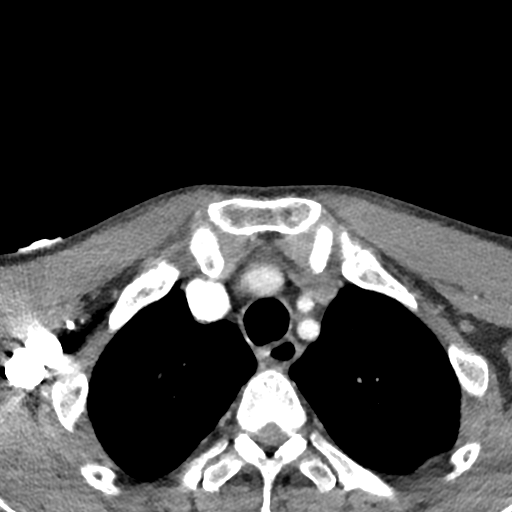
[im 27/131  bone]
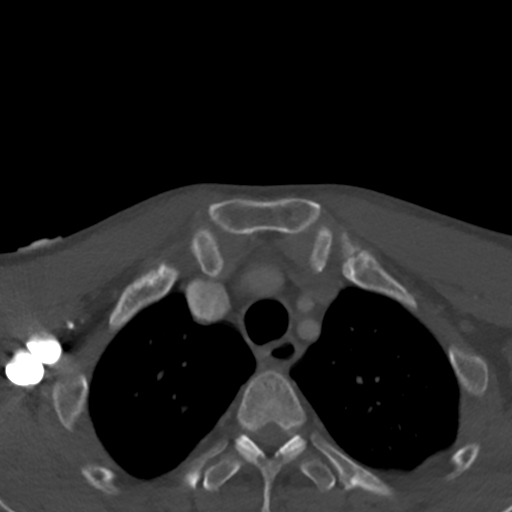
[im 53/131  bone]
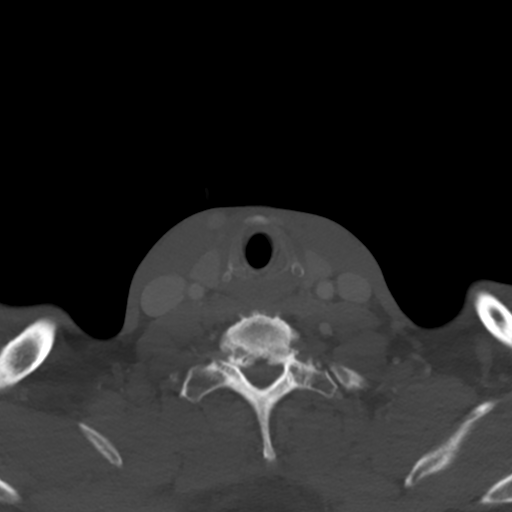
[im 79/131  bone]
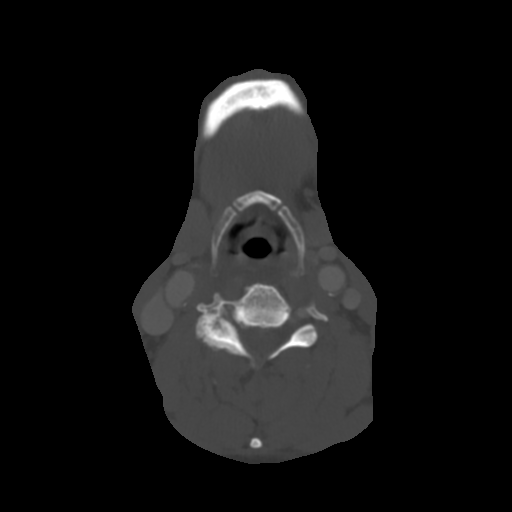
[im 105/131  bone]
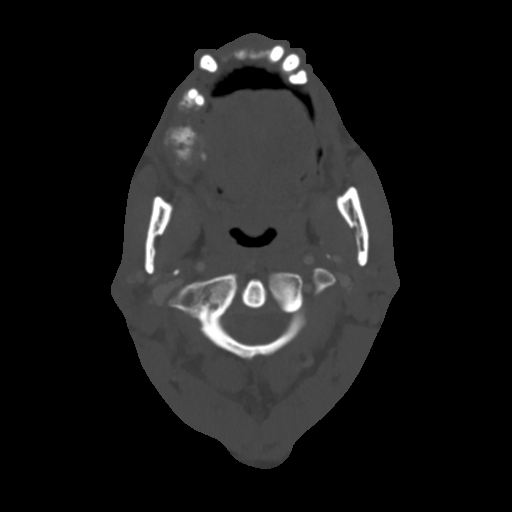

[Series 6: coronal st · coronal · 0.51mm/px · 3 of 120 slices shown]
[im 24/120  bone]
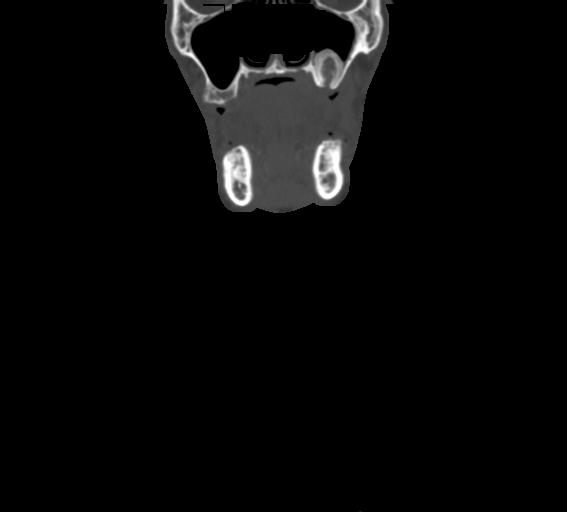
[im 48/120  bone]
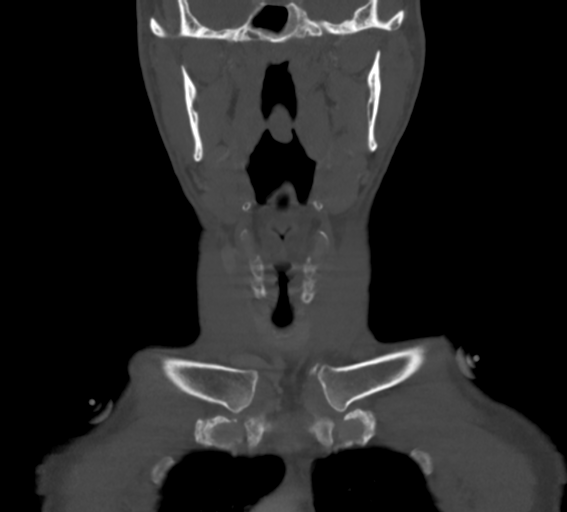
[im 72/120  bone]
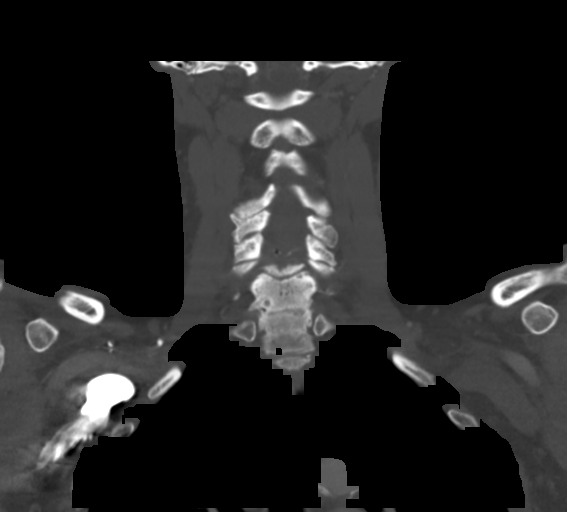

[Series 7: sagittal st · sagittal · 0.51mm/px · 5 of 101 slices shown, 6 images]
[im 34/101  bone]
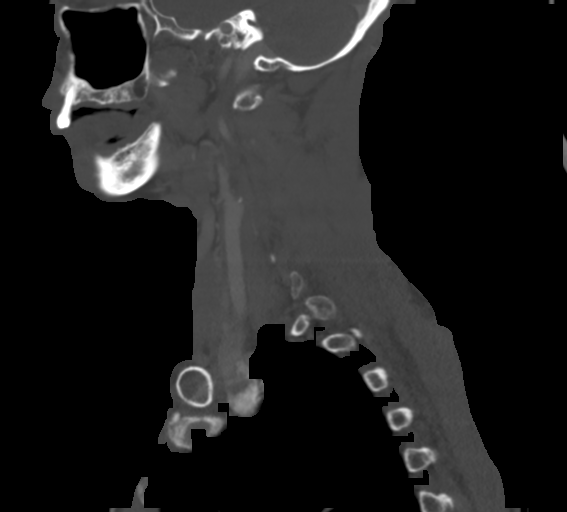
[im 42/101  bone]
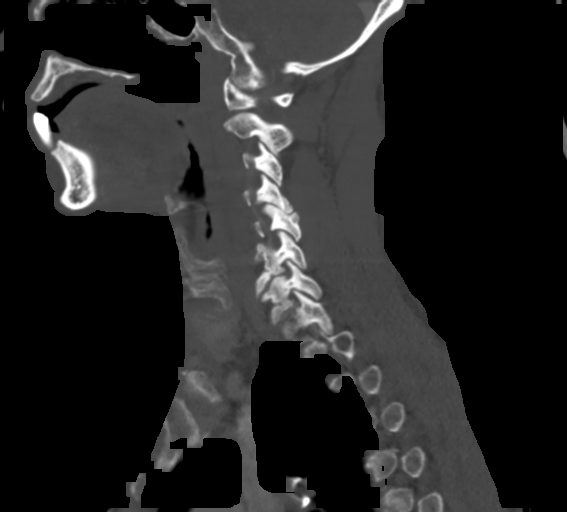
[im 51/101  soft-tissue]
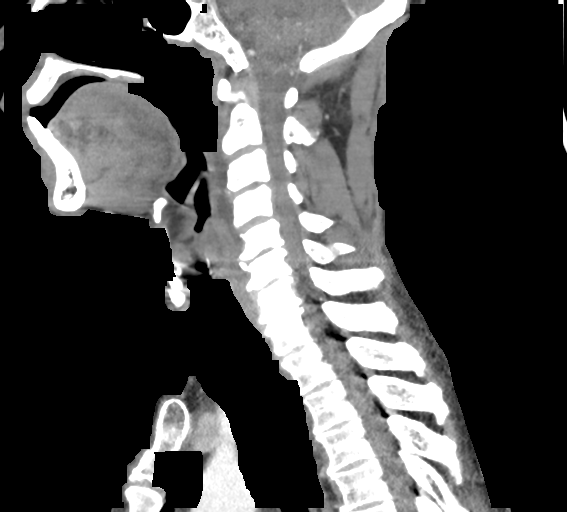
[im 51/101  bone]
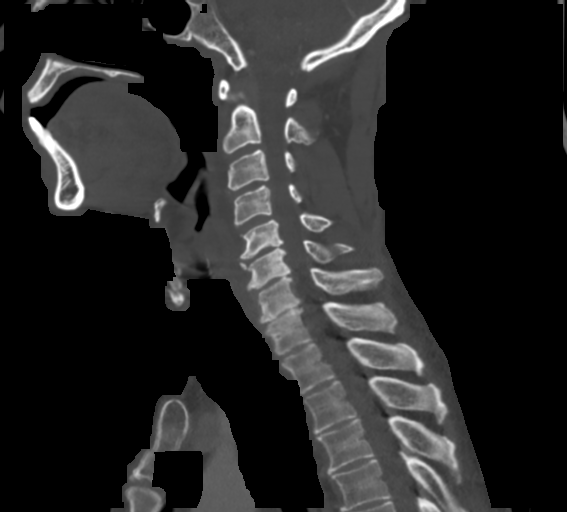
[im 59/101  bone]
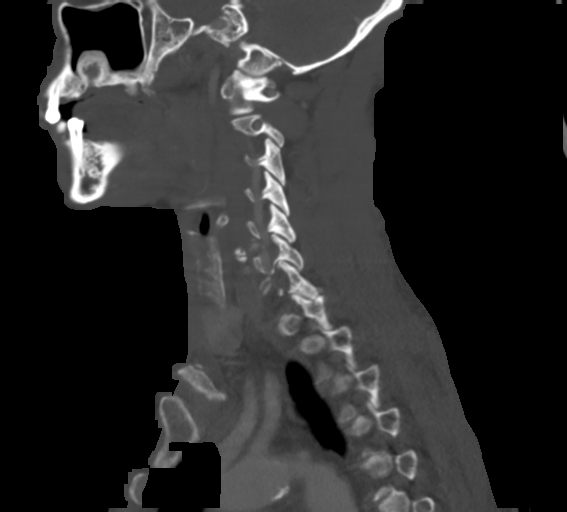
[im 67/101  bone]
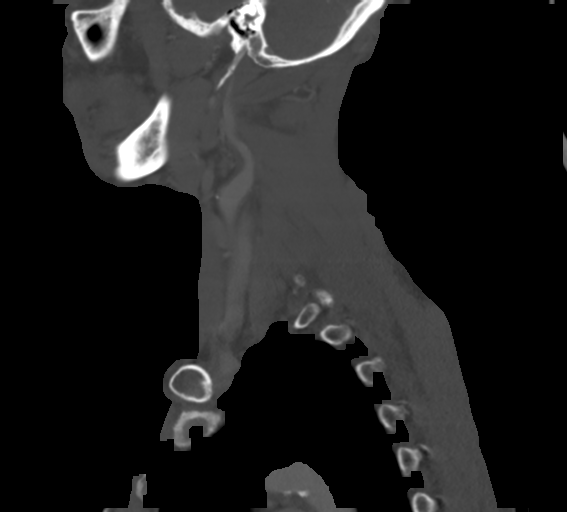

[Series 11: orthogonal st · axial · 0.39mm/px · z∈[-330,-273]mm · 2 of 152 slices shown]
[im 31/152  bone]
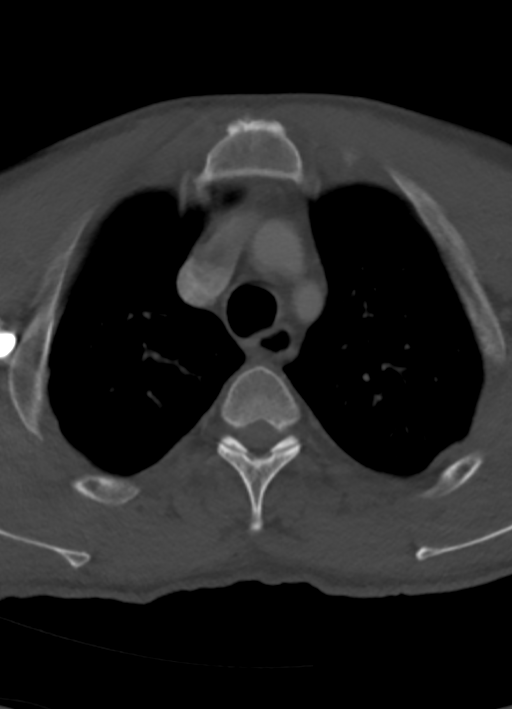
[im 61/152  bone]
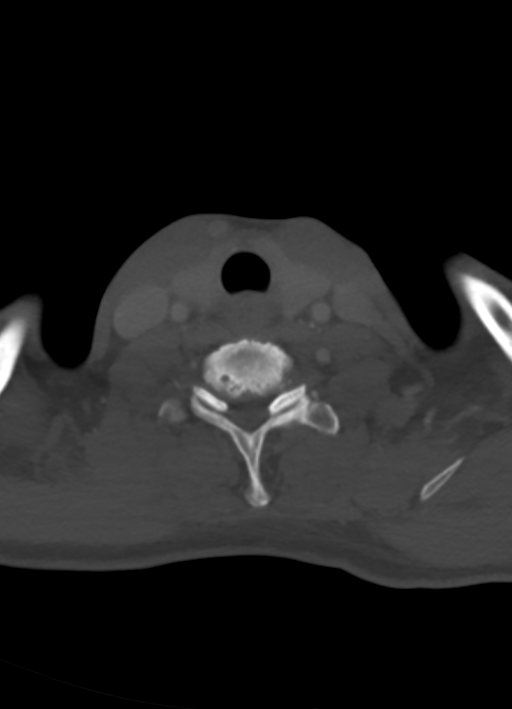

[14 of 33 positions shown; findings below may reference images not displayed]

FINDINGS: Pharynx and larynx: Normal. No mass or swelling.

Salivary glands: No inflammation, mass, or stone.

Thyroid: Normal.

Lymph nodes: None enlarged or abnormal density.

Vascular: Mild calcific atherosclerosis of the aortic arch and the
bilateral carotid bifurcations.

Limited intracranial: Negative.

Visualized orbits: Negative.

Mastoids and visualized paranasal sinuses: There is a dome-shaped
bony structure extending from the left maxillary alveolar bone in
the oral cavity into the left maxillary sinus antrum which may
represent a chronically calcified oral antral fistula or dental
cavity.

Skeleton: Multilevel degenerative changes of the cervical spine
greatest at the C3-4 level for a disc herniation results in canal
stenosis better characterized on the prior cervical MRI.

Prevertebral fluid collection extending from the C1 to the C5
vertebral body (series 7, image 52). There is no rim enhancement of
the prevertebral fluid collection and there is no appreciable
inflammatory change in the surrounding deep cervical compartments.

Upper chest: Negative.

Other: None.
IMPRESSION: 1. Again seen is a prevertebral/retropharyngeal fluid collection
extending from C1-C5. No peripheral enhancement of the fluid
collection and no inflammation of surrounding deep cervical
compartments is identified. The fluid collection is probably a
reactive effusion possibly related to adenoid tonsillitis, longus
coli tendinitis, or possibly sequelae of trauma as discussed on
prior studies. Abscess is unlikely.
2. Mild calcific atherosclerosis of the aorta and bilateral carotid
bifurcations.
3. Spondylosis of the cervical spine greatest at C3-4 where a disc
herniation results in canal stenosis.

By: Tashinga Gengan M.D.

## 2020-01-02 ENCOUNTER — Ambulatory Visit: Payer: Medicare Other | Attending: Internal Medicine

## 2020-01-02 DIAGNOSIS — Z23 Encounter for immunization: Secondary | ICD-10-CM

## 2020-01-02 NOTE — Progress Notes (Signed)
   Covid-19 Vaccination Clinic  Name:  ZAKARI BATHE    MRN: [id number] DOB: 04-Nov-1955  01/02/2020  Mr. Crumby was observed post Covid-19 immunization for 15 minutes without incident. He was provided with Vaccine Information Sheet and instruction to access the V-Safe system.   Mr. Dunklee was instructed to call 911 with any severe reactions post vaccine: Marland Kitchen Difficulty breathing  . Swelling of face and throat  . A fast heartbeat  . A bad rash all over body  . Dizziness and weakness   Immunizations Administered    Name Date Dose VIS Date Route   Pfizer COVID-19 Vaccine 01/02/2020 12:55 PM 0.3 mL 09/05/2019 Intramuscular   Manufacturer: [employer], [employer]   Lot: [id number]   NDC: [address]-7

## 2020-01-26 ENCOUNTER — Ambulatory Visit: Payer: Medicare Other | Attending: Internal Medicine

## 2020-01-26 DIAGNOSIS — Z23 Encounter for immunization: Secondary | ICD-10-CM

## 2020-01-26 NOTE — Progress Notes (Signed)
   Covid-19 Vaccination Clinic  Name:  TYJAI MATUSZAK    MRN: [id number] DOB: 1955-11-14  01/26/2020  Mr. Delmonaco was observed post Covid-19 immunization for 15 minutes without incident. He was provided with Vaccine Information Sheet and instruction to access the V-Safe system.   Mr. Mowatt was instructed to call 911 with any severe reactions post vaccine: Marland Kitchen Difficulty breathing  . Swelling of face and throat  . A fast heartbeat  . A bad rash all over body  . Dizziness and weakness   Immunizations Administered    Name Date Dose VIS Date Route   Pfizer COVID-19 Vaccine 01/26/2020 12:00 PM 0.3 mL 11/19/2018 Intramuscular   Manufacturer: [employer], [employer]   Lot: [id number]   NDC: [address]-9

## 2022-04-10 ENCOUNTER — Encounter: Payer: Self-pay | Admitting: Emergency Medicine

## 2022-04-10 ENCOUNTER — Ambulatory Visit
Admission: EM | Admit: 2022-04-10 | Discharge: 2022-04-10 | Disposition: A | Discharge status: Other Acute Inpatient Hospital | Payer: Medicare Other

## 2022-04-10 DIAGNOSIS — N50811 Right testicular pain: Secondary | ICD-10-CM

## 2022-04-10 DIAGNOSIS — S3994XA Unspecified injury of external genitals, initial encounter: Secondary | ICD-10-CM

## 2022-04-10 DIAGNOSIS — S3994XS Unspecified injury of external genitals, sequela: Secondary | ICD-10-CM

## 2022-04-10 DIAGNOSIS — R310 Gross hematuria: Secondary | ICD-10-CM

## 2022-04-10 DIAGNOSIS — N50819 Testicular pain, unspecified: Secondary | ICD-10-CM

## 2022-04-10 NOTE — ED Triage Notes (Addendum)
Pt here after a fall today and felt a poke from a chair in his right testicle.

## 2022-04-10 NOTE — ED Provider Notes (Signed)
Patient reports falling today and landing uncomfortably on his right testicle.  Patient states that after dinner this evening, when he went to the bathroom he he noticed a significant amount of blood in his urine as well as a small amount of blood in his underpants.  Patient states he came to urgent care today to get "checked out".  Patient was advised that he will need imaging of his testicles to rule out acute injury, particularly in light of bleeding.  Patient advised to the emergency now for acute evaluation.  Patient verbally agreed to do so.  Patient has significantly elevated blood pressure.  Patient states he did not take his amlodipine today.   Theadora Rama Scales, PA-C 04/10/22 1949

## 2022-08-29 ENCOUNTER — Other Ambulatory Visit: Payer: Self-pay

## 2022-08-29 ENCOUNTER — Ambulatory Visit (INDEPENDENT_AMBULATORY_CARE_PROVIDER_SITE_OTHER): Payer: Medicare Other | Admitting: Student

## 2022-08-29 ENCOUNTER — Encounter: Payer: Self-pay | Admitting: Student

## 2022-08-29 VITALS — BP 181/109 | HR 87 | Temp 98.5°F | Ht 69.0 in | Wt 150.7 lb

## 2022-08-29 DIAGNOSIS — F17210 Nicotine dependence, cigarettes, uncomplicated: Secondary | ICD-10-CM

## 2022-08-29 DIAGNOSIS — I10 Essential (primary) hypertension: Secondary | ICD-10-CM

## 2022-08-29 DIAGNOSIS — F17200 Nicotine dependence, unspecified, uncomplicated: Secondary | ICD-10-CM

## 2022-08-29 DIAGNOSIS — F1010 Alcohol abuse, uncomplicated: Secondary | ICD-10-CM

## 2022-08-29 MED ORDER — LOSARTAN POTASSIUM-HCTZ 50-12.5 MG PO TABS
1.0000 | ORAL_TABLET | Freq: Every day | ORAL | 3 refills | Status: DC
  Filled 2022-08-29 (×2): qty 90, 90d supply, fill #0

## 2022-08-29 NOTE — Assessment & Plan Note (Addendum)
Pt has been drinking 1/5 of Brandy every 2-3 weeks and a 12 pack of beer for over ten years. He states he does not think it is a problem, and has not led to any family problems or troubles with the law. Counseled patients on adverse effects of long term alcohol use, and he does endorse that he wants to slow down.   Plan:  - At next visit, will need update on how he has been slowing

## 2022-08-29 NOTE — Patient Instructions (Signed)
Thank you so much for coming to the clinic today! Today, we discussed your blood pressure. I want you to keep a log of you blood pressure daily and bring it in for the next visit. I am also sending in a medication that will help reduce your blood pressure. We're also getting some blood work, and once the results come back I'll call you. Please try to reduce the amount of alcohol that you drink weekly, and try getting the nicotine patches. If those don't work at your next visit we can try to consider Chantix.  Will see you back in a month!  If you have any questions please feel free to the call the clinic at anytime at [phone]. It was a pleasure seeing you!  Best, Dr. Thomasene Ripple

## 2022-08-29 NOTE — Assessment & Plan Note (Signed)
Pt's blood pressure upon entering was 190/102, recheck was 181/109. He has been out of his amlodipine for the last three months, and has not received a refill from previous provider because he has not been seen. He checks his blood pressure at home about once every two weeks, and he says last systolic was in the 150-160 range. His previous regimen was amlodipine 10mg . He denies any chest pain, dizziness, blurry vision, and shortness of breath.   Plan:  - Encouraged patient to check his blood pressure daily and return to clinic with log  - Started on Hyzaar 50-12.5mg  - Will need follow up BMP at next visit due to new medication initiation

## 2022-08-30 ENCOUNTER — Other Ambulatory Visit: Payer: Self-pay

## 2022-08-30 ENCOUNTER — Encounter: Payer: Self-pay | Admitting: Student

## 2022-08-30 LAB — CMP14 + ANION GAP
ALT: 92 IU/L — ABNORMAL HIGH (ref 0–44)
AST: 155 IU/L — ABNORMAL HIGH (ref 0–40)
Albumin/Globulin Ratio: 1.7 (ref 1.2–2.2)
Albumin: 4.6 g/dL (ref 3.9–4.9)
Alkaline Phosphatase: 90 IU/L (ref 44–121)
Anion Gap: 20 mmol/L — ABNORMAL HIGH (ref 10.0–18.0)
BUN/Creatinine Ratio: 8 — ABNORMAL LOW (ref 10–24)
BUN: 6 mg/dL — ABNORMAL LOW (ref 8–27)
Bilirubin Total: 0.5 mg/dL (ref 0.0–1.2)
CO2: 23 mmol/L (ref 20–29)
Calcium: 9.6 mg/dL (ref 8.6–10.2)
Chloride: 99 mmol/L (ref 96–106)
Creatinine, Ser: 0.75 mg/dL — ABNORMAL LOW (ref 0.76–1.27)
Globulin, Total: 2.7 g/dL (ref 1.5–4.5)
Glucose: 112 mg/dL — ABNORMAL HIGH (ref 70–99)
Potassium: 4.4 mmol/L (ref 3.5–5.2)
Sodium: 142 mmol/L (ref 134–144)
Total Protein: 7.3 g/dL (ref 6.0–8.5)
eGFR: 98 mL/min/{1.73_m2} (ref >59)

## 2022-08-30 NOTE — Progress Notes (Signed)
CC: Hypertension/New Patient Encounter  HPI:  [name] is a 66 y.o. male living with a history stated below and presents today for hypertension and new patient encounter. Please see problem based assessment and plan for additional details.  Past Medical History:  Diagnosis Date   Cervical spondylosis with myelopathy and radiculopathy    Chronic back pain    radiculopathy and stenosis   Hepatitis    HEPATITIS C ,  [facility]    Hypertension    takes AMlodipine daily   Joint swelling    right knee   Weakness    numbness in both legs when walking    Current Outpatient Medications on File Prior to Visit  Medication Sig Dispense Refill   amLODipine (NORVASC) 10 MG tablet Take 1 tablet (10 mg total) by mouth daily. 90 tablet 1   aspirin EC 81 MG tablet Take 1 tablet (81 mg total) by mouth 2 (two) times daily. 60 tablet 0   diclofenac sodium (VOLTAREN) 1 % GEL Apply 2 g topically 4 (four) times daily. (Patient taking differently: Apply 2 g topically 4 (four) times daily as needed (pain.). ) 150 g 2   Multiple Vitamin (MULTIVITAMIN WITH MINERALS) TABS tablet Take 1 tablet by mouth daily.     oxyCODONE-acetaminophen (PERCOCET/ROXICET) 5-325 MG tablet Take 1 tablet by mouth every 4 (four) hours as needed for severe pain. 30 tablet 0   tiZANidine (ZANAFLEX) 2 MG tablet Take 1 tablet (2 mg total) by mouth every 6 (six) hours as needed. 60 tablet 0   No current facility-administered medications on file prior to visit.    Family History  Problem Relation Age of Onset   Cancer Mother    Heart disease Father    Diabetes Brother     Social History   Socioeconomic History   Marital status: Married    Spouse name: Not on file   Number of children: Not on file   Years of education: Not on file   Highest education level: Not on file  Occupational History   Not on file  Tobacco Use   Smoking status: Every Day    Packs/day: 0.50    Years: 20.00    Total pack  years: 10.00    Types: Cigarettes   Smokeless tobacco: Never   Tobacco comments:    2-3 packs a week  Vaping Use   Vaping Use: Never used  Substance and Sexual Activity   Alcohol use: Yes    Alcohol/week: 15.0 standard drinks of alcohol    Types: 12 Cans of beer, 2 Shots of liquor, 1 Standard drinks or equivalent per week    Comment: Beer and liquor.   Drug use: Not Currently    Types: Marijuana    Comment: Sometimes.   Sexual activity: Not on file  Other Topics Concern   Not on file  Social History Narrative   Not on file   Social Determinants of Health   Financial Resource Strain: Low Risk  (08/29/2022)   Overall Financial Resource Strain (CARDIA)    Difficulty of Paying Living Expenses: Not hard at all  Food Insecurity: No Food Insecurity (08/29/2022)   Hunger Vital Sign    Worried About Running Out of Food in the Last Year: Never true    Ran Out of Food in the Last Year: Never true  Transportation Needs: No Transportation Needs (08/29/2022)   PRAPARE - Administrator, Civil Service (Medical): No  Lack of Transportation (Non-Medical): No  Physical Activity: Not on file  Stress: Not on file  Social Connections: Unknown (08/29/2022)   Social Connection and Isolation Panel [NHANES]    Frequency of Communication with Friends and Family: More than three times a week    Frequency of Social Gatherings with Friends and Family: More than three times a week    Attends Religious Services: Patient refused    Active Member of Clubs or Organizations: No    Attends Engineer, structural: Not on file    Marital Status: Married  Catering manager Violence: Not At Risk (08/29/2022)   Humiliation, Afraid, Rape, and Kick questionnaire    Fear of Current or Ex-Partner: No    Emotionally Abused: No    Physically Abused: No    Sexually Abused: No    Review of Systems: ROS negative except for what is noted on the assessment and plan.  Vitals:   08/29/22 1018 08/29/22  1046  BP: (!) 190/102 (!) 181/109  Pulse: 78 87  Temp: 98.5 F (36.9 C)   TempSrc: Oral   SpO2: 100%   Weight: 150 lb 11.2 oz (68.4 kg)   Height: 5\' 9"  (1.753 m)     Physical Exam: Constitutional: well-appearing male,  in no acute distress HENT: normocephalic atraumatic, mucous membranes moist Eyes: conjunctiva non-erythematous Neck: supple Cardiovascular: regular rate and rhythm, no m/r/g Pulmonary/Chest: normal work of breathing on room air, lungs clear to auscultation bilaterally Abdominal: soft, non-tender, non-distended MSK: normal bulk and tone Neurological: alert & oriented x 3, 5/5 strength in bilateral upper and lower extremities, normal gait Skin: warm and dry Psych: normal mood and affect  Assessment & Plan:   Hypertension Pt's blood pressure upon entering was 190/102, recheck was 181/109. He has been out of his amlodipine for the last three months, and has not received a refill from previous provider because he has not been seen. He checks his blood pressure at home about once every two weeks, and he says last systolic was in the 150-160 range. His previous regimen was amlodipine 10mg . He denies any chest pain, dizziness, blurry vision, and shortness of breath.   Plan:  - Encouraged patient to check his blood pressure daily and return to clinic with log  - Started on Hyzaar 50-12.5mg  - Will need follow up BMP at next visit due to new medication initiation  ETOH abuse Pt has been drinking 1/5 of Brandy every 2-3 weeks and a 12 pack of beer for over ten years. He states he does not think it is a problem, and has not led to any family problems or troubles with the law. Counseled patients on adverse effects of long term alcohol use, and he does endorse that he wants to slow down.   Plan:  - At next visit, will need update on how he has been slowing  Smoking Pt has been smoking 1 pack of cigarettes a day for over 20 years. He denies any current chest pain, shortness of  breath at rest or at exertion. He understands the adverse effects of smoking, especially when it comes to his blood pressure and would like to stop. He does not want to take another pill like Chantix, and would like to start with nicotine patches.   Plan:  - Next visit, will need update on cigarette use.   Patient seen with Dr. , M.D. [facility] Internal Medicine, PGY-1 Date 08/30/2022 Time 7:44 AM

## 2022-08-30 NOTE — Assessment & Plan Note (Signed)
Pt has been smoking 1 pack of cigarettes a day for over 20 years. He denies any current chest pain, shortness of breath at rest or at exertion. He understands the adverse effects of smoking, especially when it comes to his blood pressure and would like to stop. He does not want to take another pill like Chantix, and would like to start with nicotine patches.   Plan:  - Next visit, will need update on cigarette use.

## 2022-09-04 NOTE — Addendum Note (Signed)
Addended by: Burnell Blanks on: 09/04/2022 01:54 PM   Modules accepted: Level of Service

## 2022-09-04 NOTE — Progress Notes (Signed)
Internal Medicine Clinic Attending  I saw and evaluated the patient.  I personally confirmed the key portions of the history and exam documented by Dr. Nooruddin and I reviewed pertinent patient test results.  The assessment, diagnosis, and plan were formulated together and I agree with the documentation in the resident's note.  

## 2022-09-05 ENCOUNTER — Other Ambulatory Visit: Payer: Self-pay

## 2022-09-29 ENCOUNTER — Encounter: Payer: Medicare Other | Admitting: Internal Medicine

## 2022-10-16 ENCOUNTER — Encounter: Payer: Medicare Other | Admitting: Student

## 2022-10-16 ENCOUNTER — Ambulatory Visit: Payer: Medicare Other

## 2022-10-16 NOTE — Progress Notes (Deleted)
HTN BP elevated to SBP in the 180s to 190s during last office visit in the setting of patient being off his amlodipine 10 mg for 3 months. Hyzaar 50-12.5 mg daily was initiated with plan to follow-up for reevaluation.  Today,   Plan: -Check BMP   Tobacco use disorder Has more than 20-pack-year history and continues to smoke. -Meets criteria for lung cancer screening -Follow-up on tobacco cessation   Alcohol use disorder -Follow-up on alcohol cessation  Due for colonoscopy and pneumonia vaccine

## 2022-10-19 ENCOUNTER — Encounter: Payer: Self-pay | Admitting: Student

## 2022-10-19 ENCOUNTER — Ambulatory Visit (INDEPENDENT_AMBULATORY_CARE_PROVIDER_SITE_OTHER): Payer: Medicare Other | Admitting: Student

## 2022-10-19 ENCOUNTER — Ambulatory Visit (INDEPENDENT_AMBULATORY_CARE_PROVIDER_SITE_OTHER): Payer: Medicare Other

## 2022-10-19 VITALS — BP 119/94 | HR 103 | Temp 98.3°F | Ht 69.0 in | Wt 150.3 lb

## 2022-10-19 VITALS — BP 157/89 | HR 111 | Temp 98.3°F | Ht 69.0 in | Wt 150.3 lb

## 2022-10-19 DIAGNOSIS — F17210 Nicotine dependence, cigarettes, uncomplicated: Secondary | ICD-10-CM

## 2022-10-19 DIAGNOSIS — I10 Essential (primary) hypertension: Secondary | ICD-10-CM | POA: Diagnosis not present

## 2022-10-19 DIAGNOSIS — Z716 Tobacco abuse counseling: Secondary | ICD-10-CM | POA: Diagnosis not present

## 2022-10-19 DIAGNOSIS — F17200 Nicotine dependence, unspecified, uncomplicated: Secondary | ICD-10-CM

## 2022-10-19 DIAGNOSIS — W19XXXD Unspecified fall, subsequent encounter: Secondary | ICD-10-CM | POA: Diagnosis not present

## 2022-10-19 DIAGNOSIS — Z23 Encounter for immunization: Secondary | ICD-10-CM

## 2022-10-19 DIAGNOSIS — Z0000 Encounter for general adult medical examination without abnormal findings: Secondary | ICD-10-CM | POA: Diagnosis not present

## 2022-10-19 DIAGNOSIS — W19XXXA Unspecified fall, initial encounter: Secondary | ICD-10-CM

## 2022-10-19 MED ORDER — NICOTINE 21 MG/24HR TD PT24: 21.0000 mg | MEDICATED_PATCH | TRANSDERMAL | 1 refills | Status: DC

## 2022-10-19 NOTE — Patient Instructions (Signed)
Thank you, Mr.Athanasius BYRL LATIN for allowing [place] to provide your care today. Today we discussed your blood pressure, recent fall, tobacco use, and alcohol use disorder.  Make sure to follow-up with your neurosurgeon as discussed.  Check your blood pressure daily and follow-up with [place] in 4 weeks.  I have ordered the following labs for you:   Lab Orders         BMP8+Anion Gap      I will call if any are abnormal. All of your labs can be accessed through "My Chart".  I have ordered the following tests: X-ray of the shoulder, x-ray cervical spine  I have ordered the following medication/changed the following medications:  Start nicotine patch, 1 patch every 24 hours.  My Chart Access: [email or web address]  Please follow-up in 1 month  Please make sure to arrive 15 minutes prior to your next appointment. If you arrive late, you may be asked to reschedule.    We look forward to seeing you next time. Please call our clinic at [phone] if you have any questions or concerns. The best time to call is Monday-Friday from 9am-4pm, but there is someone available 24/7. If after hours or the weekend, call the main hospital number and ask for the Internal Medicine Resident On-Call. If you need medication refills, please notify your pharmacy one week in advance and they will send [place] a request.   Thank you for letting us take part in your care. Wishing you the best!  Lacinda Axon, MD 10/19/2022, 2:15 PM IM Resident, PGY-3 [facility] Milian 41:10

## 2022-10-19 NOTE — Progress Notes (Signed)
CC: BP follow up/fall  HPI:  David Keith is a 67 y.o. male with PMH as listed below who presents to clinic to follow up on his blood pressure. Please see problem based charting for evaluation, assessment and plan.  Past Medical History:  Diagnosis Date   Cervical spondylosis with myelopathy and radiculopathy    Chronic back pain    radiculopathy and stenosis   Hepatitis    HEPATITIS C ,  [facility]    Hypertension    takes AMlodipine daily   Joint swelling    right knee   Weakness    numbness in both legs when walking    Review of Systems:  Constitutional: Positive for fall. Negative for fatigue Eyes: Negative for visual changes  MSK: Positive for right shoulder pain and neck pain. Abdomen: Negative for abdominal pain, constipation or diarrhea Neuro: Negative for headache, numbness or weakness  Physical Exam: General: Pleasant, well-appearing middle-age man.  No acute distress. Cardiac: RRR. No murmurs, rubs or gallops. No LE edema Respiratory: Lungs CTAB. No wheezing or crackles. Skin: Warm, dry and intact without rashes or lesions MSK: Mild tenderness to palpation of the right shoulder and neck. Normal ROM of the shoulder and neck.  Negative Spurling, Neer, Kennedy-Hawkins and empty can tests. Neuro: A&O x 3. Moves all extremities. Normal sensation to gross touch. Psych: Appropriate mood and affect.  Vitals:   10/19/22 1320 10/19/22 1400  BP: (!) 157/89 (!) 119/94  Pulse: (!) 111 (!) 103  Temp: 98.3 F (36.8 C)   TempSrc: Oral   SpO2: 96%   Weight: 150 lb 4.8 oz (68.2 kg)   Height: 5\' 9"  (1.753 m)     Assessment & Plan:   Hypertension Patient found to have elevated BP with SBP in the 180s-190s during last office visit about 6 weeks ago in the setting of being out of his amlodipine. Patient was started on Hyzaar 50-12.5 mg daily with plan to follow-up in 1 month. Patient has BP today initially elevated with SBP in the 150s but repeat down to  110s. His diastolic BP is elevated at 94. Patient's BP improved but still not at goal.  Patient currently with some pain from his recent fall so we will follow-up in 1 month to reassess BP.  Patient counseled on importance of smoking cessation on BP control.  BMP today shows normal kidney function and no electrolyte abnormalities. Vitals:   10/19/22 1320 10/19/22 1400  BP: (!) 157/89 (!) 119/94   Plan: -Continue Hyzaar 50-12.5 mg daily -Check BP at home, bring log to next office visit -1 month follow-up for BP check  Fall Patient reports that over the weekend, after watching football, he tripped and fell on a water bottle. He denies hitting his head but reports falling on his right shoulder and neck. He started having shoulder and neck pain the next day described as throbbing and shooting down his right arm but denies any weakness, numbness, headaches or blurry vision. The pain has improved slightly but it remains intermittent. 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 On exam, patient does have some mild tenderness to palpation of the right shoulder and neck but has normal range of motion of the shoulder and the neck. Symptoms likely due to muscle strain but due to patient's history of neck fusion, will get x-ray to rule out any fractures or displacement of his neck screws.  Plan: -X-ray right  shoulder and cervical spine -Follow-up with neurosurgeon -OTC Tylenol as needed for pain  Tobacco use disorder Patient reports that he is interested in quitting smoking. States he has quit for 10 years before in the past. At the moment, 1 pack of cigarette lasts him a day and a half. He is not interested in any pills but will consider the nicotine patch.  Plan: -Start nicotine patch 21 mg/24 hours -Reassess at next office visit    See Encounters Tab for problem based charting.  Patient discussed with Dr. Gardiner Sleeper, MD, MPH

## 2022-10-19 NOTE — Progress Notes (Signed)
Subjective:   David Keith is a 67 y.o. male who presents for an Initial Medicare Annual Wellness Visit. I connected with  Hedy Camara on 10/19/22 by a  Face-To-Face  enabled telemedicine application and verified that I am speaking with the correct person using two identifiers.  Patient Location: Other:  Office/Clinic  Provider Location: Office/Clinic  I discussed the limitations of evaluation and management by telemedicine. The patient expressed understanding and agreed to proceed.  Review of Systems    Defer to PCP       Objective:    Today's Vitals   10/19/22 1450  BP: (!) 157/89  Pulse: (!) 111  Temp: 98.3 F (36.8 C)  TempSrc: Oral  SpO2: 96%  Weight: 150 lb 4.8 oz (68.2 kg)  Height: 5\' 9"  (1.753 m)  PainSc: 7    Body mass index is 22.2 kg/m.     10/19/2022    2:53 PM 10/19/2022    2:52 PM 08/29/2022   10:26 AM 08/11/2019    3:28 PM 08/11/2019    9:30 AM 08/07/2019   11:46 AM 03/12/2018    8:00 AM  Advanced Directives  Does Patient Have a Medical Advance Directive? No No No No No No No  Would patient like information on creating a medical advance directive? No - Patient declined No - Patient declined No - Patient declined No - Patient declined No - Patient declined No - Patient declined No - Patient declined    Current Medications (verified) Outpatient Encounter Medications as of 10/19/2022  Medication Sig   aspirin EC 81 MG tablet Take 1 tablet (81 mg total) by mouth 2 (two) times daily.   diclofenac sodium (VOLTAREN) 1 % GEL Apply 2 g topically 4 (four) times daily. (Patient taking differently: Apply 2 g topically 4 (four) times daily as needed (pain.). )   losartan-hydrochlorothiazide (HYZAAR) 50-12.5 MG tablet Take 1 tablet by mouth daily.   Multiple Vitamin (MULTIVITAMIN WITH MINERALS) TABS tablet Take 1 tablet by mouth daily.   No facility-administered encounter medications on file as of 10/19/2022.    Allergies (verified) Flexeril  [cyclobenzaprine] and Other   History: Past Medical History:  Diagnosis Date   Cervical spondylosis with myelopathy and radiculopathy    Chronic back pain    radiculopathy and stenosis   Hepatitis    HEPATITIS C ,  [facility]    Hypertension    takes AMlodipine daily   Joint swelling    right knee   Weakness    numbness in both legs when walking   Past Surgical History:  Procedure Laterality Date   ANTERIOR CERVICAL DECOMP/DISCECTOMY FUSION N/A 03/11/2018   Procedure: ANTERIOR CERVICAL DECOMPRESSION/DISCECTOMY FUSION, INTERBODY PROSTHESIS, PLATE/SCREWS CERVICAL THREE- CERVICAL FOUR;  Surgeon: 03/13/2018, MD;  Location: [facility] OR;  Service: Neurosurgery;  Laterality: N/A;  ANTERIOR CERVICAL DECOMPRESSION/DISCECTOMY FUSION, INTERBODY PROSTHESIS, PLATE/SCREWS CERVICAL THREE- CERVICAL FOUR   COLONOSCOPY     KNEE ARTHROSCOPY Right 2011   LUMBAR SPINE SURGERY  2011   REPORTS 4 BOLTS , 2 SCREWS ; DR  JEFFREY JENKINS AT [place]    TOTAL KNEE ARTHROPLASTY 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   Procedure: RIGHT TOTAL KNEE ARTHROPLASTY;  Surgeon: 08/13/2019, MD;  Location: [facility] ORS;  Service: Orthopedics;  Laterality: Right;   Family History  Problem Relation Age of Onset   Cancer Mother    Heart disease Father    Diabetes Brother    Social History   Socioeconomic History   Marital status:  Married    Spouse name: Not on file   Number of children: Not on file   Years of education: Not on file   Highest education level: Not on file  Occupational History   Not on file  Tobacco Use   Smoking status: Every Day    Packs/day: 0.50    Years: 20.00    Total pack years: 10.00    Types: Cigarettes   Smokeless tobacco: Never   Tobacco comments:    2-3 packs a week  Vaping Use   Vaping Use: Never used  Substance and Sexual Activity   Alcohol use: Yes    Alcohol/week: 15.0 standard drinks of alcohol    Types: 12 Cans of beer, 2 Shots of liquor, 1 Standard drinks or equivalent per week     Comment: Beer and liquor.   Drug use: Not Currently    Types: Marijuana    Comment: Sometimes.   Sexual activity: Not on file  Other Topics Concern   Not on file  Social History Narrative   Not on file   Social Determinants of Health   Financial Resource Strain: Medium Risk (10/19/2022)   Overall Financial Resource Strain (CARDIA)    Difficulty of Paying Living Expenses: Somewhat hard  Food Insecurity: Food Insecurity Present (10/19/2022)   Hunger Vital Sign    Worried About Running Out of Food in the Last Year: Sometimes true    Ran Out of Food in the Last Year: Sometimes true  Transportation Needs: No Transportation Needs (10/19/2022)   PRAPARE - Administrator, Civil Service (Medical): No    Lack of Transportation (Non-Medical): No  Physical Activity: Unknown (10/19/2022)   Exercise Vital Sign    Days of Exercise per Week: 3 days    Minutes of Exercise per Session: Patient refused  Stress: No Stress Concern Present (10/19/2022)   [employer] of Occupational Health - Occupational Stress Questionnaire    Feeling of Stress : Only a little  Social Connections: Moderately Isolated (10/19/2022)   Social Connection and Isolation Panel [NHANES]    Frequency of Communication with Friends and Family: Three times a week    Frequency of Social Gatherings with Friends and Family: More than three times a week    Attends Religious Services: Never    Database administrator or Organizations: No    Attends Engineer, structural: Never    Marital Status: Married    Tobacco Counseling Ready to quit: Not Answered Counseling given: Not Answered Tobacco comments: 2-3 packs a week   Clinical Intake:  Pre-visit preparation completed: Yes  Pain : 0-10 Pain Score: 7  Pain Type: Acute pain Pain Location: Arm Pain Orientation: Right Pain Descriptors / Indicators: Nagging Pain Onset: In the past 7 days Pain Frequency: Constant Pain Relieving Factors: advil  Pain  Relieving Factors: advil  Nutritional Risks: None Diabetes: No  How often do you need to have someone help you when you read instructions, pamphlets, or other written materials from your doctor or pharmacy?: 1 - Never What is the last grade level you completed in school?: 10th grade  Diabetic?No  Interpreter Needed?: No  Information entered by :: Tuwanda Vokes,cma 10/19/22 2:51pm   Activities of Daily Living    10/19/2022    2:52 PM 08/29/2022   10:25 AM  In your present state of health, do you have any difficulty performing the following activities:  Hearing? 0 0  Vision? 0 0  Difficulty concentrating or making  decisions? 0 0  Walking or climbing stairs? 0 0  Dressing or bathing? 0 0  Doing errands, shopping? 0 0    Patient Care Team: Drucie Opitz, MD as PCP - General  Indicate any recent Medical Services you may have received from other than [facility] providers in the past year (date may be approximate).     Assessment:   This is a routine wellness examination for [facility].  Hearing/Vision screen No results found.  Dietary issues and exercise activities discussed:     Goals Addressed   None   Depression Screen    10/19/2022    2:52 PM 08/29/2022   10:25 AM 03/12/2019   10:50 AM 09/02/2018    1:30 PM 08/01/2018   11:01 AM 08/01/2018   10:33 AM 06/11/2018   11:08 AM  PHQ 2/9 Scores  PHQ - 2 Score 0 0 1 0 0 0 0    Fall Risk    10/19/2022    2:52 PM 08/29/2022   10:24 AM 03/12/2019   10:50 AM 09/02/2018    1:30 PM 08/01/2018   11:05 AM  Fall Risk   Falls in the past year? 1 1 0 1 0  Number falls in past yr: 0 1  0   Injury with Fall? 1 0     Risk for fall due to : No Fall Risks Other (Comment)     Risk for fall due to: Comment  Weakness of legs.     Follow up Falls evaluation completed;Falls prevention discussed Falls evaluation completed;Falls prevention discussed       FALL RISK PREVENTION PERTAINING TO THE HOME:  Any stairs in or around the home? No  If  so, are there any without handrails? No  Home free of loose throw rugs in walkways, pet beds, electrical cords, etc? Yes  Adequate lighting in your home to reduce risk of falls? Yes   ASSISTIVE DEVICES UTILIZED TO PREVENT FALLS:  Life alert? No  Use of a cane, walker or w/c? Yes  Grab bars in the bathroom? Yes  Shower chair or bench in shower? No  Elevated toilet seat or a handicapped toilet? No   TIMED UP AND GO:  Was the test performed? Yes .  Length of time to ambulate 10 feet: 1 min.   Gait slow and steady with assistive device  Cognitive Function:        10/19/2022    2:53 PM  6CIT Screen  What Year? 0 points  What month? 0 points  What time? 0 points  Count back from 20 0 points  Months in reverse 0 points    Immunizations Immunization History  Administered Date(s) Administered   PFIZER(Purple Top)SARS-COV-2 Vaccination 01/02/2020, 01/26/2020   Pneumococcal Polysaccharide-23 02/08/2016   Tdap 02/08/2016    TDAP status: Up to date  Flu Vaccine status: Due, Education has been provided regarding the importance of this vaccine. Advised may receive this vaccine at local pharmacy or Health Dept. Aware to provide a copy of the vaccination record if obtained from local pharmacy or Health Dept. Verbalized acceptance and understanding.  Pneumococcal vaccine status: Due, Education has been provided regarding the importance of this vaccine. Advised may receive this vaccine at local pharmacy or Health Dept. Aware to provide a copy of the vaccination record if obtained from local pharmacy or Health Dept. Verbalized acceptance and understanding.  Covid-19 vaccine status: Completed vaccines  Qualifies for Shingles Vaccine? No   Zostavax completed No   Shingrix Completed?: No.  Education has been provided regarding the importance of this vaccine. Patient has been advised to call insurance company to determine out of pocket expense if they have not yet received this vaccine.  Advised may also receive vaccine at local pharmacy or Health Dept. Verbalized acceptance and understanding.  Screening Tests Health Maintenance  Topic Date Due   Zoster Vaccines- Shingrix (1 of 2) Never done   COLONOSCOPY (Pts 45-34yrs Insurance coverage will need to be confirmed)  01/17/2017   Pneumonia Vaccine 71+ Years old (2 - PCV) 02/07/2017   COVID-19 Vaccine (3 - 2023-24 season) 05/26/2022   Medicare Annual Wellness (AWV)  10/20/2023   DTaP/Tdap/Td (2 - Td or Tdap) 02/07/2026   Hepatitis C Screening  Completed   HPV VACCINES  Aged Out    Health Maintenance  Health Maintenance Due  Topic Date Due   Zoster Vaccines- Shingrix (1 of 2) Never done   COLONOSCOPY (Pts 45-59yrs Insurance coverage will need to be confirmed)  01/17/2017   Pneumonia Vaccine 72+ Years old (2 - PCV) 02/07/2017   COVID-19 Vaccine (3 - 2023-24 season) 05/26/2022      Lung Cancer Screening: (Low Dose CT Chest recommended if Age 92-80 years, 30 pack-year currently smoking OR have quit w/in 15years.) does not qualify.   Lung Cancer Screening Referral: N/A  Additional Screening:  Hepatitis C Screening: does not qualify; Completed 02/22/2018  Vision Screening: Recommended annual ophthalmology exams for early detection of glaucoma and other disorders of the eye. Is the patient up to date with their annual eye exam?  Yes  Who is the provider or what is the name of the office in which the patient attends annual eye exams? N/A If pt is not established with a provider, would they like to be referred to a provider to establish care? No .   Dental Screening: Recommended annual dental exams for proper oral hygiene  Community Resource Referral / Chronic Care Management: CRR required this visit?  No   CCM required this visit?  No      Plan:     I have personally reviewed and noted the following in the patient's chart:   Medical and social history Use of alcohol, tobacco or illicit drugs  Current  medications and supplements including opioid prescriptions. Patient is not currently taking opioid prescriptions. Functional ability and status Nutritional status Physical activity Advanced directives List of other physicians Hospitalizations, surgeries, and ER visits in previous 12 months Vitals Screenings to include cognitive, depression, and falls Referrals and appointments  In addition, I have reviewed and discussed with patient certain preventive protocols, quality metrics, and best practice recommendations. A written personalized care plan for preventive services as well as general preventive health recommendations were provided to patient.     Kerin Perna, [place]   10/19/2022   Nurse Notes: Face-To-Face Visit  [name] , Thank you for taking time to come for your Medicare Wellness Visit. I appreciate your ongoing commitment to your health goals. Please review the following plan we discussed and let me know if I can assist you in the future.   These are the goals we discussed:  Goals      Quit Smoking        This is a list of the screening recommended for you and due dates:  Health Maintenance  Topic Date Due   Zoster (Shingles) Vaccine (1 of 2) Never done   Colon Cancer Screening  01/17/2017   Pneumonia Vaccine (2 - PCV) 02/07/2017  COVID-19 Vaccine (3 - 2023-24 season) 05/26/2022   Medicare Annual Wellness Visit  10/20/2023   DTaP/Tdap/Td vaccine (2 - Td or Tdap) 02/07/2026   Hepatitis C Screening: USPSTF Recommendation to screen - Ages 5-79 yo.  Completed   HPV Vaccine  Aged Out

## 2022-10-21 ENCOUNTER — Encounter: Payer: Self-pay | Admitting: Student

## 2022-10-21 DIAGNOSIS — W19XXXA Unspecified fall, initial encounter: Secondary | ICD-10-CM | POA: Insufficient documentation

## 2022-10-21 LAB — BMP8+ANION GAP
Anion Gap: 20 mmol/L — ABNORMAL HIGH (ref 10.0–18.0)
BUN/Creatinine Ratio: 15 (ref 10–24)
BUN: 13 mg/dL (ref 8–27)
CO2: 23 mmol/L (ref 20–29)
Calcium: 10.2 mg/dL (ref 8.6–10.2)
Chloride: 96 mmol/L (ref 96–106)
Creatinine, Ser: 0.87 mg/dL (ref 0.76–1.27)
Glucose: 110 mg/dL — ABNORMAL HIGH (ref 70–99)
Potassium: 3.8 mmol/L (ref 3.5–5.2)
Sodium: 139 mmol/L (ref 134–144)
eGFR: 94 mL/min/{1.73_m2} (ref >59)

## 2022-10-21 MED ORDER — NICOTINE 21 MG/24HR TD PT24: 21.0000 mg | MEDICATED_PATCH | TRANSDERMAL | 1 refills | Status: DC

## 2022-10-21 NOTE — Assessment & Plan Note (Signed)
Patient found to have elevated BP with SBP in the 180s-190s during last office visit about 6 weeks ago in the setting of being out of his amlodipine. Patient was started on Hyzaar 50-12.5 mg daily with plan to follow-up in 1 month. Patient has BP today initially elevated with SBP in the 150s but repeat down to 110s. His diastolic BP is elevated at 94. Patient's BP improved but still not at goal.  Patient currently with some pain from his recent fall so we will follow-up in 1 month to reassess BP.  Patient counseled on importance of smoking cessation on BP control.  BMP today shows normal kidney function and no electrolyte abnormalities. Vitals:   10/19/22 1320 10/19/22 1400  BP: (!) 157/89 (!) 119/94   Plan: -Continue Hyzaar 50-12.5 mg daily -Check BP at home, bring log to next office visit -1 month follow-up for BP check

## 2022-10-21 NOTE — Assessment & Plan Note (Signed)
Patient reports that he is interested in quitting smoking. States he has quit for 10 years before in the past. At the moment, 1 pack of cigarette lasts him a day and a half. He is not interested in any pills but will consider the nicotine patch.  Plan: -Start nicotine patch 21 mg/24 hours -Reassess at next office visit

## 2022-10-21 NOTE — Assessment & Plan Note (Signed)
Patient reports that over the weekend, after watching football, he tripped and fell on a water bottle. He denies hitting his head but reports falling on his right shoulder and neck. He started having shoulder and neck pain the next day described as throbbing and shooting down his right arm but denies any weakness, numbness, headaches or blurry vision. The pain has improved slightly but it remains intermittent. 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 On exam, patient does have some mild tenderness to palpation of the right shoulder and neck but has normal range of motion of the shoulder and the neck. Symptoms likely due to muscle strain but due to patient's history of neck fusion, will get x-ray to rule out any fractures or displacement of his neck screws.  Plan: -X-ray right shoulder and cervical spine -Follow-up with neurosurgeon -OTC Tylenol as needed for pain

## 2022-10-30 NOTE — Progress Notes (Signed)
Internal Medicine Clinic Attending  Case discussed with Dr. Amponsah  At the time of the visit.  We reviewed the resident's history and exam and pertinent patient test results.  I agree with the assessment, diagnosis, and plan of care documented in the resident's note.  

## 2022-10-30 NOTE — Progress Notes (Signed)
Internal Medicine Clinic Attending  Case and documentation of Dr. Amponsah  at the time of the visit reviewed.  I reviewed the AWV findings.  I agree with the assessment, diagnosis, and plan of care documented in the AWV note.     

## 2023-04-19 DIAGNOSIS — H524 Presbyopia: Secondary | ICD-10-CM | POA: Diagnosis not present

## 2023-11-21 ENCOUNTER — Ambulatory Visit: Payer: Medicare Other

## 2023-11-21 VITALS — Ht 69.0 in | Wt 151.0 lb

## 2023-11-21 DIAGNOSIS — Z0000 Encounter for general adult medical examination without abnormal findings: Secondary | ICD-10-CM

## 2023-11-21 NOTE — Progress Notes (Signed)
 Subjective:   SHAMIR TUZZOLINO is a 68 y.o. who presents for a Medicare Wellness preventive visit.  Visit Complete: Virtual I connected with  Hedy Camara on 11/21/23 by a audio enabled telemedicine application and verified that I am speaking with the correct person using two identifiers.  Patient Location: Home  Provider Location: Office/Clinic  I discussed the limitations of evaluation and management by telemedicine. The patient expressed understanding and agreed to proceed.  Vital Signs: Because this visit was a virtual/telehealth visit, some criteria may be missing or patient reported. Any vitals not documented were not able to be obtained and vitals that have been documented are patient reported.  VideoDeclined- This patient declined Librarian, academic. Therefore the visit was completed with audio only.  AWV Questionnaire: No: Patient Medicare AWV questionnaire was not completed prior to this visit.  Cardiac Risk Factors include: advanced age (>31men, >34 women);dyslipidemia;family history of premature cardiovascular disease;hypertension;male gender     Objective:    Today's Vitals   11/21/23 1002  Weight: 151 lb (68.5 kg)  Height: 5\' 9"  (1.753 m)  PainSc: 0-No pain   Body mass index is 22.3 kg/m.     11/21/2023   10:06 AM 10/19/2022    2:53 PM 10/19/2022    2:52 PM 08/29/2022   10:26 AM 08/11/2019    3:28 PM 08/11/2019    9:30 AM 08/07/2019   11:46 AM  Advanced Directives  Does Patient Have a Medical Advance Directive? No No No No No No No  Would patient like information on creating a medical advance directive? No - Patient declined No - Patient declined No - Patient declined No - Patient declined No - Patient declined No - Patient declined No - Patient declined    Current Medications (verified) Outpatient Encounter Medications as of 11/21/2023  Medication Sig   aspirin EC 81 MG tablet Take 1 tablet (81 mg total) by mouth 2 (two)  times daily.   losartan-hydrochlorothiazide (HYZAAR) 50-12.5 MG tablet Take 1 tablet by mouth daily.   Multiple Vitamin (MULTIVITAMIN WITH MINERALS) TABS tablet Take 1 tablet by mouth daily.   diclofenac sodium (VOLTAREN) 1 % GEL Apply 2 g topically 4 (four) times daily. (Patient not taking: Reported on 11/21/2023)   nicotine (NICODERM CQ) 21 mg/24hr patch Place 1 patch (21 mg total) onto the skin daily. (Patient not taking: Reported on 11/21/2023)   No facility-administered encounter medications on file as of 11/21/2023.    Allergies (verified) Flexeril [cyclobenzaprine] and Other   History: Past Medical History:  Diagnosis Date   Cervical spondylosis with myelopathy and radiculopathy    Chronic back pain    radiculopathy and stenosis   Hepatitis    HEPATITIS C ,  [facility]    Hypertension    takes AMlodipine daily   Joint swelling    right knee   Weakness    numbness in both legs when walking   Past Surgical History:  Procedure Laterality Date   ANTERIOR CERVICAL DECOMP/DISCECTOMY FUSION N/A 03/11/2018   Procedure: ANTERIOR CERVICAL DECOMPRESSION/DISCECTOMY FUSION, INTERBODY PROSTHESIS, PLATE/SCREWS CERVICAL THREE- CERVICAL FOUR;  Surgeon: Tressie Stalker, MD;  Location: [facility] OR;  Service: Neurosurgery;  Laterality: N/A;  ANTERIOR CERVICAL DECOMPRESSION/DISCECTOMY FUSION, INTERBODY PROSTHESIS, PLATE/SCREWS CERVICAL THREE- CERVICAL FOUR   COLONOSCOPY     KNEE ARTHROSCOPY Right 2011   LUMBAR SPINE SURGERY  2011   REPORTS 4 BOLTS , 2 SCREWS ; DR  JEFFREY JENKINS AT [place]    TOTAL  KNEE ARTHROPLASTY 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   Procedure: RIGHT TOTAL KNEE ARTHROPLASTY;  Surgeon: Gean Birchwood, MD;  Location: [facility] ORS;  Service: Orthopedics;  Laterality: Right;   Family History  Problem Relation Age of Onset   Cancer Mother    Heart disease Father    Diabetes Brother    Social History   Socioeconomic History   Marital status: Married    Spouse name: Not on file   Number of  children: Not on file   Years of education: Not on file   Highest education level: High school graduate  Occupational History   Not on file  Tobacco Use   Smoking status: Every Day    Current packs/day: 0.50    Average packs/day: 0.5 packs/day for 20.0 years (10.0 ttl pk-yrs)    Types: Cigarettes   Smokeless tobacco: Never   Tobacco comments:    2-3 packs a week  Vaping Use   Vaping status: Never Used  Substance and Sexual Activity   Alcohol use: Yes    Alcohol/week: 15.0 standard drinks of alcohol    Types: 12 Cans of beer, 2 Shots of liquor, 1 Standard drinks or equivalent per week    Comment: Beer and liquor.   Drug use: Not Currently    Types: Marijuana    Comment: Sometimes.   Sexual activity: Not on file  Other Topics Concern   Not on file  Social History Narrative   Not on file   Social Drivers of Health   Financial Resource Strain: Medium Risk (11/21/2023)   Overall Financial Resource Strain (CARDIA)    Difficulty of Paying Living Expenses: Somewhat hard  Food Insecurity: No Food Insecurity (11/21/2023)   Hunger Vital Sign    Worried About Running Out of Food in the Last Year: Never true    Ran Out of Food in the Last Year: Never true  Transportation Needs: No Transportation Needs (11/21/2023)   PRAPARE - Administrator, Civil Service (Medical): No    Lack of Transportation (Non-Medical): No  Physical Activity: Sufficiently Active (11/21/2023)   Exercise Vital Sign    Days of Exercise per Week: 7 days    Minutes of Exercise per Session: 30 min  Stress: No Stress Concern Present (11/21/2023)   [employer] of Occupational Health - Occupational Stress Questionnaire    Feeling of Stress : Only a little  Social Connections: Moderately Isolated (11/21/2023)   Social Connection and Isolation Panel [NHANES]    Frequency of Communication with Friends and Family: Three times a week    Frequency of Social Gatherings with Friends and Family: More than three  times a week    Attends Religious Services: Never    Database administrator or Organizations: No    Attends Engineer, structural: Never    Marital Status: Married    Tobacco Counseling Ready to quit: Not Answered Counseling given: Not Answered Tobacco comments: 2-3 packs a week    Clinical Intake:  Pre-visit preparation completed: Yes  Pain : No/denies pain Pain Score: 0-No pain     BMI - recorded: 22.3 Nutritional Status: BMI of 19-24  Normal Nutritional Risks: None Diabetes: No  How often do you need to have someone help you when you read instructions, pamphlets, or other written materials from your doctor or pharmacy?: 1 - Never What is the last grade level you completed in school?: 10TH GRADE  Interpreter Needed?: No  Information entered by :: Guilherme Schwenke N. Detrich Rakestraw, LPN.  Activities of Daily Living     11/21/2023   10:11 AM  In your present state of health, do you have any difficulty performing the following activities:  Hearing? 0  Vision? 0  Difficulty concentrating or making decisions? 0  Walking or climbing stairs? 0  Dressing or bathing? 0  Doing errands, shopping? 0  Preparing Food and eating ? N  Using the Toilet? N  In the past six months, have you accidently leaked urine? N  Do you have problems with loss of bowel control? N  Managing your Medications? N  Managing your Finances? N  Housekeeping or managing your Housekeeping? N    Patient Care Team: Olegario Messier, MD as PCP - General Ander Purpura, OD as Consulting Physician (Optometry)  Indicate any recent Medical Services you may have received from other than [facility] providers in the past year (date may be approximate).     Assessment:   This is a routine wellness examination for [facility].  Hearing/Vision screen Hearing Screening - Comments:: Denies hearing difficulties. No hearing aids.  Vision Screening - Comments:: Wears rx glasses - up to date with routine eye exams with Ander Purpura, OD.    Goals Addressed             This Visit's Progress    Quit Smoking       Quit Smoking Goals: Manage cravings and triggers by practicing mindfulness and relaxation techniques. Create a personalized quit plan. Enlist support from friends and family. Reward yourself for accomplishments. Find alternative activities. Make healthier choices. Prepare for difficult situations. Identify reasons to quit.       Depression Screen     11/21/2023   10:11 AM 10/19/2022    2:52 PM 08/29/2022   10:25 AM 03/12/2019   10:50 AM 09/02/2018    1:30 PM 08/01/2018   11:01 AM 08/01/2018   10:33 AM  PHQ 2/9 Scores  PHQ - 2 Score 0 0 0 1 0 0 0  PHQ- 9 Score 0          Fall Risk     11/21/2023   10:07 AM 10/19/2022    2:52 PM 08/29/2022   10:24 AM 03/12/2019   10:50 AM 09/02/2018    1:30 PM  Fall Risk   Falls in the past year? 0 1 1 0 1  Number falls in past yr: 0 0 1  0  Injury with Fall? 0 1 0    Risk for fall due to : No Fall Risks No Fall Risks Other (Comment)    Risk for fall due to: Comment   Weakness of legs.    Follow up Falls prevention discussed;Falls evaluation completed Falls evaluation completed;Falls prevention discussed Falls evaluation completed;Falls prevention discussed      MEDICARE RISK AT HOME:  Medicare Risk at Home Any stairs in or around the home?: Yes (outside) If so, are there any without handrails?: No Home free of loose throw rugs in walkways, pet beds, electrical cords, etc?: Yes Adequate lighting in your home to reduce risk of falls?: Yes Life alert?: No Use of a cane, walker or w/c?: Yes Grab bars in the bathroom?: No (very careful) Shower chair or bench in shower?: No Elevated toilet seat or a handicapped toilet?: No  TIMED UP AND GO:  Was the test performed?  No  Cognitive Function: 6CIT completed    11/21/2023   10:10 AM  MMSE - Mini Mental State Exam  Not completed: Unable to complete  11/21/2023   10:10 AM 10/19/2022     2:53 PM  6CIT Screen  What Year? 0 points 0 points  What month? 0 points 0 points  What time? 0 points 0 points  Count back from 20 0 points 0 points  Months in reverse 0 points 0 points  Repeat phrase 0 points   Total Score 0 points     Immunizations Immunization History  Administered Date(s) Administered   PFIZER(Purple Top)SARS-COV-2 Vaccination 01/02/2020, 01/26/2020   PNEUMOCOCCAL CONJUGATE-20 10/19/2022   Pneumococcal Polysaccharide-23 02/08/2016   Tdap 02/08/2016    Screening Tests Health Maintenance  Topic Date Due   Zoster Vaccines- Shingrix (1 of 2) Never done   Colonoscopy  01/17/2017   COVID-19 Vaccine (3 - 2024-25 season) 05/27/2023   Medicare Annual Wellness (AWV)  11/20/2024   DTaP/Tdap/Td (2 - Td or Tdap) 02/07/2026   Pneumonia Vaccine 42+ Years old  Completed   Hepatitis C Screening  Completed   HPV VACCINES  Aged Out    Health Maintenance  Health Maintenance Due  Topic Date Due   Zoster Vaccines- Shingrix (1 of 2) Never done   Colonoscopy  01/17/2017   COVID-19 Vaccine (3 - 2024-25 season) 05/27/2023   Health Maintenance Items Addressed: Yes Patient stated that he has a stool card/Cologuard kit.  Patient advised that he is due for Covid-19 and Shingrix vaccine.  Additional Screening:  Vision Screening: Recommended annual ophthalmology exams for early detection of glaucoma and other disorders of the eye.  Dental Screening: Recommended annual dental exams for proper oral hygiene  Community Resource Referral / Chronic Care Management: CRR required this visit?  No   CCM required this visit?  No     Plan:     I have personally reviewed and noted the following in the patient's chart:   Medical and social history Use of alcohol, tobacco or illicit drugs  Current medications and supplements including opioid prescriptions. Patient is not currently taking opioid prescriptions. Functional ability and status Nutritional status Physical  activity Advanced directives List of other physicians Hospitalizations, surgeries, and ER visits in previous 12 months Vitals Screenings to include cognitive, depression, and falls Referrals and appointments  In addition, I have reviewed and discussed with patient certain preventive protocols, quality metrics, and best practice recommendations. A written personalized care plan for preventive services as well as general preventive health recommendations were provided to patient.     Mickeal Needy, LPN   9/81/1914   After Visit Summary: (Declined) Due to this being a telephonic visit, with patients personalized plan was offered to patient but patient Declined AVS at this time   Notes: Please refer to Routing Comments.

## 2023-11-21 NOTE — Patient Instructions (Addendum)
 David Keith , Thank you for taking time to come for your Medicare Wellness Visit. I appreciate your ongoing commitment to your health goals. Please review the following plan we discussed and let me know if I can assist you in the future.   Referrals/Orders/Follow-Ups/Clinician Recommendations: Yes; Keep maintaining your health by keeping your appointments with Dr. Thomasene Ripple and any specialists that you may see.  Call us if you need anything.  Have a great year!!!!  This is a list of the screening recommended for you and due dates:  Health Maintenance  Topic Date Due   Zoster (Shingles) Vaccine (1 of 2) Never done   Colon Cancer Screening  01/17/2017   COVID-19 Vaccine (3 - 2024-25 season) 05/27/2023   Medicare Annual Wellness Visit  11/20/2024   DTaP/Tdap/Td vaccine (2 - Td or Tdap) 02/07/2026   Pneumonia Vaccine  Completed   Hepatitis C Screening  Completed   HPV Vaccine  Aged Out    Advanced directives: (Declined) Advance directive discussed with you today. Even though you declined this today, please call our office should you change your mind, and we can give you the proper paperwork for you to fill out.  Next Medicare Annual Wellness Visit scheduled for next year: Yes

## 2023-12-20 ENCOUNTER — Ambulatory Visit: Admitting: Student

## 2023-12-20 VITALS — BP 161/88 | HR 86 | Temp 98.2°F | Ht 69.0 in | Wt 149.0 lb

## 2023-12-20 DIAGNOSIS — F17210 Nicotine dependence, cigarettes, uncomplicated: Secondary | ICD-10-CM | POA: Diagnosis not present

## 2023-12-20 DIAGNOSIS — I10 Essential (primary) hypertension: Secondary | ICD-10-CM | POA: Diagnosis not present

## 2023-12-20 DIAGNOSIS — F17200 Nicotine dependence, unspecified, uncomplicated: Secondary | ICD-10-CM

## 2023-12-20 DIAGNOSIS — F1010 Alcohol abuse, uncomplicated: Secondary | ICD-10-CM

## 2023-12-20 MED ORDER — LOSARTAN POTASSIUM-HCTZ 100-12.5 MG PO TABS: 1.0000 | ORAL_TABLET | Freq: Every day | ORAL | 3 refills | Status: AC

## 2023-12-20 NOTE — Patient Instructions (Signed)
 Thank you so much for coming to the clinic today!   For your blood pressure, we are increasing your medication today, I have sent it in to the pharmacy. The next time you're here, I want you to think of a date to quit smoking cigarettes, and help keep yourself accountable for it!  If you have any questions please feel free to the call the clinic at anytime at [phone]. It was a pleasure seeing you!  Best, Dr. Thomasene Ripple

## 2023-12-20 NOTE — Assessment & Plan Note (Addendum)
 Patient endorses drinking a 12 pack of beer a week, paired with 1/5 of vodka which last him about 2 to 3 days.  He has been doing this for quite some time now, I explained to him the long-term risks of alcohol use.  He does not think that this is a problem for him, and is never felt like he is dependent on the alcohol, and he has never gone through withdrawals.  I offered him naltrexone, to help reduce the cravings, however patient declined.  He is interested in decreasing the amount he drinks, so he stated that he will cut down to 6 beers weekly by the time of next appointment.  Of note, he does have evidence of liver damage based off of his previous labs with elevated AST and ALT.  Plan: - Check CMP today - Follow-up with the patient if he was able to cut down

## 2023-12-20 NOTE — Assessment & Plan Note (Signed)
 Patient is still smoking a pack every 2 days, and has been doing this for years.  He is interested in quitting before 68th birthday which is [birth year].  He has tried nicotine patches before, and he says that this does not work.  I offered him Chantix however he does not want to take more medications.  We have come to an agreement, at his next appointment in a month to follow-up his blood pressure, he will have a quit date.

## 2023-12-20 NOTE — Assessment & Plan Note (Signed)
 Blood pressure elevated today in the clinic at 172/90 and on recheck 161/88.  He states that he has been taking his Hyzaar, although he did not take it today.  On chart review, it seems like his prescription had expired for what ever reason, so we will refill today.  He does check his blood pressure at home and is usually in the 130s to 140s systolic range.  I think he would benefit from an increase of his medication, so we will increase Hyzaar to 100/12.5.  Plan:  - Increase Hyzaar to 100-12.5

## 2023-12-20 NOTE — Progress Notes (Signed)
 CC: General follow up  HPI:  David Keith is a 68 y.o. male living with a history stated below and presents today for follow up for his chronic conditions. Please see problem based assessment and plan for additional details.  Past Medical History:  Diagnosis Date   Cervical spondylosis with myelopathy and radiculopathy    Chronic back pain    radiculopathy and stenosis   Hepatitis    HEPATITIS C ,  [facility]ICKLE CELL CLINIC    Hypertension    takes AMlodipine daily   Joint swelling    right knee   Weakness    numbness in both legs when walking    Current Outpatient Medications on File Prior to Visit  Medication Sig Dispense Refill   diclofenac sodium (VOLTAREN) 1 % GEL Apply 2 g topically 4 (four) times daily. (Patient not taking: Reported on 11/21/2023) 150 g 2   Multiple Vitamin (MULTIVITAMIN WITH MINERALS) TABS tablet Take 1 tablet by mouth daily.     No current facility-administered medications on file prior to visit.    Family History  Problem Relation Age of Onset   Cancer Mother    Heart disease Father    Diabetes Brother     Social History   Socioeconomic History   Marital status: Married    Spouse name: Not on file   Number of children: Not on file   Years of education: Not on file   Highest education level: High school graduate  Occupational History   Not on file  Tobacco Use   Smoking status: Every Day    Current packs/day: 0.50    Average packs/day: 0.5 packs/day for 20.0 years (10.0 ttl pk-yrs)    Types: Cigarettes   Smokeless tobacco: Never   Tobacco comments:    2-3 packs a week  Vaping Use   Vaping status: Never Used  Substance and Sexual Activity   Alcohol use: Yes    Alcohol/week: 15.0 standard drinks of alcohol    Types: 12 Cans of beer, 2 Shots of liquor, 1 Standard drinks or equivalent per week    Comment: Beer and liquor.   Drug use: Not Currently    Types: Marijuana    Comment: Sometimes.   Sexual activity: Not on file  Other  Topics Concern   Not on file  Social History Narrative   Not on file   Social Drivers of Health   Financial Resource Strain: Medium Risk (11/21/2023)   Overall Financial Resource Strain (CARDIA)    Difficulty of Paying Living Expenses: Somewhat hard  Food Insecurity: No Food Insecurity (11/21/2023)   Hunger Vital Sign    Worried About Running Out of Food in the Last Year: Never true    Ran Out of Food in the Last Year: Never true  Transportation Needs: No Transportation Needs (11/21/2023)   PRAPARE - Administrator, Civil Service (Medical): No    Lack of Transportation (Non-Medical): No  Physical Activity: Sufficiently Active (11/21/2023)   Exercise Vital Sign    Days of Exercise per Week: 7 days    Minutes of Exercise per Session: 30 min  Stress: No Stress Concern Present (11/21/2023)   [employer] of Occupational Health - Occupational Stress Questionnaire    Feeling of Stress : Only a little  Social Connections: Moderately Isolated (11/21/2023)   Social Connection and Isolation Panel [NHANES]    Frequency of Communication with Friends and Family: Three times a week    Frequency of Social  Gatherings with Friends and Family: More than three times a week    Attends Religious Services: Never    Database administrator or Organizations: No    Attends Banker Meetings: Never    Marital Status: Married  Catering manager Violence: Not At Risk (11/21/2023)   Humiliation, Afraid, Rape, and Kick questionnaire    Fear of Current or Ex-Partner: No    Emotionally Abused: No    Physically Abused: No    Sexually Abused: No    Review of Systems: ROS negative except for what is noted on the assessment and plan.  Vitals:   12/20/23 1437 12/20/23 1504  BP: (!) 172/90 (!) 161/88  Pulse: 88 86  Temp: 98.2 F (36.8 C)   TempSrc: Oral   SpO2: 98%   Weight: 149 lb (67.6 kg)   Height: 5\' 9"  (1.753 m)     Physical Exam: Constitutional: well-appearing male  in no  acute distress Cardiovascular: regular rate and rhythm, no m/r/g Pulmonary/Chest: normal work of breathing on room air, lungs clear to auscultation bilaterally Abdominal: soft, non-tender, non-distended Neurological: alert & oriented x 3, 5/5 strength in bilateral upper and lower extremities, walks with cane   Assessment & Plan:   ETOH abuse Patient endorses drinking a 12 pack of beer a week, paired with 1/5 of vodka which last him about 2 to 3 days.  He has been doing this for quite some time now, I explained to him the long-term risks of alcohol use.  He does not think that this is a problem for him, and is never felt like he is dependent on the alcohol, and he has never gone through withdrawals.  I offered him naltrexone, to help reduce the cravings, however patient declined.  He is interested in decreasing the amount he drinks, so he stated that he will cut down to 6 beers weekly by the time of next appointment.  Of note, he does have evidence of liver damage based off of his previous labs with elevated AST and ALT.  Plan: - Check CMP today - Follow-up with the patient if he was able to cut down  Tobacco use disorder Patient is still smoking a pack every 2 days, and has been doing this for years.  He is interested in quitting before 68th birthday which is [birth year].  He has tried nicotine patches before, and he says that this does not work.  I offered him Chantix however he does not want to take more medications.  We have come to an agreement, at his next appointment in a month to follow-up his blood pressure, he will have a quit date.  Hypertension Blood pressure elevated today in the clinic at 172/90 and on recheck 161/88.  He states that he has been taking his Hyzaar, although he did not take it today.  On chart review, it seems like his prescription had expired for what ever reason, so we will refill today.  He does check his blood pressure at home and is usually in the 130s to 140s  systolic range.  I think he would benefit from an increase of his medication, so we will increase Hyzaar to 100/12.5.  Plan:  - Increase Hyzaar to 100-12.5   Patient discussed with Dr. Gwendlyn Deutscher, M.D. [facility] Internal Medicine, PGY-2 Pager: [phone] Date 12/20/2023 Time 4:30 PM

## 2023-12-21 LAB — CMP14 + ANION GAP
ALT: 34 IU/L (ref 0–44)
AST: 58 IU/L — ABNORMAL HIGH (ref 0–40)
Albumin: 4.4 g/dL (ref 3.9–4.9)
Alkaline Phosphatase: 87 IU/L (ref 44–121)
Anion Gap: 18 mmol/L (ref 10.0–18.0)
BUN/Creatinine Ratio: 13 (ref 10–24)
BUN: 10 mg/dL (ref 8–27)
Bilirubin Total: 0.3 mg/dL (ref 0.0–1.2)
CO2: 23 mmol/L (ref 20–29)
Calcium: 9.1 mg/dL (ref 8.6–10.2)
Chloride: 102 mmol/L (ref 96–106)
Creatinine, Ser: 0.76 mg/dL (ref 0.76–1.27)
Globulin, Total: 2.5 g/dL (ref 1.5–4.5)
Glucose: 88 mg/dL (ref 70–99)
Potassium: 3.9 mmol/L (ref 3.5–5.2)
Sodium: 143 mmol/L (ref 134–144)
Total Protein: 6.9 g/dL (ref 6.0–8.5)
eGFR: 97 mL/min/{1.73_m2} (ref >59)

## 2023-12-21 LAB — LIPID PANEL
Chol/HDL Ratio: 2 ratio (ref 0.0–5.0)
Cholesterol, Total: 155 mg/dL (ref 100–199)
HDL: 76 mg/dL (ref >39)
LDL Chol Calc (NIH): 59 mg/dL (ref 0–99)
Triglycerides: 114 mg/dL (ref 0–149)
VLDL Cholesterol Cal: 20 mg/dL (ref 5–40)

## 2023-12-21 NOTE — Progress Notes (Signed)
 Internal Medicine Clinic Attending  Case discussed with the resident at the time of the visit.  We reviewed the resident's history and exam and pertinent patient test results.  I agree with the assessment, diagnosis, and plan of care documented in the resident's note.

## 2024-05-26 DIAGNOSIS — H524 Presbyopia: Secondary | ICD-10-CM | POA: Diagnosis not present

## 2024-05-26 DIAGNOSIS — H43393 Other vitreous opacities, bilateral: Secondary | ICD-10-CM | POA: Diagnosis not present

## 2024-11-26 ENCOUNTER — Ambulatory Visit: Payer: Medicare Other
